# Patient Record
Sex: Female | Born: 1945 | Hispanic: Yes | Smoking: Never smoker
Health system: Southern US, Community
[De-identification: ages and names within clinical notes are randomized; demographics above are authoritative.]

## PROBLEM LIST (undated history)

## (undated) DIAGNOSIS — K8689 Other specified diseases of pancreas: Secondary | ICD-10-CM

## (undated) DIAGNOSIS — A0472 Enterocolitis due to Clostridium difficile, not specified as recurrent: Secondary | ICD-10-CM

## (undated) DIAGNOSIS — B269 Mumps without complication: Secondary | ICD-10-CM

## (undated) DIAGNOSIS — Z8489 Family history of other specified conditions: Secondary | ICD-10-CM

## (undated) DIAGNOSIS — M109 Gout, unspecified: Secondary | ICD-10-CM

## (undated) DIAGNOSIS — E039 Hypothyroidism, unspecified: Secondary | ICD-10-CM

## (undated) DIAGNOSIS — L719 Rosacea, unspecified: Secondary | ICD-10-CM

## (undated) DIAGNOSIS — Z9889 Other specified postprocedural states: Secondary | ICD-10-CM

## (undated) DIAGNOSIS — R7303 Prediabetes: Secondary | ICD-10-CM

## (undated) DIAGNOSIS — K5792 Diverticulitis of intestine, part unspecified, without perforation or abscess without bleeding: Principal | ICD-10-CM

## (undated) DIAGNOSIS — K219 Gastro-esophageal reflux disease without esophagitis: Secondary | ICD-10-CM

## (undated) DIAGNOSIS — E785 Hyperlipidemia, unspecified: Secondary | ICD-10-CM

## (undated) DIAGNOSIS — H269 Unspecified cataract: Secondary | ICD-10-CM

## (undated) DIAGNOSIS — M3500 Sicca syndrome, unspecified: Secondary | ICD-10-CM

## (undated) DIAGNOSIS — M1990 Unspecified osteoarthritis, unspecified site: Secondary | ICD-10-CM

## (undated) DIAGNOSIS — H04129 Dry eye syndrome of unspecified lacrimal gland: Secondary | ICD-10-CM

## (undated) DIAGNOSIS — T8859XA Other complications of anesthesia, initial encounter: Secondary | ICD-10-CM

## (undated) DIAGNOSIS — I10 Essential (primary) hypertension: Secondary | ICD-10-CM

## (undated) DIAGNOSIS — D649 Anemia, unspecified: Secondary | ICD-10-CM

## (undated) DIAGNOSIS — M419 Scoliosis, unspecified: Secondary | ICD-10-CM

## (undated) DIAGNOSIS — B54 Unspecified malaria: Secondary | ICD-10-CM

## (undated) DIAGNOSIS — H409 Unspecified glaucoma: Secondary | ICD-10-CM

## (undated) DIAGNOSIS — K449 Diaphragmatic hernia without obstruction or gangrene: Secondary | ICD-10-CM

## (undated) DIAGNOSIS — R112 Nausea with vomiting, unspecified: Secondary | ICD-10-CM

## (undated) HISTORY — PX: OTHER SURGICAL HISTORY: SHX169

## (undated) HISTORY — PX: JOINT REPLACEMENT: SHX530

## (undated) HISTORY — DX: Unspecified cataract: H26.9

## (undated) HISTORY — PX: CHOLECYSTECTOMY: SHX55

## (undated) HISTORY — DX: Enterocolitis due to Clostridium difficile, not specified as recurrent: A04.72

## (undated) HISTORY — DX: Hyperlipidemia, unspecified: E78.5

## (undated) HISTORY — PX: EYE SURGERY: SHX253

## (undated) HISTORY — DX: Diaphragmatic hernia without obstruction or gangrene: K44.9

## (undated) HISTORY — PX: CATARACT EXTRACTION: SUR2

## (undated) HISTORY — DX: Gastro-esophageal reflux disease without esophagitis: K21.9

---

## 1984-03-21 DIAGNOSIS — K759 Inflammatory liver disease, unspecified: Secondary | ICD-10-CM

## 1984-03-21 HISTORY — DX: Inflammatory liver disease, unspecified: K75.9

## 1986-03-21 DIAGNOSIS — G510 Bell's palsy: Secondary | ICD-10-CM

## 1986-03-21 HISTORY — DX: Bell's palsy: G51.0

## 1993-03-21 HISTORY — PX: ABDOMINAL HYSTERECTOMY: SHX81

## 1993-03-21 HISTORY — PX: APPENDECTOMY: SHX54

## 2010-03-11 ENCOUNTER — Encounter
Admission: RE | Admit: 2010-03-11 | Discharge: 2010-03-11 | Payer: Self-pay | Source: Home / Self Care | Attending: Orthopedic Surgery | Admitting: Orthopedic Surgery

## 2010-03-21 HISTORY — PX: JOINT REPLACEMENT: SHX530

## 2010-12-27 HISTORY — PX: KNEE ARTHROPLASTY: SHX992

## 2011-03-22 HISTORY — PX: CHOLECYSTECTOMY: SHX55

## 2012-01-16 HISTORY — PX: CHOLECYSTECTOMY: SHX55

## 2012-03-21 HISTORY — PX: OTHER SURGICAL HISTORY: SHX169

## 2012-11-27 HISTORY — PX: OTHER SURGICAL HISTORY: SHX169

## 2014-02-05 HISTORY — PX: OTHER SURGICAL HISTORY: SHX169

## 2015-08-10 HISTORY — PX: KNEE ARTHROPLASTY: SHX992

## 2016-09-05 DIAGNOSIS — R109 Unspecified abdominal pain: Secondary | ICD-10-CM | POA: Diagnosis not present

## 2016-09-05 DIAGNOSIS — K5909 Other constipation: Secondary | ICD-10-CM | POA: Diagnosis not present

## 2016-09-05 DIAGNOSIS — Z1389 Encounter for screening for other disorder: Secondary | ICD-10-CM | POA: Diagnosis not present

## 2016-09-05 DIAGNOSIS — M1990 Unspecified osteoarthritis, unspecified site: Secondary | ICD-10-CM | POA: Diagnosis not present

## 2016-09-05 DIAGNOSIS — E038 Other specified hypothyroidism: Secondary | ICD-10-CM | POA: Diagnosis not present

## 2016-09-05 DIAGNOSIS — Z6836 Body mass index (BMI) 36.0-36.9, adult: Secondary | ICD-10-CM | POA: Diagnosis not present

## 2016-09-05 DIAGNOSIS — Z79890 Hormone replacement therapy (postmenopausal): Secondary | ICD-10-CM | POA: Diagnosis not present

## 2016-09-15 DIAGNOSIS — M7751 Other enthesopathy of right foot: Secondary | ICD-10-CM | POA: Diagnosis not present

## 2016-09-15 DIAGNOSIS — M659 Synovitis and tenosynovitis, unspecified: Secondary | ICD-10-CM | POA: Diagnosis not present

## 2016-10-24 DIAGNOSIS — H401131 Primary open-angle glaucoma, bilateral, mild stage: Secondary | ICD-10-CM | POA: Diagnosis not present

## 2016-10-24 DIAGNOSIS — H524 Presbyopia: Secondary | ICD-10-CM | POA: Diagnosis not present

## 2016-10-31 DIAGNOSIS — H10413 Chronic giant papillary conjunctivitis, bilateral: Secondary | ICD-10-CM | POA: Diagnosis not present

## 2016-10-31 DIAGNOSIS — H04123 Dry eye syndrome of bilateral lacrimal glands: Secondary | ICD-10-CM | POA: Diagnosis not present

## 2016-11-15 DIAGNOSIS — H04123 Dry eye syndrome of bilateral lacrimal glands: Secondary | ICD-10-CM | POA: Diagnosis not present

## 2016-11-29 DIAGNOSIS — H10413 Chronic giant papillary conjunctivitis, bilateral: Secondary | ICD-10-CM | POA: Diagnosis not present

## 2016-11-29 DIAGNOSIS — H04123 Dry eye syndrome of bilateral lacrimal glands: Secondary | ICD-10-CM | POA: Diagnosis not present

## 2016-12-21 DIAGNOSIS — L600 Ingrowing nail: Secondary | ICD-10-CM | POA: Diagnosis not present

## 2016-12-27 DIAGNOSIS — H10413 Chronic giant papillary conjunctivitis, bilateral: Secondary | ICD-10-CM | POA: Diagnosis not present

## 2016-12-27 DIAGNOSIS — H04123 Dry eye syndrome of bilateral lacrimal glands: Secondary | ICD-10-CM | POA: Diagnosis not present

## 2017-01-23 ENCOUNTER — Other Ambulatory Visit: Payer: Self-pay | Admitting: Internal Medicine

## 2017-01-23 DIAGNOSIS — Z139 Encounter for screening, unspecified: Secondary | ICD-10-CM

## 2017-01-26 ENCOUNTER — Ambulatory Visit
Admission: RE | Admit: 2017-01-26 | Discharge: 2017-01-26 | Disposition: A | Discharge status: Home / Self Care | Payer: Medicare HMO | Source: Ambulatory Visit | Attending: Internal Medicine | Admitting: Internal Medicine

## 2017-01-26 DIAGNOSIS — Z139 Encounter for screening, unspecified: Secondary | ICD-10-CM

## 2017-01-26 DIAGNOSIS — Z1231 Encounter for screening mammogram for malignant neoplasm of breast: Secondary | ICD-10-CM | POA: Diagnosis not present

## 2017-02-17 DIAGNOSIS — E7849 Other hyperlipidemia: Secondary | ICD-10-CM | POA: Diagnosis not present

## 2017-02-17 DIAGNOSIS — E038 Other specified hypothyroidism: Secondary | ICD-10-CM | POA: Diagnosis not present

## 2017-02-17 DIAGNOSIS — R1084 Generalized abdominal pain: Secondary | ICD-10-CM | POA: Diagnosis not present

## 2017-02-17 DIAGNOSIS — Z6835 Body mass index (BMI) 35.0-35.9, adult: Secondary | ICD-10-CM | POA: Diagnosis not present

## 2017-02-17 DIAGNOSIS — L298 Other pruritus: Secondary | ICD-10-CM | POA: Diagnosis not present

## 2017-02-17 DIAGNOSIS — M1990 Unspecified osteoarthritis, unspecified site: Secondary | ICD-10-CM | POA: Diagnosis not present

## 2017-02-17 DIAGNOSIS — Z79890 Hormone replacement therapy (postmenopausal): Secondary | ICD-10-CM | POA: Diagnosis not present

## 2017-02-17 DIAGNOSIS — K5909 Other constipation: Secondary | ICD-10-CM | POA: Diagnosis not present

## 2017-02-20 DIAGNOSIS — H401131 Primary open-angle glaucoma, bilateral, mild stage: Secondary | ICD-10-CM | POA: Diagnosis not present

## 2017-02-20 DIAGNOSIS — Z79890 Hormone replacement therapy (postmenopausal): Secondary | ICD-10-CM | POA: Diagnosis not present

## 2017-02-20 DIAGNOSIS — E038 Other specified hypothyroidism: Secondary | ICD-10-CM | POA: Diagnosis not present

## 2017-02-21 DIAGNOSIS — E7849 Other hyperlipidemia: Secondary | ICD-10-CM | POA: Diagnosis not present

## 2017-04-04 DIAGNOSIS — Z1212 Encounter for screening for malignant neoplasm of rectum: Secondary | ICD-10-CM | POA: Diagnosis not present

## 2017-04-14 DIAGNOSIS — H1132 Conjunctival hemorrhage, left eye: Secondary | ICD-10-CM | POA: Diagnosis not present

## 2017-04-20 DIAGNOSIS — R208 Other disturbances of skin sensation: Secondary | ICD-10-CM | POA: Diagnosis not present

## 2017-04-20 DIAGNOSIS — L578 Other skin changes due to chronic exposure to nonionizing radiation: Secondary | ICD-10-CM | POA: Diagnosis not present

## 2017-04-20 DIAGNOSIS — D0471 Carcinoma in situ of skin of right lower limb, including hip: Secondary | ICD-10-CM | POA: Diagnosis not present

## 2017-04-20 DIAGNOSIS — D692 Other nonthrombocytopenic purpura: Secondary | ICD-10-CM | POA: Diagnosis not present

## 2017-04-20 DIAGNOSIS — D225 Melanocytic nevi of trunk: Secondary | ICD-10-CM | POA: Diagnosis not present

## 2017-04-20 DIAGNOSIS — L309 Dermatitis, unspecified: Secondary | ICD-10-CM | POA: Diagnosis not present

## 2017-04-20 DIAGNOSIS — L821 Other seborrheic keratosis: Secondary | ICD-10-CM | POA: Diagnosis not present

## 2017-04-28 DIAGNOSIS — D0471 Carcinoma in situ of skin of right lower limb, including hip: Secondary | ICD-10-CM | POA: Diagnosis not present

## 2017-06-22 DIAGNOSIS — H04123 Dry eye syndrome of bilateral lacrimal glands: Secondary | ICD-10-CM | POA: Diagnosis not present

## 2017-07-21 DIAGNOSIS — G5790 Unspecified mononeuropathy of unspecified lower limb: Secondary | ICD-10-CM | POA: Diagnosis not present

## 2017-08-21 DIAGNOSIS — E038 Other specified hypothyroidism: Secondary | ICD-10-CM | POA: Diagnosis not present

## 2017-08-21 DIAGNOSIS — R82998 Other abnormal findings in urine: Secondary | ICD-10-CM | POA: Diagnosis not present

## 2017-08-21 DIAGNOSIS — E7849 Other hyperlipidemia: Secondary | ICD-10-CM | POA: Diagnosis not present

## 2017-08-28 DIAGNOSIS — Z1389 Encounter for screening for other disorder: Secondary | ICD-10-CM | POA: Diagnosis not present

## 2017-08-28 DIAGNOSIS — D72819 Decreased white blood cell count, unspecified: Secondary | ICD-10-CM | POA: Diagnosis not present

## 2017-08-28 DIAGNOSIS — Z0000 Encounter for general adult medical examination without abnormal findings: Secondary | ICD-10-CM | POA: Diagnosis not present

## 2017-08-28 DIAGNOSIS — Z79890 Hormone replacement therapy (postmenopausal): Secondary | ICD-10-CM | POA: Diagnosis not present

## 2017-08-28 DIAGNOSIS — E038 Other specified hypothyroidism: Secondary | ICD-10-CM | POA: Diagnosis not present

## 2017-08-28 DIAGNOSIS — K5900 Constipation, unspecified: Secondary | ICD-10-CM | POA: Diagnosis not present

## 2017-08-28 DIAGNOSIS — M1990 Unspecified osteoarthritis, unspecified site: Secondary | ICD-10-CM | POA: Diagnosis not present

## 2017-08-28 DIAGNOSIS — Z85828 Personal history of other malignant neoplasm of skin: Secondary | ICD-10-CM | POA: Diagnosis not present

## 2017-08-28 DIAGNOSIS — R7301 Impaired fasting glucose: Secondary | ICD-10-CM | POA: Diagnosis not present

## 2017-08-28 DIAGNOSIS — E7849 Other hyperlipidemia: Secondary | ICD-10-CM | POA: Diagnosis not present

## 2017-08-28 DIAGNOSIS — L718 Other rosacea: Secondary | ICD-10-CM | POA: Diagnosis not present

## 2017-08-31 DIAGNOSIS — Z1212 Encounter for screening for malignant neoplasm of rectum: Secondary | ICD-10-CM | POA: Diagnosis not present

## 2017-09-11 DIAGNOSIS — M545 Low back pain: Secondary | ICD-10-CM | POA: Diagnosis not present

## 2017-09-25 DIAGNOSIS — M545 Low back pain: Secondary | ICD-10-CM | POA: Diagnosis not present

## 2017-09-29 DIAGNOSIS — M545 Low back pain: Secondary | ICD-10-CM | POA: Diagnosis not present

## 2017-10-02 DIAGNOSIS — M545 Low back pain: Secondary | ICD-10-CM | POA: Diagnosis not present

## 2017-10-04 DIAGNOSIS — M5416 Radiculopathy, lumbar region: Secondary | ICD-10-CM | POA: Diagnosis not present

## 2017-10-09 DIAGNOSIS — M5416 Radiculopathy, lumbar region: Secondary | ICD-10-CM | POA: Diagnosis not present

## 2017-10-12 DIAGNOSIS — M5416 Radiculopathy, lumbar region: Secondary | ICD-10-CM | POA: Diagnosis not present

## 2017-10-16 DIAGNOSIS — M5416 Radiculopathy, lumbar region: Secondary | ICD-10-CM | POA: Diagnosis not present

## 2017-10-18 DIAGNOSIS — M5416 Radiculopathy, lumbar region: Secondary | ICD-10-CM | POA: Diagnosis not present

## 2017-10-19 DIAGNOSIS — H401131 Primary open-angle glaucoma, bilateral, mild stage: Secondary | ICD-10-CM | POA: Diagnosis not present

## 2017-10-19 DIAGNOSIS — H5202 Hypermetropia, left eye: Secondary | ICD-10-CM | POA: Diagnosis not present

## 2017-10-23 DIAGNOSIS — M5416 Radiculopathy, lumbar region: Secondary | ICD-10-CM | POA: Diagnosis not present

## 2017-10-25 DIAGNOSIS — M5416 Radiculopathy, lumbar region: Secondary | ICD-10-CM | POA: Diagnosis not present

## 2017-10-30 DIAGNOSIS — M5416 Radiculopathy, lumbar region: Secondary | ICD-10-CM | POA: Diagnosis not present

## 2017-11-06 DIAGNOSIS — M5416 Radiculopathy, lumbar region: Secondary | ICD-10-CM | POA: Diagnosis not present

## 2017-11-16 DIAGNOSIS — M5416 Radiculopathy, lumbar region: Secondary | ICD-10-CM | POA: Diagnosis not present

## 2017-11-22 DIAGNOSIS — M5416 Radiculopathy, lumbar region: Secondary | ICD-10-CM | POA: Diagnosis not present

## 2017-11-29 DIAGNOSIS — M5416 Radiculopathy, lumbar region: Secondary | ICD-10-CM | POA: Diagnosis not present

## 2017-12-07 DIAGNOSIS — M5416 Radiculopathy, lumbar region: Secondary | ICD-10-CM | POA: Diagnosis not present

## 2018-01-26 ENCOUNTER — Other Ambulatory Visit: Payer: Self-pay | Admitting: Internal Medicine

## 2018-01-26 DIAGNOSIS — Z1231 Encounter for screening mammogram for malignant neoplasm of breast: Secondary | ICD-10-CM

## 2018-02-28 DIAGNOSIS — R7301 Impaired fasting glucose: Secondary | ICD-10-CM | POA: Diagnosis not present

## 2018-02-28 DIAGNOSIS — E7849 Other hyperlipidemia: Secondary | ICD-10-CM | POA: Diagnosis not present

## 2018-03-01 DIAGNOSIS — H401131 Primary open-angle glaucoma, bilateral, mild stage: Secondary | ICD-10-CM | POA: Diagnosis not present

## 2018-03-02 DIAGNOSIS — M79602 Pain in left arm: Secondary | ICD-10-CM | POA: Diagnosis not present

## 2018-03-02 DIAGNOSIS — Z6834 Body mass index (BMI) 34.0-34.9, adult: Secondary | ICD-10-CM | POA: Diagnosis not present

## 2018-03-02 DIAGNOSIS — M1990 Unspecified osteoarthritis, unspecified site: Secondary | ICD-10-CM | POA: Diagnosis not present

## 2018-03-02 DIAGNOSIS — E7849 Other hyperlipidemia: Secondary | ICD-10-CM | POA: Diagnosis not present

## 2018-03-02 DIAGNOSIS — R7301 Impaired fasting glucose: Secondary | ICD-10-CM | POA: Diagnosis not present

## 2018-03-07 DIAGNOSIS — D72819 Decreased white blood cell count, unspecified: Secondary | ICD-10-CM | POA: Diagnosis not present

## 2018-03-07 DIAGNOSIS — M79602 Pain in left arm: Secondary | ICD-10-CM | POA: Diagnosis not present

## 2018-03-08 ENCOUNTER — Ambulatory Visit
Admission: RE | Admit: 2018-03-08 | Discharge: 2018-03-08 | Disposition: A | Discharge status: Home / Self Care | Payer: Medicare HMO | Source: Ambulatory Visit | Attending: Internal Medicine | Admitting: Internal Medicine

## 2018-03-08 ENCOUNTER — Encounter: Payer: Self-pay | Admitting: Radiology

## 2018-03-08 DIAGNOSIS — Z1231 Encounter for screening mammogram for malignant neoplasm of breast: Secondary | ICD-10-CM | POA: Diagnosis not present

## 2018-03-22 DIAGNOSIS — M13811 Other specified arthritis, right shoulder: Secondary | ICD-10-CM | POA: Diagnosis not present

## 2018-03-22 DIAGNOSIS — M25512 Pain in left shoulder: Secondary | ICD-10-CM | POA: Diagnosis not present

## 2018-04-30 DIAGNOSIS — L821 Other seborrheic keratosis: Secondary | ICD-10-CM | POA: Diagnosis not present

## 2018-04-30 DIAGNOSIS — H61021 Chronic perichondritis of right external ear: Secondary | ICD-10-CM | POA: Diagnosis not present

## 2018-04-30 DIAGNOSIS — D1801 Hemangioma of skin and subcutaneous tissue: Secondary | ICD-10-CM | POA: Diagnosis not present

## 2018-04-30 DIAGNOSIS — L814 Other melanin hyperpigmentation: Secondary | ICD-10-CM | POA: Diagnosis not present

## 2018-04-30 DIAGNOSIS — Z85828 Personal history of other malignant neoplasm of skin: Secondary | ICD-10-CM | POA: Diagnosis not present

## 2018-04-30 DIAGNOSIS — L853 Xerosis cutis: Secondary | ICD-10-CM | POA: Diagnosis not present

## 2018-05-03 DIAGNOSIS — E7849 Other hyperlipidemia: Secondary | ICD-10-CM | POA: Diagnosis not present

## 2018-08-27 DIAGNOSIS — E038 Other specified hypothyroidism: Secondary | ICD-10-CM | POA: Diagnosis not present

## 2018-08-27 DIAGNOSIS — E7849 Other hyperlipidemia: Secondary | ICD-10-CM | POA: Diagnosis not present

## 2018-08-27 DIAGNOSIS — R7301 Impaired fasting glucose: Secondary | ICD-10-CM | POA: Diagnosis not present

## 2018-08-30 DIAGNOSIS — R82998 Other abnormal findings in urine: Secondary | ICD-10-CM | POA: Diagnosis not present

## 2018-09-03 DIAGNOSIS — Z79890 Hormone replacement therapy (postmenopausal): Secondary | ICD-10-CM | POA: Diagnosis not present

## 2018-09-03 DIAGNOSIS — E039 Hypothyroidism, unspecified: Secondary | ICD-10-CM | POA: Diagnosis not present

## 2018-09-03 DIAGNOSIS — Z0000 Encounter for general adult medical examination without abnormal findings: Secondary | ICD-10-CM | POA: Diagnosis not present

## 2018-09-03 DIAGNOSIS — E785 Hyperlipidemia, unspecified: Secondary | ICD-10-CM | POA: Diagnosis not present

## 2018-09-03 DIAGNOSIS — K649 Unspecified hemorrhoids: Secondary | ICD-10-CM | POA: Diagnosis not present

## 2018-09-03 DIAGNOSIS — M1990 Unspecified osteoarthritis, unspecified site: Secondary | ICD-10-CM | POA: Diagnosis not present

## 2018-09-03 DIAGNOSIS — R7301 Impaired fasting glucose: Secondary | ICD-10-CM | POA: Diagnosis not present

## 2018-09-03 DIAGNOSIS — K5900 Constipation, unspecified: Secondary | ICD-10-CM | POA: Diagnosis not present

## 2018-09-03 DIAGNOSIS — D72819 Decreased white blood cell count, unspecified: Secondary | ICD-10-CM | POA: Diagnosis not present

## 2018-09-07 DIAGNOSIS — H04123 Dry eye syndrome of bilateral lacrimal glands: Secondary | ICD-10-CM | POA: Diagnosis not present

## 2018-09-07 DIAGNOSIS — H401131 Primary open-angle glaucoma, bilateral, mild stage: Secondary | ICD-10-CM | POA: Diagnosis not present

## 2018-09-20 DIAGNOSIS — R109 Unspecified abdominal pain: Secondary | ICD-10-CM | POA: Diagnosis not present

## 2018-09-20 DIAGNOSIS — K219 Gastro-esophageal reflux disease without esophagitis: Secondary | ICD-10-CM | POA: Diagnosis not present

## 2018-09-20 DIAGNOSIS — Z800 Family history of malignant neoplasm of digestive organs: Secondary | ICD-10-CM | POA: Diagnosis not present

## 2018-09-20 DIAGNOSIS — R197 Diarrhea, unspecified: Secondary | ICD-10-CM | POA: Diagnosis not present

## 2018-09-20 DIAGNOSIS — K5900 Constipation, unspecified: Secondary | ICD-10-CM | POA: Diagnosis not present

## 2018-10-03 DIAGNOSIS — K219 Gastro-esophageal reflux disease without esophagitis: Secondary | ICD-10-CM | POA: Diagnosis not present

## 2018-10-03 DIAGNOSIS — K5900 Constipation, unspecified: Secondary | ICD-10-CM | POA: Diagnosis not present

## 2018-10-03 DIAGNOSIS — R197 Diarrhea, unspecified: Secondary | ICD-10-CM | POA: Diagnosis not present

## 2018-10-03 DIAGNOSIS — Z800 Family history of malignant neoplasm of digestive organs: Secondary | ICD-10-CM | POA: Diagnosis not present

## 2018-10-29 DIAGNOSIS — R109 Unspecified abdominal pain: Secondary | ICD-10-CM | POA: Diagnosis not present

## 2018-11-01 DIAGNOSIS — M19012 Primary osteoarthritis, left shoulder: Secondary | ICD-10-CM | POA: Diagnosis not present

## 2018-11-01 DIAGNOSIS — M25512 Pain in left shoulder: Secondary | ICD-10-CM | POA: Diagnosis not present

## 2018-11-07 DIAGNOSIS — E7849 Other hyperlipidemia: Secondary | ICD-10-CM | POA: Diagnosis not present

## 2018-11-07 DIAGNOSIS — M3500 Sicca syndrome, unspecified: Secondary | ICD-10-CM | POA: Diagnosis not present

## 2018-12-07 ENCOUNTER — Other Ambulatory Visit: Payer: Self-pay | Admitting: Internal Medicine

## 2018-12-07 DIAGNOSIS — Z1231 Encounter for screening mammogram for malignant neoplasm of breast: Secondary | ICD-10-CM

## 2018-12-11 DIAGNOSIS — H401131 Primary open-angle glaucoma, bilateral, mild stage: Secondary | ICD-10-CM | POA: Diagnosis not present

## 2018-12-24 DIAGNOSIS — E7849 Other hyperlipidemia: Secondary | ICD-10-CM | POA: Diagnosis not present

## 2018-12-24 DIAGNOSIS — Z23 Encounter for immunization: Secondary | ICD-10-CM | POA: Diagnosis not present

## 2019-01-02 DIAGNOSIS — K219 Gastro-esophageal reflux disease without esophagitis: Secondary | ICD-10-CM | POA: Diagnosis not present

## 2019-01-02 DIAGNOSIS — R197 Diarrhea, unspecified: Secondary | ICD-10-CM | POA: Diagnosis not present

## 2019-01-28 DIAGNOSIS — K219 Gastro-esophageal reflux disease without esophagitis: Secondary | ICD-10-CM | POA: Diagnosis not present

## 2019-01-28 DIAGNOSIS — R197 Diarrhea, unspecified: Secondary | ICD-10-CM | POA: Diagnosis not present

## 2019-02-26 DIAGNOSIS — M3500 Sicca syndrome, unspecified: Secondary | ICD-10-CM | POA: Diagnosis not present

## 2019-02-26 DIAGNOSIS — Z79899 Other long term (current) drug therapy: Secondary | ICD-10-CM | POA: Diagnosis not present

## 2019-02-26 DIAGNOSIS — Z79890 Hormone replacement therapy (postmenopausal): Secondary | ICD-10-CM | POA: Diagnosis not present

## 2019-02-26 DIAGNOSIS — M1990 Unspecified osteoarthritis, unspecified site: Secondary | ICD-10-CM | POA: Diagnosis not present

## 2019-03-01 DIAGNOSIS — E7849 Other hyperlipidemia: Secondary | ICD-10-CM | POA: Diagnosis not present

## 2019-03-01 DIAGNOSIS — R7301 Impaired fasting glucose: Secondary | ICD-10-CM | POA: Diagnosis not present

## 2019-03-01 DIAGNOSIS — E038 Other specified hypothyroidism: Secondary | ICD-10-CM | POA: Diagnosis not present

## 2019-03-04 DIAGNOSIS — D72819 Decreased white blood cell count, unspecified: Secondary | ICD-10-CM | POA: Diagnosis not present

## 2019-03-04 DIAGNOSIS — R7301 Impaired fasting glucose: Secondary | ICD-10-CM | POA: Diagnosis not present

## 2019-03-04 DIAGNOSIS — E785 Hyperlipidemia, unspecified: Secondary | ICD-10-CM | POA: Diagnosis not present

## 2019-03-04 DIAGNOSIS — E039 Hypothyroidism, unspecified: Secondary | ICD-10-CM | POA: Diagnosis not present

## 2019-03-04 DIAGNOSIS — K219 Gastro-esophageal reflux disease without esophagitis: Secondary | ICD-10-CM | POA: Diagnosis not present

## 2019-03-04 DIAGNOSIS — M3500 Sicca syndrome, unspecified: Secondary | ICD-10-CM | POA: Diagnosis not present

## 2019-03-04 DIAGNOSIS — M1990 Unspecified osteoarthritis, unspecified site: Secondary | ICD-10-CM | POA: Diagnosis not present

## 2019-03-08 DIAGNOSIS — D72819 Decreased white blood cell count, unspecified: Secondary | ICD-10-CM | POA: Diagnosis not present

## 2019-03-11 ENCOUNTER — Ambulatory Visit
Admission: RE | Admit: 2019-03-11 | Discharge: 2019-03-11 | Disposition: A | Discharge status: Home / Self Care | Payer: Medicare HMO | Source: Ambulatory Visit | Attending: Internal Medicine | Admitting: Internal Medicine

## 2019-03-11 ENCOUNTER — Other Ambulatory Visit: Payer: Self-pay

## 2019-03-11 DIAGNOSIS — Z1231 Encounter for screening mammogram for malignant neoplasm of breast: Secondary | ICD-10-CM

## 2019-03-28 DIAGNOSIS — M79672 Pain in left foot: Secondary | ICD-10-CM | POA: Diagnosis not present

## 2019-03-28 DIAGNOSIS — M79671 Pain in right foot: Secondary | ICD-10-CM | POA: Diagnosis not present

## 2019-04-12 DIAGNOSIS — H401131 Primary open-angle glaucoma, bilateral, mild stage: Secondary | ICD-10-CM | POA: Diagnosis not present

## 2019-04-22 DIAGNOSIS — D8989 Other specified disorders involving the immune mechanism, not elsewhere classified: Secondary | ICD-10-CM | POA: Diagnosis not present

## 2019-04-22 DIAGNOSIS — Z6835 Body mass index (BMI) 35.0-35.9, adult: Secondary | ICD-10-CM | POA: Diagnosis not present

## 2019-04-22 DIAGNOSIS — E669 Obesity, unspecified: Secondary | ICD-10-CM | POA: Diagnosis not present

## 2019-04-22 DIAGNOSIS — M150 Primary generalized (osteo)arthritis: Secondary | ICD-10-CM | POA: Diagnosis not present

## 2019-04-22 DIAGNOSIS — M5136 Other intervertebral disc degeneration, lumbar region: Secondary | ICD-10-CM | POA: Diagnosis not present

## 2019-04-22 DIAGNOSIS — M2550 Pain in unspecified joint: Secondary | ICD-10-CM | POA: Diagnosis not present

## 2019-05-07 DIAGNOSIS — L905 Scar conditions and fibrosis of skin: Secondary | ICD-10-CM | POA: Diagnosis not present

## 2019-05-07 DIAGNOSIS — L298 Other pruritus: Secondary | ICD-10-CM | POA: Diagnosis not present

## 2019-05-07 DIAGNOSIS — L720 Epidermal cyst: Secondary | ICD-10-CM | POA: Diagnosis not present

## 2019-05-07 DIAGNOSIS — H61021 Chronic perichondritis of right external ear: Secondary | ICD-10-CM | POA: Diagnosis not present

## 2019-05-07 DIAGNOSIS — Z85828 Personal history of other malignant neoplasm of skin: Secondary | ICD-10-CM | POA: Diagnosis not present

## 2019-05-07 DIAGNOSIS — L821 Other seborrheic keratosis: Secondary | ICD-10-CM | POA: Diagnosis not present

## 2019-05-07 DIAGNOSIS — L570 Actinic keratosis: Secondary | ICD-10-CM | POA: Diagnosis not present

## 2019-05-13 DIAGNOSIS — M150 Primary generalized (osteo)arthritis: Secondary | ICD-10-CM | POA: Diagnosis not present

## 2019-05-13 DIAGNOSIS — Z6835 Body mass index (BMI) 35.0-35.9, adult: Secondary | ICD-10-CM | POA: Diagnosis not present

## 2019-05-13 DIAGNOSIS — E669 Obesity, unspecified: Secondary | ICD-10-CM | POA: Diagnosis not present

## 2019-05-13 DIAGNOSIS — M359 Systemic involvement of connective tissue, unspecified: Secondary | ICD-10-CM | POA: Diagnosis not present

## 2019-06-03 DIAGNOSIS — R112 Nausea with vomiting, unspecified: Secondary | ICD-10-CM | POA: Diagnosis not present

## 2019-06-03 DIAGNOSIS — Z800 Family history of malignant neoplasm of digestive organs: Secondary | ICD-10-CM | POA: Diagnosis not present

## 2019-06-03 DIAGNOSIS — R1033 Periumbilical pain: Secondary | ICD-10-CM | POA: Diagnosis not present

## 2019-06-03 DIAGNOSIS — K219 Gastro-esophageal reflux disease without esophagitis: Secondary | ICD-10-CM | POA: Diagnosis not present

## 2019-06-11 DIAGNOSIS — K5730 Diverticulosis of large intestine without perforation or abscess without bleeding: Secondary | ICD-10-CM | POA: Diagnosis not present

## 2019-06-11 DIAGNOSIS — K449 Diaphragmatic hernia without obstruction or gangrene: Secondary | ICD-10-CM | POA: Diagnosis not present

## 2019-06-11 DIAGNOSIS — Z800 Family history of malignant neoplasm of digestive organs: Secondary | ICD-10-CM | POA: Diagnosis not present

## 2019-06-11 DIAGNOSIS — Z8371 Family history of colonic polyps: Secondary | ICD-10-CM | POA: Diagnosis not present

## 2019-06-11 DIAGNOSIS — K635 Polyp of colon: Secondary | ICD-10-CM | POA: Diagnosis not present

## 2019-06-11 DIAGNOSIS — K219 Gastro-esophageal reflux disease without esophagitis: Secondary | ICD-10-CM | POA: Diagnosis not present

## 2019-06-11 DIAGNOSIS — R12 Heartburn: Secondary | ICD-10-CM | POA: Diagnosis not present

## 2019-06-11 DIAGNOSIS — D123 Benign neoplasm of transverse colon: Secondary | ICD-10-CM | POA: Diagnosis not present

## 2019-07-08 DIAGNOSIS — R142 Eructation: Secondary | ICD-10-CM | POA: Diagnosis not present

## 2019-08-13 DIAGNOSIS — H401131 Primary open-angle glaucoma, bilateral, mild stage: Secondary | ICD-10-CM | POA: Diagnosis not present

## 2019-08-27 DIAGNOSIS — Z0000 Encounter for general adult medical examination without abnormal findings: Secondary | ICD-10-CM | POA: Diagnosis not present

## 2019-08-27 DIAGNOSIS — E7849 Other hyperlipidemia: Secondary | ICD-10-CM | POA: Diagnosis not present

## 2019-08-27 DIAGNOSIS — R7301 Impaired fasting glucose: Secondary | ICD-10-CM | POA: Diagnosis not present

## 2019-08-27 DIAGNOSIS — E038 Other specified hypothyroidism: Secondary | ICD-10-CM | POA: Diagnosis not present

## 2019-09-04 DIAGNOSIS — Z79890 Hormone replacement therapy (postmenopausal): Secondary | ICD-10-CM | POA: Diagnosis not present

## 2019-09-04 DIAGNOSIS — Z0000 Encounter for general adult medical examination without abnormal findings: Secondary | ICD-10-CM | POA: Diagnosis not present

## 2019-09-04 DIAGNOSIS — M1990 Unspecified osteoarthritis, unspecified site: Secondary | ICD-10-CM | POA: Diagnosis not present

## 2019-09-04 DIAGNOSIS — R109 Unspecified abdominal pain: Secondary | ICD-10-CM | POA: Diagnosis not present

## 2019-09-04 DIAGNOSIS — E785 Hyperlipidemia, unspecified: Secondary | ICD-10-CM | POA: Diagnosis not present

## 2019-09-04 DIAGNOSIS — D72819 Decreased white blood cell count, unspecified: Secondary | ICD-10-CM | POA: Diagnosis not present

## 2019-09-04 DIAGNOSIS — R82998 Other abnormal findings in urine: Secondary | ICD-10-CM | POA: Diagnosis not present

## 2019-09-04 DIAGNOSIS — E039 Hypothyroidism, unspecified: Secondary | ICD-10-CM | POA: Diagnosis not present

## 2019-09-04 DIAGNOSIS — R7301 Impaired fasting glucose: Secondary | ICD-10-CM | POA: Diagnosis not present

## 2019-09-04 DIAGNOSIS — M3500 Sicca syndrome, unspecified: Secondary | ICD-10-CM | POA: Diagnosis not present

## 2019-09-10 DIAGNOSIS — M150 Primary generalized (osteo)arthritis: Secondary | ICD-10-CM | POA: Diagnosis not present

## 2019-09-10 DIAGNOSIS — M359 Systemic involvement of connective tissue, unspecified: Secondary | ICD-10-CM | POA: Diagnosis not present

## 2019-09-10 DIAGNOSIS — Z6833 Body mass index (BMI) 33.0-33.9, adult: Secondary | ICD-10-CM | POA: Diagnosis not present

## 2019-09-10 DIAGNOSIS — E669 Obesity, unspecified: Secondary | ICD-10-CM | POA: Diagnosis not present

## 2019-11-14 DIAGNOSIS — M25561 Pain in right knee: Secondary | ICD-10-CM | POA: Diagnosis not present

## 2019-11-14 DIAGNOSIS — M25562 Pain in left knee: Secondary | ICD-10-CM | POA: Diagnosis not present

## 2019-11-14 DIAGNOSIS — M25512 Pain in left shoulder: Secondary | ICD-10-CM | POA: Diagnosis not present

## 2019-11-14 DIAGNOSIS — M19012 Primary osteoarthritis, left shoulder: Secondary | ICD-10-CM | POA: Diagnosis not present

## 2019-11-14 DIAGNOSIS — Z96653 Presence of artificial knee joint, bilateral: Secondary | ICD-10-CM | POA: Diagnosis not present

## 2019-11-18 DIAGNOSIS — H401131 Primary open-angle glaucoma, bilateral, mild stage: Secondary | ICD-10-CM | POA: Diagnosis not present

## 2019-11-18 DIAGNOSIS — D23111 Other benign neoplasm of skin of right upper eyelid, including canthus: Secondary | ICD-10-CM | POA: Diagnosis not present

## 2019-11-18 DIAGNOSIS — H5203 Hypermetropia, bilateral: Secondary | ICD-10-CM | POA: Diagnosis not present

## 2019-11-18 DIAGNOSIS — D23122 Other benign neoplasm of skin of left lower eyelid, including canthus: Secondary | ICD-10-CM | POA: Diagnosis not present

## 2019-12-02 DIAGNOSIS — D23122 Other benign neoplasm of skin of left lower eyelid, including canthus: Secondary | ICD-10-CM | POA: Diagnosis not present

## 2019-12-02 DIAGNOSIS — D23112 Other benign neoplasm of skin of right lower eyelid, including canthus: Secondary | ICD-10-CM | POA: Diagnosis not present

## 2020-02-03 ENCOUNTER — Other Ambulatory Visit: Payer: Self-pay | Admitting: Internal Medicine

## 2020-02-03 DIAGNOSIS — Z1231 Encounter for screening mammogram for malignant neoplasm of breast: Secondary | ICD-10-CM

## 2020-03-16 ENCOUNTER — Ambulatory Visit
Admission: RE | Admit: 2020-03-16 | Discharge: 2020-03-16 | Disposition: A | Discharge status: Home / Self Care | Payer: Medicare HMO | Source: Ambulatory Visit | Attending: Internal Medicine | Admitting: Internal Medicine

## 2020-03-16 DIAGNOSIS — Z1231 Encounter for screening mammogram for malignant neoplasm of breast: Secondary | ICD-10-CM | POA: Diagnosis not present

## 2020-03-17 DIAGNOSIS — H401131 Primary open-angle glaucoma, bilateral, mild stage: Secondary | ICD-10-CM | POA: Diagnosis not present

## 2020-03-27 DIAGNOSIS — M359 Systemic involvement of connective tissue, unspecified: Secondary | ICD-10-CM | POA: Diagnosis not present

## 2020-03-27 DIAGNOSIS — D72819 Decreased white blood cell count, unspecified: Secondary | ICD-10-CM | POA: Diagnosis not present

## 2020-03-27 DIAGNOSIS — E785 Hyperlipidemia, unspecified: Secondary | ICD-10-CM | POA: Diagnosis not present

## 2020-03-27 DIAGNOSIS — M150 Primary generalized (osteo)arthritis: Secondary | ICD-10-CM | POA: Diagnosis not present

## 2020-03-27 DIAGNOSIS — Z79890 Hormone replacement therapy (postmenopausal): Secondary | ICD-10-CM | POA: Diagnosis not present

## 2020-03-27 DIAGNOSIS — M3505 Sjogren syndrome with inflammatory arthritis: Secondary | ICD-10-CM | POA: Diagnosis not present

## 2020-03-27 DIAGNOSIS — K219 Gastro-esophageal reflux disease without esophagitis: Secondary | ICD-10-CM | POA: Diagnosis not present

## 2020-03-27 DIAGNOSIS — G5793 Unspecified mononeuropathy of bilateral lower limbs: Secondary | ICD-10-CM | POA: Diagnosis not present

## 2020-03-27 DIAGNOSIS — Z6833 Body mass index (BMI) 33.0-33.9, adult: Secondary | ICD-10-CM | POA: Diagnosis not present

## 2020-03-27 DIAGNOSIS — R7301 Impaired fasting glucose: Secondary | ICD-10-CM | POA: Diagnosis not present

## 2020-03-27 DIAGNOSIS — M1990 Unspecified osteoarthritis, unspecified site: Secondary | ICD-10-CM | POA: Diagnosis not present

## 2020-03-27 DIAGNOSIS — R109 Unspecified abdominal pain: Secondary | ICD-10-CM | POA: Diagnosis not present

## 2020-03-27 DIAGNOSIS — E669 Obesity, unspecified: Secondary | ICD-10-CM | POA: Diagnosis not present

## 2020-03-27 DIAGNOSIS — E039 Hypothyroidism, unspecified: Secondary | ICD-10-CM | POA: Diagnosis not present

## 2020-04-24 DIAGNOSIS — K279 Peptic ulcer, site unspecified, unspecified as acute or chronic, without hemorrhage or perforation: Secondary | ICD-10-CM | POA: Insufficient documentation

## 2020-04-24 DIAGNOSIS — N819 Female genital prolapse, unspecified: Secondary | ICD-10-CM | POA: Diagnosis not present

## 2020-04-24 DIAGNOSIS — I10 Essential (primary) hypertension: Secondary | ICD-10-CM | POA: Insufficient documentation

## 2020-04-24 DIAGNOSIS — E039 Hypothyroidism, unspecified: Secondary | ICD-10-CM | POA: Insufficient documentation

## 2020-04-24 DIAGNOSIS — M1990 Unspecified osteoarthritis, unspecified site: Secondary | ICD-10-CM | POA: Insufficient documentation

## 2020-04-24 DIAGNOSIS — B179 Acute viral hepatitis, unspecified: Secondary | ICD-10-CM | POA: Insufficient documentation

## 2020-05-07 DIAGNOSIS — L853 Xerosis cutis: Secondary | ICD-10-CM | POA: Diagnosis not present

## 2020-05-07 DIAGNOSIS — L821 Other seborrheic keratosis: Secondary | ICD-10-CM | POA: Diagnosis not present

## 2020-05-07 DIAGNOSIS — Z85828 Personal history of other malignant neoplasm of skin: Secondary | ICD-10-CM | POA: Diagnosis not present

## 2020-05-07 DIAGNOSIS — D1801 Hemangioma of skin and subcutaneous tissue: Secondary | ICD-10-CM | POA: Diagnosis not present

## 2020-05-07 DIAGNOSIS — D692 Other nonthrombocytopenic purpura: Secondary | ICD-10-CM | POA: Diagnosis not present

## 2020-05-20 DIAGNOSIS — M19012 Primary osteoarthritis, left shoulder: Secondary | ICD-10-CM | POA: Diagnosis not present

## 2020-05-21 DIAGNOSIS — M5459 Other low back pain: Secondary | ICD-10-CM | POA: Diagnosis not present

## 2020-05-27 DIAGNOSIS — M5136 Other intervertebral disc degeneration, lumbar region: Secondary | ICD-10-CM | POA: Diagnosis not present

## 2020-06-13 IMAGING — MG DIGITAL SCREENING BILATERAL MAMMOGRAM WITH TOMO AND CAD
6 of 12 series · 6 of 36 positions shown · non-contrast
Comparison: Previous exam(s).

CLINICAL DATA: Screening.

EXAM:
DIGITAL SCREENING BILATERAL MAMMOGRAM WITH TOMO AND CAD

[R MLO synth-2D]
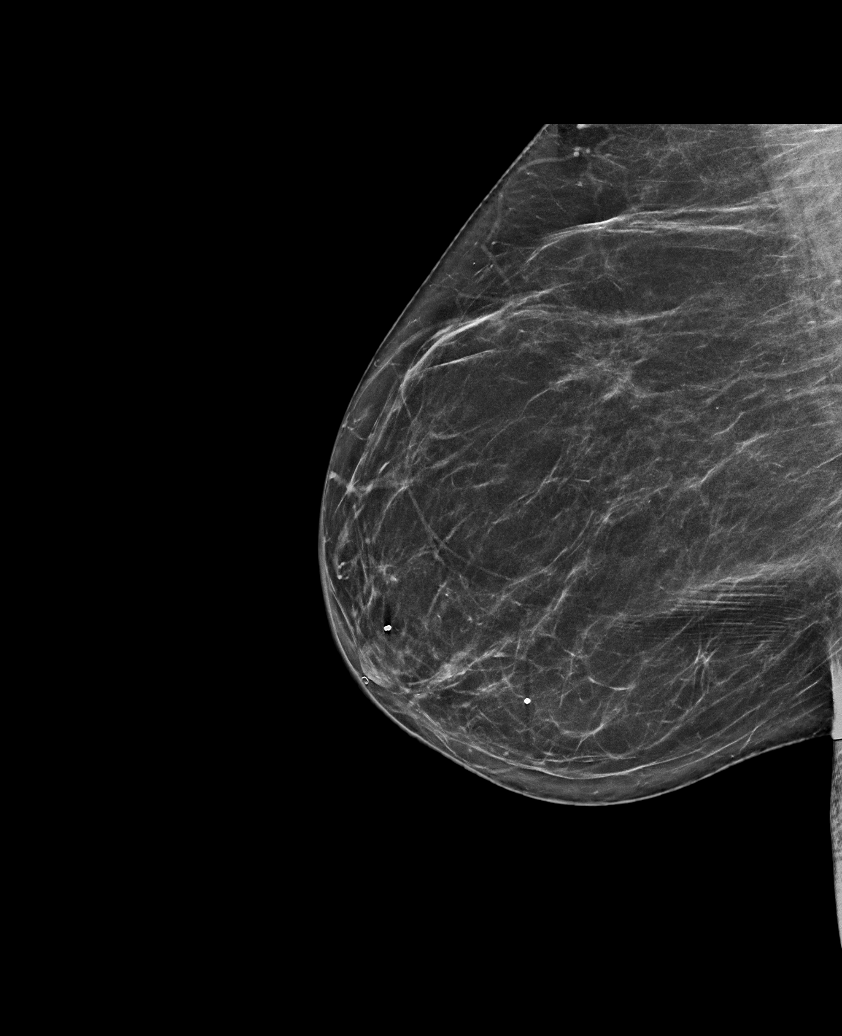

[L CC synth-2D (1 of 2)]
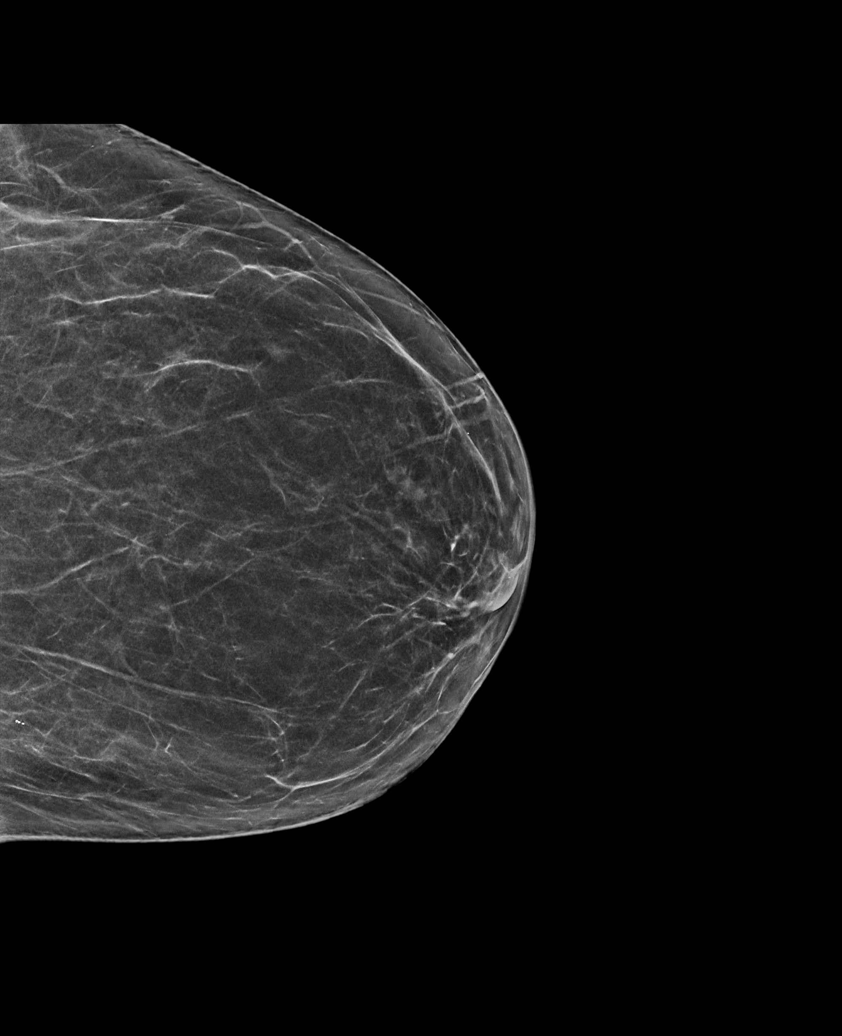

[L CC synth-2D (2 of 2)]
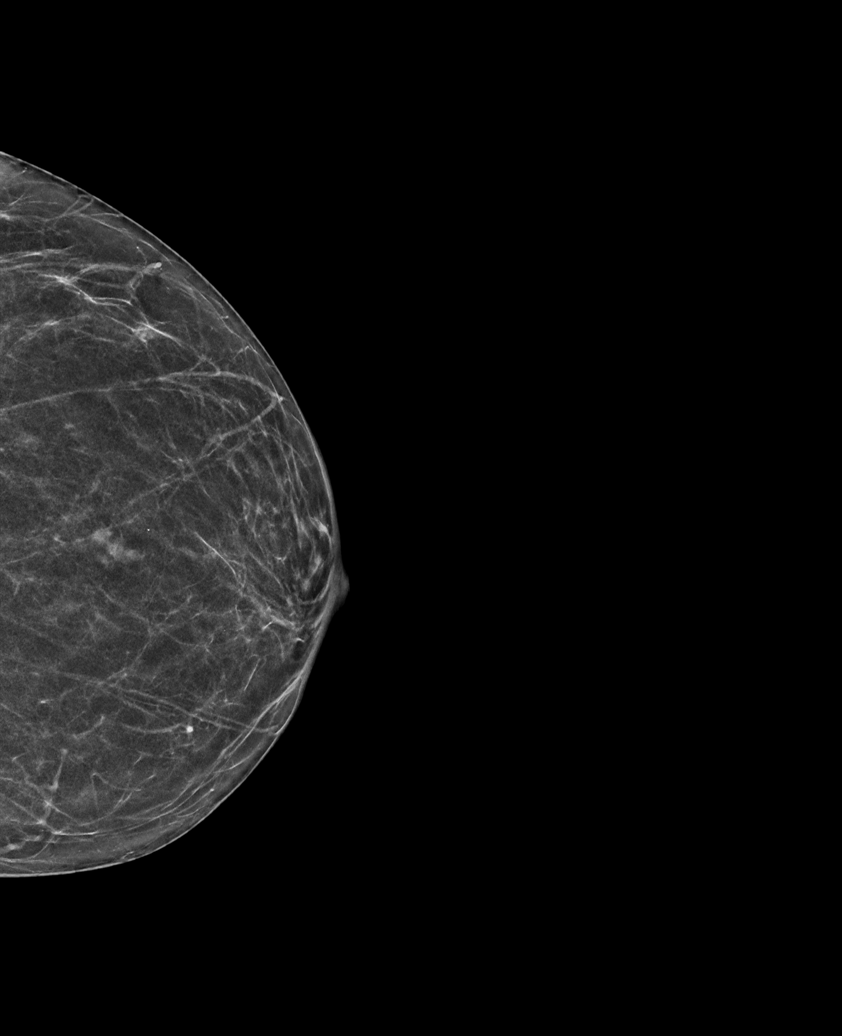

[R CC synth-2D (1 of 2)]
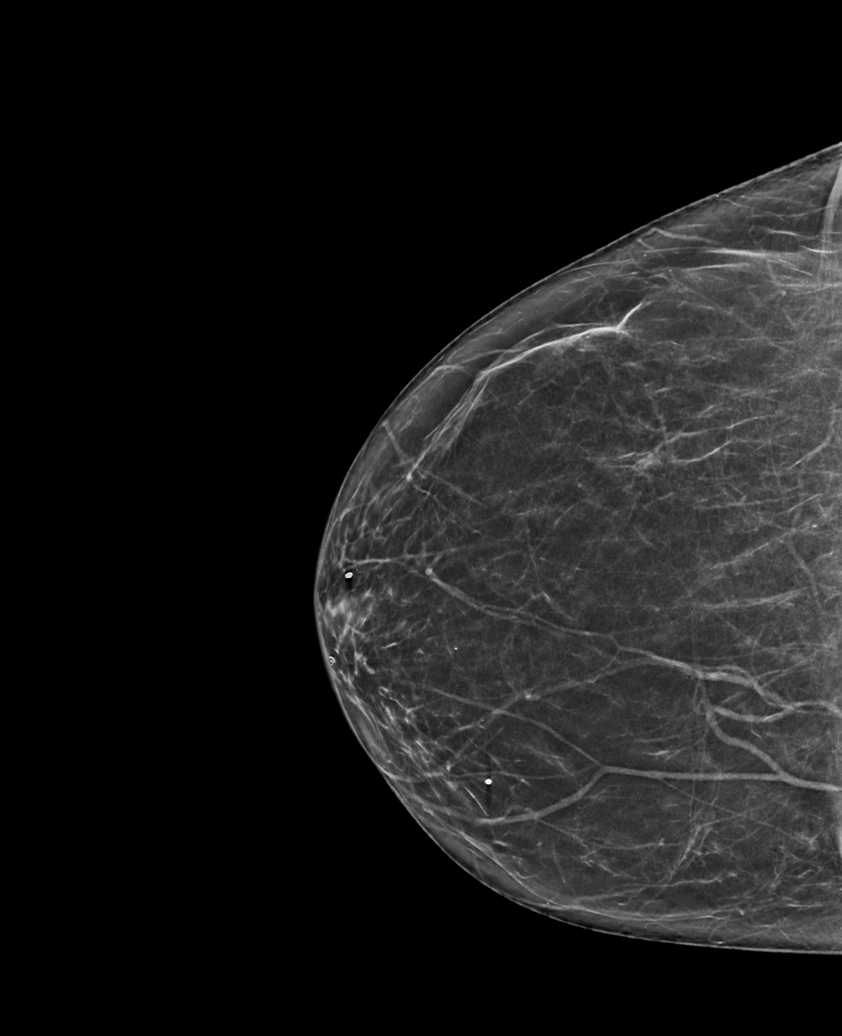

[R CC synth-2D (2 of 2)]
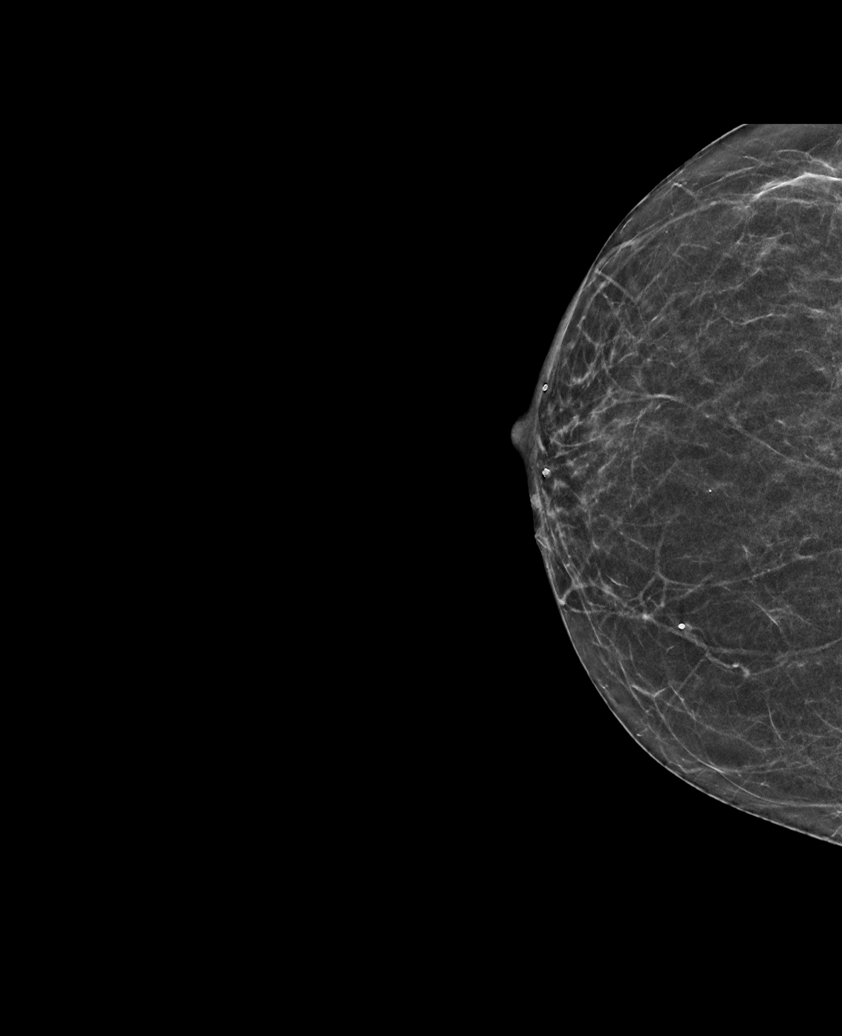

[L MLO synth-2D]
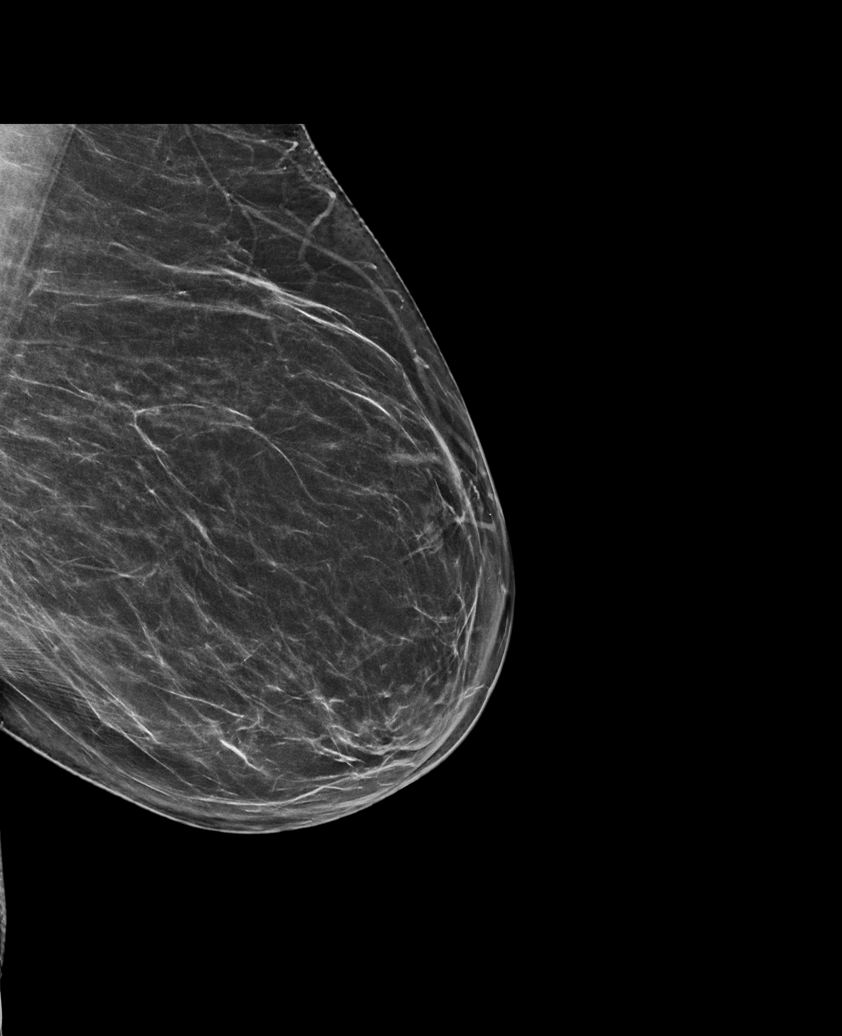

[6 of 36 positions shown; findings below may reference images not displayed]

ACR Breast Density Category b: There are scattered areas of
fibroglandular density.
FINDINGS: There are no findings suspicious for malignancy. Images were
processed with CAD.
IMPRESSION: No mammographic evidence of malignancy. A result letter of this
screening mammogram will be mailed directly to the patient.

RECOMMENDATION:
Screening mammogram in one year. (Code:[id number])

BI-RADS CATEGORY  1: Negative.

## 2020-08-05 DIAGNOSIS — M19012 Primary osteoarthritis, left shoulder: Secondary | ICD-10-CM | POA: Diagnosis not present

## 2020-08-06 DIAGNOSIS — M5416 Radiculopathy, lumbar region: Secondary | ICD-10-CM | POA: Diagnosis not present

## 2020-08-13 DIAGNOSIS — H401131 Primary open-angle glaucoma, bilateral, mild stage: Secondary | ICD-10-CM | POA: Diagnosis not present

## 2020-08-13 DIAGNOSIS — H04123 Dry eye syndrome of bilateral lacrimal glands: Secondary | ICD-10-CM | POA: Diagnosis not present

## 2020-08-13 DIAGNOSIS — H52201 Unspecified astigmatism, right eye: Secondary | ICD-10-CM | POA: Diagnosis not present

## 2020-08-14 DIAGNOSIS — M5416 Radiculopathy, lumbar region: Secondary | ICD-10-CM | POA: Diagnosis not present

## 2020-08-19 DIAGNOSIS — M5416 Radiculopathy, lumbar region: Secondary | ICD-10-CM | POA: Diagnosis not present

## 2020-08-21 DIAGNOSIS — M5416 Radiculopathy, lumbar region: Secondary | ICD-10-CM | POA: Diagnosis not present

## 2020-08-25 DIAGNOSIS — M5416 Radiculopathy, lumbar region: Secondary | ICD-10-CM | POA: Diagnosis not present

## 2020-08-28 DIAGNOSIS — M5416 Radiculopathy, lumbar region: Secondary | ICD-10-CM | POA: Diagnosis not present

## 2020-09-07 DIAGNOSIS — M5416 Radiculopathy, lumbar region: Secondary | ICD-10-CM | POA: Diagnosis not present

## 2020-09-11 DIAGNOSIS — M5416 Radiculopathy, lumbar region: Secondary | ICD-10-CM | POA: Diagnosis not present

## 2020-09-14 DIAGNOSIS — H04123 Dry eye syndrome of bilateral lacrimal glands: Secondary | ICD-10-CM | POA: Diagnosis not present

## 2020-09-14 DIAGNOSIS — H401131 Primary open-angle glaucoma, bilateral, mild stage: Secondary | ICD-10-CM | POA: Diagnosis not present

## 2020-09-24 DIAGNOSIS — M5416 Radiculopathy, lumbar region: Secondary | ICD-10-CM | POA: Diagnosis not present

## 2020-09-28 DIAGNOSIS — M359 Systemic involvement of connective tissue, unspecified: Secondary | ICD-10-CM | POA: Diagnosis not present

## 2020-09-28 DIAGNOSIS — M150 Primary generalized (osteo)arthritis: Secondary | ICD-10-CM | POA: Diagnosis not present

## 2020-09-28 DIAGNOSIS — G5793 Unspecified mononeuropathy of bilateral lower limbs: Secondary | ICD-10-CM | POA: Diagnosis not present

## 2020-09-28 DIAGNOSIS — Z6834 Body mass index (BMI) 34.0-34.9, adult: Secondary | ICD-10-CM | POA: Diagnosis not present

## 2020-09-28 DIAGNOSIS — E669 Obesity, unspecified: Secondary | ICD-10-CM | POA: Diagnosis not present

## 2020-09-28 DIAGNOSIS — R251 Tremor, unspecified: Secondary | ICD-10-CM | POA: Diagnosis not present

## 2020-09-30 DIAGNOSIS — M5416 Radiculopathy, lumbar region: Secondary | ICD-10-CM | POA: Diagnosis not present

## 2020-10-07 DIAGNOSIS — E039 Hypothyroidism, unspecified: Secondary | ICD-10-CM | POA: Diagnosis not present

## 2020-10-07 DIAGNOSIS — E785 Hyperlipidemia, unspecified: Secondary | ICD-10-CM | POA: Diagnosis not present

## 2020-10-07 DIAGNOSIS — E1140 Type 2 diabetes mellitus with diabetic neuropathy, unspecified: Secondary | ICD-10-CM | POA: Diagnosis not present

## 2020-10-08 NOTE — Progress Notes (Signed)
Please place orders in epic pt. Is scheduled for preop 

## 2020-10-09 NOTE — Patient Instructions (Addendum)
DUE TO COVID-19 ONLY ONE VISITOR IS ALLOWED TO COME WITH YOU AND STAY IN THE WAITING ROOM ONLY DURING PRE OP AND PROCEDURE DAY OF SURGERY.   TWO VISITOR  MAY VISIT WITH YOU AFTER SURGERY IN YOUR PRIVATE ROOM DURING VISITING HOURS ONLY!  YOU NEED TO HAVE A COVID 19 TEST ON_8-3-22_____8am-3pm_____, THIS TEST MUST BE DONE BEFORE SURGERY,    COVID TESTING SITE       [place] [facility]    [phone]   ONCE YOUR COVID TEST IS COMPLETED,  PLEASE Wear a mask when in public           Your procedure is scheduled on: 10-23-20   Report to [facility] Main  Entrance   Report to short stay at      [place]  AM     Call this number if you have problems the morning of surgery [phone]    Remember: Do not eat food :After Midnight. You may have clear liquids until 0430 am then nothing by mouth     CLEAR LIQUID DIET   Foods Allowed                                                                                  Foods Excluded Water  Black Coffee and tea, regular and decaf                                    liquids that you cannot  Plain Jell-O any favor except red or purple                                           see through such as: Fruit ices (not with fruit pulp)                                                   milk, soups, orange juice  Iced Popsicles                                                     All solid food Carbonated beverages, regular and diet                                    Cranberry, grape and apple juices Sports drinks like Gatorade Lightly seasoned clear broth or consume(fat free) Sugar, honey syrup   _____________________________________________________________________   BRUSH YOUR TEETH MORNING OF SURGERY AND RINSE YOUR MOUTH OUT, NO CHEWING GUM CANDY OR MINTS.     Take these medicines the morning of surgery with A SIP OF WATER:  omeprazole,levothyroxine,gabapentin, eye drops as usual  DO NOT TAKE ANY DIABETIC  MEDICATIONS DAY OF YOUR SURGERY                               You may not have any metal on your body including hair pins and              piercings  Do not wear jewelry, make-up, lotions, powders or perfumes, deodorant             Do not wear nail polish on your fingernails or toenails .  Do not shave  48 hours prior to surgery.              Do not bring valuables to the hospital. [place].  Contacts, dentures or bridgework may not be worn into surgery.      Patients discharged the day of surgery will not be allowed to drive home. IF YOU ARE HAVING SURGERY AND GOING HOME THE SAME DAY, YOU MUST HAVE AN ADULT TO DRIVE YOU HOME AND BE WITH YOU FOR 24 HOURS. YOU MAY GO HOME BY TAXI OR UBER OR ORTHERWISE, BUT AN ADULT MUST ACCOMPANY YOU HOME AND STAY WITH YOU FOR 24 HOURS.  Name and phone number of your driver:  Special Instructions: N/A              Please read over the following fact sheets you were given: _____________________________________________________________________             [facility] - Preparing for Surgery Before surgery, you can play an important role.  Because skin is not sterile, your skin needs to be as free of germs as possible.  You can reduce the number of germs on your skin by washing with CHG (chlorahexidine gluconate) soap before surgery.  CHG is an antiseptic cleaner which kills germs and bonds with the skin to continue killing germs even after washing. Please DO NOT use if you have an allergy to CHG or antibacterial soaps.  If your skin becomes reddened/irritated stop using the CHG and inform your nurse when you arrive at Short Stay. Do not shave (including legs and underarms) for at least 48 hours prior to the first CHG shower.  You may shave your face/neck. Please follow these instructions carefully:  1.  Shower with CHG Soap the night before surgery and the  morning of Surgery.  2.  If you choose to wash your  hair, wash your hair first as usual with your  normal  shampoo.  3.  After you shampoo, rinse your hair and body thoroughly to remove the  shampoo.                           4.  Use CHG as you would any other liquid soap.  You can apply chg directly  to the skin and wash                       Gently with a scrungie or clean washcloth.  5.  Apply the CHG Soap to your body ONLY FROM THE NECK DOWN.   Do not use on face/ open  Wound or open sores. Avoid contact with eyes, ears mouth and genitals (private parts).                       Wash face,  Genitals (private parts) with your normal soap.             6.  Wash thoroughly, paying special attention to the area where your surgery  will be performed.  7.  Thoroughly rinse your body with warm water from the neck down.  8.  DO NOT shower/wash with your normal soap after using and rinsing off  the CHG Soap.                9.  Pat yourself dry with a clean towel.            10.  Wear clean pajamas.            11.  Place clean sheets on your bed the night of your first shower and do not  sleep with pets. Day of Surgery : Do not apply any lotions/deodorants the morning of surgery.  Please wear clean clothes to the hospital/surgery center.  FAILURE TO FOLLOW THESE INSTRUCTIONS MAY RESULT IN THE CANCELLATION OF YOUR SURGERY PATIENT SIGNATURE_________________________________  NURSE SIGNATURE__________________________________  ___  Incentive Spirometer  An incentive spirometer is a tool that can help keep your lungs clear and active. This tool measures how well you are filling your lungs with each breath. Taking long deep breaths may help reverse or decrease the chance of developing breathing (pulmonary) problems (especially infection) following: A long period of time when you are unable to move or be active. BEFORE THE PROCEDURE  If the spirometer includes an indicator to show your best effort, your nurse or respiratory  therapist will set it to a desired goal. If possible, sit up straight or lean slightly forward. Try not to slouch. Hold the incentive spirometer in an upright position. INSTRUCTIONS FOR USE  Sit on the edge of your bed if possible, or sit up as far as you can in bed or on a chair. Hold the incentive spirometer in an upright position. Breathe out normally. Place the mouthpiece in your mouth and seal your lips tightly around it. Breathe in slowly and as deeply as possible, raising the piston or the ball toward the top of the column. Hold your breath for 3-5 seconds or for as long as possible. Allow the piston or ball to fall to the bottom of the column. Remove the mouthpiece from your mouth and breathe out normally. Rest for a few seconds and repeat Steps 1 through 7 at least 10 times every 1-2 hours when you are awake. Take your time and take a few normal breaths between deep breaths. The spirometer may include an indicator to show your best effort. Use the indicator as a goal to work toward during each repetition. After each set of 10 deep breaths, practice coughing to be sure your lungs are clear. If you have an incision (the cut made at the time of surgery), support your incision when coughing by placing a pillow or rolled up towels firmly against it. Once you are able to get out of bed, walk around indoors and cough well. You may stop using the incentive spirometer when instructed by your caregiver.  RISKS AND COMPLICATIONS Take your time so you do not get dizzy or light-headed. If you are in pain, you may need to take or ask for  pain medication before doing incentive spirometry. It is harder to take a deep breath if you are having pain. AFTER USE Rest and breathe slowly and easily. It can be helpful to keep track of a log of your progress. Your caregiver can provide you with a simple table to help with this. If you are using the spirometer at home, follow these instructions: [place] IF:  You are having difficultly using the spirometer. You have trouble using the spirometer as often as instructed. Your pain medication is not giving enough relief while using the spirometer. You develop fever of 100.5 F (38.1 C) or higher. SEEK IMMEDIATE MEDICAL CARE IF:  You cough up bloody sputum that had not been present before. You develop fever of 102 F (38.9 C) or greater. You develop worsening pain at or near the incision site. MAKE SURE YOU:  Understand these instructions. Will watch your condition. Will get help right away if you are not doing well or get worse. Document Released: 07/18/2006 Document Revised: 05/30/2011 Document Reviewed: 09/18/2006 [facility] Patient Information 2014 [employer], [place].   ________________________________________________________________________ _____________________________________________________________________

## 2020-10-09 NOTE — Progress Notes (Signed)
Sent message, via epic in basket, requesting orders in epic from surgeon.  

## 2020-10-09 NOTE — Progress Notes (Addendum)
PCP - Dr. Prince Solian Cardiologist - no  PPM/ICD -  Device Orders -  Rep Notified -   Chest x-ray -  EKG - 10-12-20 Stress Test -  ECHO -  Cardiac Cath -   Sleep Study -  CPAP -   Fasting Blood Sugar -  Checks Blood Sugar _____ times a day  Blood Thinner Instructions: Aspirin Instructions:  ERAS Protcol - PRE-SURGERY   COVID TEST- 8-3  Activity--Able to walk a flight of stairs without SOB . Able to complete ADL's without SOB Anesthesia review: HTN  Patient denies shortness of breath, fever, cough and chest pain at PAT appointment   All instructions explained to the patient, with a verbal understanding of the material. Patient agrees to go over the instructions while at home for a better understanding. Patient also instructed to self quarantine after being tested for COVID-19. The opportunity to ask questions was provided.

## 2020-10-12 ENCOUNTER — Other Ambulatory Visit: Payer: Self-pay

## 2020-10-12 ENCOUNTER — Encounter (HOSPITAL_COMMUNITY): Payer: Self-pay

## 2020-10-12 ENCOUNTER — Encounter (HOSPITAL_COMMUNITY)
Admission: RE | Admit: 2020-10-12 | Discharge: 2020-10-12 | Disposition: A | Discharge status: Home / Self Care | Payer: Medicare HMO | Source: Ambulatory Visit | Attending: Orthopedic Surgery | Admitting: Orthopedic Surgery

## 2020-10-12 DIAGNOSIS — M5416 Radiculopathy, lumbar region: Secondary | ICD-10-CM | POA: Diagnosis not present

## 2020-10-12 DIAGNOSIS — Z01818 Encounter for other preprocedural examination: Secondary | ICD-10-CM | POA: Insufficient documentation

## 2020-10-12 HISTORY — DX: Mumps without complication: B26.9

## 2020-10-12 HISTORY — DX: Rosacea, unspecified: L71.9

## 2020-10-12 HISTORY — DX: Family history of other specified conditions: Z84.89

## 2020-10-12 HISTORY — DX: Sjogren syndrome, unspecified: M35.00

## 2020-10-12 HISTORY — DX: Essential (primary) hypertension: I10

## 2020-10-12 HISTORY — DX: Other specified postprocedural states: Z98.890

## 2020-10-12 HISTORY — DX: Scoliosis, unspecified: M41.9

## 2020-10-12 HISTORY — DX: Prediabetes: R73.03

## 2020-10-12 HISTORY — DX: Unspecified glaucoma: H40.9

## 2020-10-12 HISTORY — DX: Unspecified osteoarthritis, unspecified site: M19.90

## 2020-10-12 HISTORY — DX: Unspecified malaria: B54

## 2020-10-12 HISTORY — DX: Other specified postprocedural states: R11.2

## 2020-10-12 HISTORY — DX: Hypothyroidism, unspecified: E03.9

## 2020-10-12 HISTORY — DX: Anemia, unspecified: D64.9

## 2020-10-12 HISTORY — DX: Dry eye syndrome of unspecified lacrimal gland: H04.129

## 2020-10-12 HISTORY — DX: Other complications of anesthesia, initial encounter: T88.59XA

## 2020-10-12 LAB — CBC
HCT: 44.6 % (ref 36.0–46.0)
Hemoglobin: 15.1 g/dL — ABNORMAL HIGH (ref 12.0–15.0)
MCH: 32.8 pg (ref 26.0–34.0)
MCHC: 33.9 g/dL (ref 30.0–36.0)
MCV: 96.7 fL (ref 80.0–100.0)
Platelets: 152 10*3/uL (ref 150–400)
RBC: 4.61 MIL/uL (ref 3.87–5.11)
RDW: 12.3 % (ref 11.5–15.5)
WBC: 4.5 10*3/uL (ref 4.0–10.5)
nRBC: 0 % (ref 0.0–0.2)

## 2020-10-12 LAB — COMPREHENSIVE METABOLIC PANEL
ALT: 15 U/L (ref 0–44)
AST: 23 U/L (ref 15–41)
Albumin: 4.1 g/dL (ref 3.5–5.0)
Alkaline Phosphatase: 50 U/L (ref 38–126)
Anion gap: 8 (ref 5–15)
BUN: 18 mg/dL (ref 8–23)
CO2: 29 mmol/L (ref 22–32)
Calcium: 9.6 mg/dL (ref 8.9–10.3)
Chloride: 101 mmol/L (ref 98–111)
Creatinine, Ser: 0.87 mg/dL (ref 0.44–1.00)
GFR, Estimated: >60 mL/min (ref >60)
Glucose, Bld: 110 mg/dL — ABNORMAL HIGH (ref 70–99)
Potassium: 3.3 mmol/L — ABNORMAL LOW (ref 3.5–5.1)
Sodium: 138 mmol/L (ref 135–145)
Total Bilirubin: 0.9 mg/dL (ref 0.3–1.2)
Total Protein: 7.9 g/dL (ref 6.5–8.1)

## 2020-10-12 LAB — SURGICAL PCR SCREEN
MRSA, PCR: NEGATIVE
Staphylococcus aureus: POSITIVE — AB

## 2020-10-13 LAB — HEMOGLOBIN A1C
Hgb A1c MFr Bld: 6.4 % — ABNORMAL HIGH (ref 4.8–5.6)
Mean Plasma Glucose: 137 mg/dL

## 2020-10-13 NOTE — Progress Notes (Signed)
PCR results sent to Dr. Norris to review. 

## 2020-10-13 NOTE — H&P (Signed)
Patient's anticipated LOS is less than 2 midnights, meeting these requirements: - Younger than 48 - Lives within 1 hour of care - Has a competent adult at home to recover with post-op recover - NO history of  - Chronic pain requiring opiods  - Diabetes  - Coronary Artery Disease  - Heart failure  - Heart attack  - Stroke  - DVT/VTE  - Cardiac arrhythmia  - Respiratory Failure/COPD  - Renal failure  - Anemia  - Advanced Liver disease     Tracy Hunt is an 75 y.o. female.    Chief Complaint: left shoulder pain  HPI: Pt is a 75 y.o. female complaining of left shoulder pain for multiple years. Pain had continually increased since the beginning. X-rays in the clinic show end-stage arthritic changes of the left shoulder. Pt has tried various conservative treatments which have failed to alleviate their symptoms, including injections and therapy. Various options are discussed with the patient. Risks, benefits and expectations were discussed with the patient. Patient understand the risks, benefits and expectations and wishes to proceed with surgery.   PCP:  Prince Solian, MD  D/C Plans: Home  PMH: Past Medical History:  Diagnosis Date   Anemia    Arthritis    Bell's palsy [id number]   Complication of anesthesia    Hard to wake up after a  colonoscopy before  around 2000   Dry eye    Family history of adverse reaction to anesthesia    mom hard to wake up and had lethargy after anesthesia   Glaucoma    Hepatitis 1986   secondary to varicella was in hospital for 2 days   Hypertension    Hypothyroidism    Malaria    as as child   Mumps    PONV (postoperative nausea and vomiting)    Pre-diabetes    Rosacea    Scoliosis    Sjogren's syndrome (HCC)     PSH: Past Surgical History:  Procedure Laterality Date   [employer] Bilateral    cataract   JOINT REPLACEMENT  2012   Left knee   JOINT  REPLACEMENT     right knee 2017   steroid injections     L3,L4, L5    Social History:  reports that she has never smoked. She has never used smokeless tobacco. She reports previous alcohol use. She reports that she does not use drugs.  Allergies:  Allergies  Allergen Reactions   Other Itching    Adhesive tape    Shellfish Allergy Nausea And Vomiting    Oysters    Medications: No current facility-administered medications for this encounter.   Current Outpatient Medications  Medication Sig Dispense Refill   acetaminophen (TYLENOL) 650 MG CR tablet Take 1,300 mg by mouth in the morning, at noon, and at bedtime.     bismuth subsalicylate (PEPTO BISMOL) 262 MG chewable tablet Chew 524 mg by mouth as needed for indigestion or diarrhea or loose stools.     Carboxymethylcellulose Sodium (REFRESH LIQUIGEL) 1 % GEL Place 1 drop into both eyes at bedtime.     Cholecalciferol (VITAMIN D) 50 MCG (2000 UT) tablet Take 2,000 Units by mouth daily.     dorzolamide-timolol (COSOPT) 22.3-6.8 MG/ML ophthalmic solution Place 1 drop into both eyes in the morning and at bedtime.     estradiol (ESTRACE) 1 MG tablet Take 1 mg by  mouth daily.     gabapentin (NEURONTIN) 300 MG capsule Take 300 mg by mouth 3 (three) times daily.     hydroxypropyl methylcellulose / hypromellose (ISOPTO TEARS / GONIOVISC) 2.5 % ophthalmic solution Place 1 drop into both eyes daily.     levothyroxine (SYNTHROID) 125 MCG tablet Take 125 mcg by mouth daily.     Menthol, Topical Analgesic, (BIOFREEZE EX) Apply 1 drop topically daily as needed (back pain).     Multiple Vitamins-Minerals (CENTRUM SILVER 50+WOMEN PO) Take 1 tablet by mouth daily.     omeprazole (PRILOSEC) 20 MG capsule Take 20 mg by mouth daily.     rosuvastatin (CRESTOR) 10 MG tablet Take 10 mg by mouth at bedtime.     simethicone (MYLICON) 0000000 MG chewable tablet Chew 125 mg by mouth every 6 (six) hours as needed for flatulence.     traMADol (ULTRAM) 50 MG tablet  Take 50 mg by mouth every evening.     triamterene-hydrochlorothiazide (MAXZIDE-25) 37.5-25 MG tablet Take 1 tablet by mouth daily.      Results for orders placed or performed during the hospital encounter of 10/12/20 (from the past 48 hour(s))  Surgical pcr screen     Status: Abnormal   Collection Time: 10/12/20  3:22 PM   Specimen: Nasal Mucosa; Nasal Swab  Result Value Ref Range   MRSA, PCR NEGATIVE NEGATIVE   Staphylococcus aureus POSITIVE (A) NEGATIVE    Comment: (NOTE) The Xpert SA Assay (FDA approved for NASAL specimens in patients 45 years of age and older), is one component of a comprehensive surveillance program. It is not intended to diagnose infection nor to guide or monitor treatment. Performed at [facility], [place] [address] [place], [place] [address]   CBC per protocol     Status: Abnormal   Collection Time: 10/12/20  3:22 PM  Result Value Ref Range   WBC 4.5 4.0 - 10.5 K/uL   RBC 4.61 3.87 - 5.11 MIL/uL   Hemoglobin 15.1 (H) 12.0 - 15.0 g/dL   HCT 44.6 36.0 - 46.0 %   MCV 96.7 80.0 - 100.0 fL   MCH 32.8 26.0 - 34.0 pg   MCHC 33.9 30.0 - 36.0 g/dL   RDW 12.3 11.5 - 15.5 %   Platelets 152 150 - 400 K/uL   nRBC 0.0 0.0 - 0.2 %    Comment: Performed at [facility], [place] [address] [place], [place] [address]  Comprehensive metabolic panel     Status: Abnormal   Collection Time: 10/12/20  3:22 PM  Result Value Ref Range   Sodium 138 135 - 145 mmol/L   Potassium 3.3 (L) 3.5 - 5.1 mmol/L   Chloride 101 98 - 111 mmol/L   CO2 29 22 - 32 mmol/L   Glucose, Bld 110 (H) 70 - 99 mg/dL    Comment: Glucose reference range applies only to samples taken after fasting for at least 8 hours.   BUN 18 8 - 23 mg/dL   Creatinine, Ser 0.87 0.44 - 1.00 mg/dL   Calcium 9.6 8.9 - 10.3 mg/dL   Total Protein 7.9 6.5 - 8.1 g/dL   Albumin 4.1 3.5 - 5.0 g/dL   AST 23 15 - 41 U/L   ALT 15 0 - 44 U/L   Alkaline Phosphatase 50 38 - 126 U/L   Total  Bilirubin 0.9 0.3 - 1.2 mg/dL   GFR, Estimated >60 >60 mL/min    Comment: (NOTE) Calculated using the CKD-EPI Creatinine  Equation (2021)    Anion gap 8 5 - 15    Comment: Performed at [facility], [place] [address] [place], [place] [address]  Hemoglobin A1c     Status: Abnormal   Collection Time: 10/12/20  3:22 PM  Result Value Ref Range   Hgb A1c MFr Bld 6.4 (H) 4.8 - 5.6 %    Comment: (NOTE)         Prediabetes: 5.7 - 6.4         Diabetes: >6.4         Glycemic control for adults with diabetes: <7.0    Mean Plasma Glucose 137 mg/dL    Comment: (NOTE) Performed At: [facility] [place], [place] [id number] Rush Farmer MD [id number]    No results found.  ROS: Pain with rom of the left upper extremity  Physical Exam: Alert and oriented 75 y.o. female in no acute distress Cranial nerves 2-12 intact Cervical spine: full rom with no tenderness, nv intact distally Chest: active breath sounds bilaterally, no wheeze rhonchi or rales Heart: regular rate and rhythm, no murmur Abd: non tender non distended with active bowel sounds Hip is stable with rom  Left shoulder with painful rom No rashes or edema  Assessment/Plan Assessment: left shoulder cuff arthropathy  Plan:  Patient will undergo a left reverse total shoulder by Dr. Veverly Fells at [place] Risks benefits and expectations were discussed with the patient. Patient understand risks, benefits and expectations and wishes to proceed. Preoperative templating of the joint replacement has been completed, documented, and submitted to the Operating Room personnel in order to optimize intra-operative equipment management.   Merla Riches PA-C, MPAS [facility] is now [facility] [address] [place], [place], [place] [address] Phone: [phone] [email or web address]  [employer]

## 2020-10-14 DIAGNOSIS — Z1212 Encounter for screening for malignant neoplasm of rectum: Secondary | ICD-10-CM | POA: Diagnosis not present

## 2020-10-14 DIAGNOSIS — Z0000 Encounter for general adult medical examination without abnormal findings: Secondary | ICD-10-CM | POA: Diagnosis not present

## 2020-10-14 DIAGNOSIS — E785 Hyperlipidemia, unspecified: Secondary | ICD-10-CM | POA: Diagnosis not present

## 2020-10-14 DIAGNOSIS — K219 Gastro-esophageal reflux disease without esophagitis: Secondary | ICD-10-CM | POA: Diagnosis not present

## 2020-10-14 DIAGNOSIS — R109 Unspecified abdominal pain: Secondary | ICD-10-CM | POA: Diagnosis not present

## 2020-10-14 DIAGNOSIS — R82998 Other abnormal findings in urine: Secondary | ICD-10-CM | POA: Diagnosis not present

## 2020-10-14 DIAGNOSIS — R197 Diarrhea, unspecified: Secondary | ICD-10-CM | POA: Diagnosis not present

## 2020-10-14 DIAGNOSIS — M1990 Unspecified osteoarthritis, unspecified site: Secondary | ICD-10-CM | POA: Diagnosis not present

## 2020-10-14 DIAGNOSIS — R209 Unspecified disturbances of skin sensation: Secondary | ICD-10-CM | POA: Diagnosis not present

## 2020-10-14 DIAGNOSIS — R140 Abdominal distension (gaseous): Secondary | ICD-10-CM | POA: Diagnosis not present

## 2020-10-14 DIAGNOSIS — M3505 Sjogren syndrome with inflammatory arthritis: Secondary | ICD-10-CM | POA: Diagnosis not present

## 2020-10-14 DIAGNOSIS — E039 Hypothyroidism, unspecified: Secondary | ICD-10-CM | POA: Diagnosis not present

## 2020-10-14 DIAGNOSIS — E1140 Type 2 diabetes mellitus with diabetic neuropathy, unspecified: Secondary | ICD-10-CM | POA: Diagnosis not present

## 2020-10-14 DIAGNOSIS — K5900 Constipation, unspecified: Secondary | ICD-10-CM | POA: Diagnosis not present

## 2020-10-14 NOTE — Progress Notes (Signed)
[facility] New Patient Evaluation and Consultation  Referring Provider: Carlyon Shadow, MD PCP: Prince Solian, MD Date of Service: 10/16/2020  SUBJECTIVE Chief Complaint: New Patient (Initial Visit)- prolapse  History of Present Illness: Tracy Hunt is a 75 y.o.  hispanic  female seen in consultation at the request of Dr. Mardelle Matte for evaluation of prolapse.    Review of records from Dr Mardelle Matte significant for: Feels a ball stuck in vagina and feels she is not emptying bowels. Has remote history of TAH for prolapse.   Urinary Symptoms: Leaks urine with going from sitting to standing if she waits too long to go to the bathroom Leaks 0-1 time(s) per day.  none She is not bothered by her UI symptoms.  Day time voids 4-5.  Nocturia: 1-2 times per night to void. Voiding dysfunction: she empties her bladder well.  does not use a catheter to empty bladder.  When urinating, she feels a weak stream and difficulty starting urine stream   UTIs:  0  UTI's in the last year.   Denies history of blood in urine and kidney or bladder stones  Pelvic Organ Prolapse Symptoms:                  She Admits to a feeling of a bulge the vaginal area. It has been present for years but worsening in the last  6 months.  She Denies seeing a bulge.  This bulge is sometimes bothersome.  Bowel Symptom: Bowel movements: every 3-4 days Stool consistency: hard Straining: yes.  Splinting: no.  Incomplete evacuation: yes.  She Denies accidental bowel leakage / fecal incontinence Bowel regimen: diet Last colonoscopy: Date 05/2019, Results negative  Sexual Function Sexually active: no.    Pelvic Pain Denies pelvic pain    Past Medical History:  Past Medical History:  Diagnosis Date   Anemia    Arthritis    Bell's palsy [id number]   Complication of anesthesia    Hard to wake up after a  colonoscopy before  around 2000   Dry eye    Family history of adverse reaction to  anesthesia    mom hard to wake up and had lethargy after anesthesia   Glaucoma    Hepatitis 1986   secondary to varicella was in hospital for 2 days   Hypertension    Hypothyroidism    Malaria    as as child   Mumps    PONV (postoperative nausea and vomiting)    Pre-diabetes    Rosacea    Scoliosis    Sjogren's syndrome ([place])      Past Surgical History:   Past Surgical History:  Procedure Laterality Date   [place] Bilateral    cataract   JOINT REPLACEMENT  2012   Left knee   JOINT REPLACEMENT     right knee 2017   steroid injections     L3,L4, L5     Past OB/GYN History: G2 P2 Vaginal deliveries: 2,  Forceps/ Vacuum deliveries: 0, Cesarean section: 0 Menopausal: Yes, Denies vaginal bleeding since menopause S/p hysterectomy for prolapse   Medications: She has a current medication list which includes the following prescription(s): acetaminophen, bismuth subsalicylate, carboxymethylcellulose sodium, vitamin d, dorzolamide-timolol, estradiol, gabapentin, hydroxypropyl methylcellulose / hypromellose, levothyroxine, menthol (topical analgesic), multiple vitamins-minerals, omeprazole, rosuvastatin, simethicone, tramadol, and triamterene-hydrochlorothiazide.   Allergies: Patient is allergic to other, oyster shell,  and shellfish allergy.   Social History:  Social History   Tobacco Use   Smoking status: Never   Smokeless tobacco: Never  Vaping Use   Vaping Use: Never used  Substance Use Topics   Alcohol use: Not Currently   Drug use: Never    Family History:   Family History  Problem Relation Age of Onset   Breast cancer Paternal Grandmother      Review of Systems: Review of Systems  Constitutional:  Negative for fever, malaise/fatigue and weight loss.  Respiratory:  Negative for cough, shortness of breath and wheezing.   Cardiovascular:  Negative for chest pain, palpitations and leg  swelling.  Gastrointestinal:  Positive for abdominal pain. Negative for blood in stool.  Genitourinary:  Negative for dysuria.  Musculoskeletal:  Negative for myalgias.  Skin:  Negative for rash.  Neurological:  Negative for dizziness and headaches.  Endo/Heme/Allergies:  Bruises/bleeds easily.  Psychiatric/Behavioral:  Negative for depression. The patient is not nervous/anxious.     OBJECTIVE Physical Exam: Vitals:   10/16/20 1046  BP: 100/70  Pulse: 68  Weight: 191 lb (86.6 kg)    Physical Exam Constitutional:      General: She is not in acute distress. Pulmonary:     Effort: Pulmonary effort is normal.  Abdominal:     General: There is no distension.     Palpations: Abdomen is soft.     Tenderness: There is no abdominal tenderness. There is no rebound.  Musculoskeletal:        General: No swelling. Normal range of motion.  Skin:    General: Skin is warm and dry.     Findings: No rash.  Neurological:     Mental Status: She is alert and oriented to person, place, and time.  Psychiatric:        Mood and Affect: Mood normal.        Behavior: Behavior normal.     GU / Detailed Urogynecologic Evaluation:  Pelvic Exam: Normal external female genitalia; Bartholin's and Skene's glands normal in appearance; urethral meatus normal in appearance, no urethral masses or discharge.   CST: negative  s/p hysterectomy: Speculum exam reveals normal vaginal mucosa with  atrophy and normal vaginal cuff.  Adnexa  absent .     Pelvic floor strength II/V, puborectalis IV/V external anal sphincter IV/V  Pelvic floor musculature: Right levator non-tender, Right obturator non-tender, Left levator non-tender, Left obturator non-tender  POP-Q:   POP-Q  0                                            Aa   0                                           Ba  -2.5                                              C   4.5  Gh  4.5                                             Pb  7                                            tvl   0.5                                            Ap  0.5                                            Bp                                                 D     Rectal Exam:  Normal sphincter tone, moderate distal rectocele, enterocoele present, no rectal masses, hard stool present in rectum  Post-Void Residual (PVR) by Bladder Scan: In order to evaluate bladder emptying, we discussed obtaining a postvoid residual and she agreed to this procedure. Was unable to void.   Procedure: The ultrasound unit was placed on the patient's abdomen in the suprapubic region after the patient had voided. A PVR of 23 ml was obtained by bladder scan.  Laboratory Results: Unable to provide urine sample  ASSESSMENT AND PLAN Tracy Hunt is a 75 y.o. with:  1. Vaginal vault prolapse after hysterectomy   2. Prolapse of anterior vaginal wall   3. Prolapse of posterior vaginal wall   4. Constipation, unspecified constipation type    Stage II anterior, Stage II posterior, Stage I apical prolapse  For treatment of pelvic organ prolapse, we discussed options for management including expectant management, conservative management, and surgical management, such as Kegels, a pessary, pelvic floor physical therapy, and specific surgical procedures. - She is possibly interested in a pessary but is planning on having surgery on her shoulder soon and would like expectant management at this time.   2. Constipation For constipation, we reviewed the importance of a better bowel regimen.  We also discussed the importance of avoiding chronic straining, as it can exacerbate her pelvic floor symptoms. We discussed initiating therapy with increasing fluid intake, fiber supplementation, stool softeners, and laxatives such as miralax.  - Also provided recipe for "power pudding" which she will try. Advised goal of stools every other day.   Return as  needed if symptoms worsen  Jaquita Folds, MD   Medical Decision Making:  - Review and summation of prior records

## 2020-10-16 ENCOUNTER — Encounter: Payer: Self-pay | Admitting: Obstetrics and Gynecology

## 2020-10-16 ENCOUNTER — Ambulatory Visit (INDEPENDENT_AMBULATORY_CARE_PROVIDER_SITE_OTHER): Payer: Medicare HMO | Admitting: Obstetrics and Gynecology

## 2020-10-16 ENCOUNTER — Other Ambulatory Visit: Payer: Self-pay

## 2020-10-16 VITALS — BP 100/70 | HR 68 | Wt 191.0 lb

## 2020-10-16 DIAGNOSIS — N993 Prolapse of vaginal vault after hysterectomy: Secondary | ICD-10-CM | POA: Diagnosis not present

## 2020-10-16 DIAGNOSIS — K5900 Constipation, unspecified: Secondary | ICD-10-CM | POA: Diagnosis not present

## 2020-10-16 DIAGNOSIS — N816 Rectocele: Secondary | ICD-10-CM

## 2020-10-16 DIAGNOSIS — N8110 Cystocele, unspecified: Secondary | ICD-10-CM

## 2020-10-16 NOTE — Patient Instructions (Addendum)

## 2020-10-21 ENCOUNTER — Other Ambulatory Visit: Payer: Self-pay | Admitting: Orthopedic Surgery

## 2020-10-22 LAB — SARS CORONAVIRUS 2 (TAT 6-24 HRS): SARS Coronavirus 2: NEGATIVE

## 2020-10-23 ENCOUNTER — Other Ambulatory Visit: Payer: Self-pay

## 2020-10-23 ENCOUNTER — Ambulatory Visit (HOSPITAL_COMMUNITY): Payer: Medicare HMO | Admitting: Anesthesiology

## 2020-10-23 ENCOUNTER — Observation Stay (HOSPITAL_COMMUNITY): Payer: Medicare HMO

## 2020-10-23 ENCOUNTER — Observation Stay (HOSPITAL_COMMUNITY)
Admission: RE | Admit: 2020-10-23 | Discharge: 2020-10-24 | Disposition: A | Discharge status: Home / Self Care | Payer: Medicare HMO | Source: Ambulatory Visit | Attending: Orthopedic Surgery | Admitting: Orthopedic Surgery

## 2020-10-23 ENCOUNTER — Encounter (HOSPITAL_COMMUNITY): Payer: Self-pay | Admitting: Orthopedic Surgery

## 2020-10-23 ENCOUNTER — Encounter (HOSPITAL_COMMUNITY)
Admission: RE | Disposition: A | Discharge status: Home / Self Care | Payer: Self-pay | Source: Ambulatory Visit | Attending: Orthopedic Surgery

## 2020-10-23 DIAGNOSIS — M19012 Primary osteoarthritis, left shoulder: Secondary | ICD-10-CM | POA: Diagnosis not present

## 2020-10-23 DIAGNOSIS — I10 Essential (primary) hypertension: Secondary | ICD-10-CM | POA: Diagnosis not present

## 2020-10-23 DIAGNOSIS — E039 Hypothyroidism, unspecified: Secondary | ICD-10-CM | POA: Diagnosis not present

## 2020-10-23 DIAGNOSIS — Z96612 Presence of left artificial shoulder joint: Secondary | ICD-10-CM | POA: Diagnosis not present

## 2020-10-23 DIAGNOSIS — M75102 Unspecified rotator cuff tear or rupture of left shoulder, not specified as traumatic: Secondary | ICD-10-CM | POA: Insufficient documentation

## 2020-10-23 DIAGNOSIS — Z471 Aftercare following joint replacement surgery: Secondary | ICD-10-CM | POA: Diagnosis not present

## 2020-10-23 DIAGNOSIS — R7303 Prediabetes: Secondary | ICD-10-CM | POA: Diagnosis not present

## 2020-10-23 DIAGNOSIS — Z9889 Other specified postprocedural states: Secondary | ICD-10-CM | POA: Diagnosis not present

## 2020-10-23 DIAGNOSIS — G8918 Other acute postprocedural pain: Secondary | ICD-10-CM | POA: Diagnosis not present

## 2020-10-23 DIAGNOSIS — M6289 Other specified disorders of muscle: Secondary | ICD-10-CM | POA: Diagnosis not present

## 2020-10-23 HISTORY — PX: REVERSE SHOULDER ARTHROPLASTY: SHX5054

## 2020-10-23 SURGERY — ARTHROPLASTY, SHOULDER, TOTAL, REVERSE
Anesthesia: Regional | Site: Shoulder | Laterality: Left

## 2020-10-23 MED ORDER — LEVOTHYROXINE SODIUM 125 MCG PO TABS
125.0000 ug | ORAL_TABLET | Freq: Every day | ORAL | Status: DC
  Administered 2020-10-24: 125 ug via ORAL
  Filled 2020-10-23: qty 1

## 2020-10-23 MED ORDER — DEXAMETHASONE SODIUM PHOSPHATE 10 MG/ML IJ SOLN
INTRAMUSCULAR | Status: DC | PRN
  Administered 2020-10-23: 10 mg via INTRAVENOUS

## 2020-10-23 MED ORDER — STERILE WATER FOR IRRIGATION IR SOLN
Status: DC | PRN
  Administered 2020-10-23: 2000 mL

## 2020-10-23 MED ORDER — ACETAMINOPHEN 325 MG PO TABS
325.0000 mg | ORAL_TABLET | Freq: Four times a day (QID) | ORAL | Status: DC | PRN
  Filled 2020-10-23: qty 2

## 2020-10-23 MED ORDER — LIDOCAINE 2% (20 MG/ML) 5 ML SYRINGE
INTRAMUSCULAR | Status: DC | PRN
  Administered 2020-10-23: 60 mg via INTRAVENOUS

## 2020-10-23 MED ORDER — VITAMIN D 25 MCG (1000 UNIT) PO TABS
2000.0000 [IU] | ORAL_TABLET | Freq: Every day | ORAL | Status: DC
  Administered 2020-10-24: 2000 [IU] via ORAL
  Filled 2020-10-23: qty 2

## 2020-10-23 MED ORDER — OXYCODONE HCL 5 MG PO TABS: 5.0000 mg | ORAL_TABLET | Freq: Once | ORAL | Status: DC | PRN

## 2020-10-23 MED ORDER — ORAL CARE MOUTH RINSE: 15.0000 mL | Freq: Once | OROMUCOSAL | Status: AC

## 2020-10-23 MED ORDER — TRAMADOL HCL 50 MG PO TABS: 50.0000 mg | ORAL_TABLET | Freq: Four times a day (QID) | ORAL | Status: DC | PRN

## 2020-10-23 MED ORDER — HYPROMELLOSE (GONIOSCOPIC) 2.5 % OP SOLN: 1.0000 [drp] | Freq: Every day | OPHTHALMIC | Status: DC

## 2020-10-23 MED ORDER — DORZOLAMIDE HCL-TIMOLOL MAL 2-0.5 % OP SOLN
1.0000 [drp] | Freq: Two times a day (BID) | OPHTHALMIC | Status: DC
  Administered 2020-10-23 – 2020-10-24 (×2): 1 [drp] via OPHTHALMIC
  Filled 2020-10-23: qty 10

## 2020-10-23 MED ORDER — PANTOPRAZOLE SODIUM 40 MG PO TBEC
40.0000 mg | DELAYED_RELEASE_TABLET | Freq: Every day | ORAL | Status: DC
  Administered 2020-10-24: 40 mg via ORAL
  Filled 2020-10-23: qty 1

## 2020-10-23 MED ORDER — PROPOFOL 10 MG/ML IV BOLUS
INTRAVENOUS | Status: AC
  Filled 2020-10-23: qty 20

## 2020-10-23 MED ORDER — PHENYLEPHRINE HCL (PRESSORS) 10 MG/ML IV SOLN
INTRAVENOUS | Status: AC
  Filled 2020-10-23: qty 1

## 2020-10-23 MED ORDER — CHLORHEXIDINE GLUCONATE 0.12 % MT SOLN
15.0000 mL | Freq: Once | OROMUCOSAL | Status: AC
  Administered 2020-10-23: 15 mL via OROMUCOSAL

## 2020-10-23 MED ORDER — TRIAMTERENE-HCTZ 37.5-25 MG PO TABS
1.0000 | ORAL_TABLET | Freq: Every day | ORAL | Status: DC
  Administered 2020-10-23 – 2020-10-24 (×2): 1 via ORAL
  Filled 2020-10-23 (×2): qty 1

## 2020-10-23 MED ORDER — DEXAMETHASONE SODIUM PHOSPHATE 10 MG/ML IJ SOLN
INTRAMUSCULAR | Status: AC
  Filled 2020-10-23: qty 1

## 2020-10-23 MED ORDER — TRAMADOL HCL 50 MG PO TABS: 50.0000 mg | ORAL_TABLET | Freq: Four times a day (QID) | ORAL | 0 refills | Status: DC | PRN

## 2020-10-23 MED ORDER — BUPIVACAINE HCL (PF) 0.5 % IJ SOLN
INTRAMUSCULAR | Status: DC | PRN
  Administered 2020-10-23: 15 mL via PERINEURAL

## 2020-10-23 MED ORDER — BUPIVACAINE LIPOSOME 1.3 % IJ SUSP
INTRAMUSCULAR | Status: DC | PRN
  Administered 2020-10-23: 10 mL via PERINEURAL

## 2020-10-23 MED ORDER — PHENYLEPHRINE 40 MCG/ML (10ML) SYRINGE FOR IV PUSH (FOR BLOOD PRESSURE SUPPORT)
PREFILLED_SYRINGE | INTRAVENOUS | Status: AC
  Filled 2020-10-23: qty 10

## 2020-10-23 MED ORDER — POLYETHYLENE GLYCOL 3350 17 G PO PACK: 17.0000 g | PACK | Freq: Every day | ORAL | Status: DC | PRN

## 2020-10-23 MED ORDER — PANTOPRAZOLE SODIUM 40 MG PO TBEC: 40.0000 mg | DELAYED_RELEASE_TABLET | Freq: Every day | ORAL | Status: DC

## 2020-10-23 MED ORDER — SODIUM CHLORIDE 0.9 % IR SOLN
Status: DC | PRN
  Administered 2020-10-23: 1000 mL

## 2020-10-23 MED ORDER — MIDAZOLAM HCL 2 MG/2ML IJ SOLN
INTRAMUSCULAR | Status: AC
  Filled 2020-10-23: qty 2

## 2020-10-23 MED ORDER — PHENYLEPHRINE HCL (PRESSORS) 10 MG/ML IV SOLN
INTRAVENOUS | Status: DC | PRN
  Administered 2020-10-23: 80 ug via INTRAVENOUS

## 2020-10-23 MED ORDER — SIMETHICONE 80 MG PO CHEW: 160.0000 mg | CHEWABLE_TABLET | Freq: Four times a day (QID) | ORAL | Status: DC | PRN

## 2020-10-23 MED ORDER — CEFAZOLIN SODIUM-DEXTROSE 2-4 GM/100ML-% IV SOLN
2.0000 g | INTRAVENOUS | Status: AC
  Administered 2020-10-23: 2 g via INTRAVENOUS
  Filled 2020-10-23: qty 100

## 2020-10-23 MED ORDER — OXYCODONE HCL 5 MG/5ML PO SOLN
5.0000 mg | Freq: Once | ORAL | Status: DC | PRN
Start: 2020-10-23 — End: 2020-10-23

## 2020-10-23 MED ORDER — METHOCARBAMOL 500 MG IVPB - SIMPLE MED
500.0000 mg | Freq: Four times a day (QID) | INTRAVENOUS | Status: DC | PRN
  Filled 2020-10-23: qty 50

## 2020-10-23 MED ORDER — CEFAZOLIN SODIUM-DEXTROSE 2-4 GM/100ML-% IV SOLN
2.0000 g | Freq: Four times a day (QID) | INTRAVENOUS | Status: AC
Start: 2020-10-23 — End: 2020-10-24
  Administered 2020-10-23 – 2020-10-24 (×3): 2 g via INTRAVENOUS
  Filled 2020-10-23 (×3): qty 100

## 2020-10-23 MED ORDER — BUPIVACAINE-EPINEPHRINE (PF) 0.25% -1:200000 IJ SOLN
INTRAMUSCULAR | Status: DC | PRN
  Administered 2020-10-23: 15 mL

## 2020-10-23 MED ORDER — CARBOXYMETHYLCELLULOSE SODIUM 1 % OP GEL: 1.0000 [drp] | Freq: Every day | OPHTHALMIC | Status: DC

## 2020-10-23 MED ORDER — FENTANYL CITRATE (PF) 100 MCG/2ML IJ SOLN
INTRAMUSCULAR | Status: DC | PRN
  Administered 2020-10-23: 50 ug via INTRAVENOUS

## 2020-10-23 MED ORDER — PHENYLEPHRINE HCL-NACL 20-0.9 MG/250ML-% IV SOLN
INTRAVENOUS | Status: DC | PRN
  Administered 2020-10-23: 20 ug/min via INTRAVENOUS

## 2020-10-23 MED ORDER — METOCLOPRAMIDE HCL 5 MG/ML IJ SOLN: 5.0000 mg | Freq: Three times a day (TID) | INTRAMUSCULAR | Status: DC | PRN

## 2020-10-23 MED ORDER — ESTRADIOL 1 MG PO TABS
1.0000 mg | ORAL_TABLET | Freq: Every day | ORAL | Status: DC
  Administered 2020-10-23 – 2020-10-24 (×2): 1 mg via ORAL
  Filled 2020-10-23 (×2): qty 1

## 2020-10-23 MED ORDER — MIDAZOLAM HCL 5 MG/5ML IJ SOLN
INTRAMUSCULAR | Status: DC | PRN
  Administered 2020-10-23: 1 mg via INTRAVENOUS

## 2020-10-23 MED ORDER — METHOCARBAMOL 500 MG PO TABS: 500.0000 mg | ORAL_TABLET | Freq: Four times a day (QID) | ORAL | Status: DC | PRN

## 2020-10-23 MED ORDER — DOCUSATE SODIUM 100 MG PO CAPS
100.0000 mg | ORAL_CAPSULE | Freq: Two times a day (BID) | ORAL | Status: DC
  Administered 2020-10-23 – 2020-10-24 (×2): 100 mg via ORAL
  Filled 2020-10-23 (×2): qty 1

## 2020-10-23 MED ORDER — ROCURONIUM BROMIDE 10 MG/ML (PF) SYRINGE
PREFILLED_SYRINGE | INTRAVENOUS | Status: DC | PRN
  Administered 2020-10-23: 50 mg via INTRAVENOUS

## 2020-10-23 MED ORDER — ONDANSETRON HCL 4 MG PO TABS: 4.0000 mg | ORAL_TABLET | Freq: Four times a day (QID) | ORAL | Status: DC | PRN

## 2020-10-23 MED ORDER — ROSUVASTATIN CALCIUM 10 MG PO TABS
10.0000 mg | ORAL_TABLET | Freq: Every day | ORAL | Status: DC
  Administered 2020-10-23: 10 mg via ORAL
  Filled 2020-10-23: qty 1

## 2020-10-23 MED ORDER — ACETAMINOPHEN 500 MG PO TABS: 1000.0000 mg | ORAL_TABLET | Freq: Once | ORAL | Status: DC

## 2020-10-23 MED ORDER — METOCLOPRAMIDE HCL 5 MG PO TABS: 5.0000 mg | ORAL_TABLET | Freq: Three times a day (TID) | ORAL | Status: DC | PRN

## 2020-10-23 MED ORDER — ROCURONIUM BROMIDE 10 MG/ML (PF) SYRINGE
PREFILLED_SYRINGE | INTRAVENOUS | Status: AC
  Filled 2020-10-23: qty 10

## 2020-10-23 MED ORDER — APREPITANT 40 MG PO CAPS: 40.0000 mg | ORAL_CAPSULE | Freq: Once | ORAL | Status: DC

## 2020-10-23 MED ORDER — PHENOL 1.4 % MT LIQD: 1.0000 | OROMUCOSAL | Status: DC | PRN

## 2020-10-23 MED ORDER — ONDANSETRON HCL 4 MG/2ML IJ SOLN
INTRAMUSCULAR | Status: AC
  Filled 2020-10-23: qty 2

## 2020-10-23 MED ORDER — HYDROMORPHONE HCL 1 MG/ML IJ SOLN: 0.2500 mg | INTRAMUSCULAR | Status: DC | PRN

## 2020-10-23 MED ORDER — ACETAMINOPHEN 500 MG PO TABS
1000.0000 mg | ORAL_TABLET | Freq: Three times a day (TID) | ORAL | Status: DC
  Administered 2020-10-23 – 2020-10-24 (×3): 1000 mg via ORAL
  Filled 2020-10-23 (×3): qty 2

## 2020-10-23 MED ORDER — LIDOCAINE 2% (20 MG/ML) 5 ML SYRINGE
INTRAMUSCULAR | Status: AC
  Filled 2020-10-23: qty 5

## 2020-10-23 MED ORDER — POLYVINYL ALCOHOL 1.4 % OP SOLN
1.0000 [drp] | OPHTHALMIC | Status: DC | PRN
  Filled 2020-10-23: qty 15

## 2020-10-23 MED ORDER — ONDANSETRON HCL 4 MG/2ML IJ SOLN: 4.0000 mg | Freq: Once | INTRAMUSCULAR | Status: DC | PRN

## 2020-10-23 MED ORDER — GABAPENTIN 300 MG PO CAPS
300.0000 mg | ORAL_CAPSULE | Freq: Three times a day (TID) | ORAL | Status: DC
  Administered 2020-10-23 – 2020-10-24 (×3): 300 mg via ORAL
  Filled 2020-10-23 (×3): qty 1

## 2020-10-23 MED ORDER — ONDANSETRON HCL 4 MG/2ML IJ SOLN: 4.0000 mg | Freq: Four times a day (QID) | INTRAMUSCULAR | Status: DC | PRN

## 2020-10-23 MED ORDER — AMISULPRIDE (ANTIEMETIC) 5 MG/2ML IV SOLN: 10.0000 mg | Freq: Once | INTRAVENOUS | Status: DC | PRN

## 2020-10-23 MED ORDER — FENTANYL CITRATE (PF) 250 MCG/5ML IJ SOLN
INTRAMUSCULAR | Status: AC
  Filled 2020-10-23: qty 5

## 2020-10-23 MED ORDER — PROPOFOL 10 MG/ML IV BOLUS
INTRAVENOUS | Status: DC | PRN
  Administered 2020-10-23: 150 mg via INTRAVENOUS

## 2020-10-23 MED ORDER — VASOPRESSIN 20 UNIT/ML IV SOLN
INTRAVENOUS | Status: AC
  Filled 2020-10-23: qty 1

## 2020-10-23 MED ORDER — ONDANSETRON HCL 4 MG/2ML IJ SOLN
INTRAMUSCULAR | Status: DC | PRN
  Administered 2020-10-23: 4 mg via INTRAVENOUS

## 2020-10-23 MED ORDER — SODIUM CHLORIDE 0.9 % IV SOLN: INTRAVENOUS | Status: DC

## 2020-10-23 MED ORDER — BUPIVACAINE-EPINEPHRINE (PF) 0.25% -1:200000 IJ SOLN
INTRAMUSCULAR | Status: AC
  Filled 2020-10-23: qty 30

## 2020-10-23 MED ORDER — MENTHOL 3 MG MT LOZG: 1.0000 | LOZENGE | OROMUCOSAL | Status: DC | PRN

## 2020-10-23 MED ORDER — LACTATED RINGERS IV SOLN: INTRAVENOUS | Status: DC

## 2020-10-23 SURGICAL SUPPLY — 71 items
BAG COUNTER SPONGE SURGICOUNT (BAG) IMPLANT
BAG ZIPLOCK 12X15 (MISCELLANEOUS) IMPLANT
BIT DRILL 1.6MX128 (BIT) IMPLANT
BIT DRILL 170X2.5X (BIT) ×1 IMPLANT
BIT DRL 170X2.5X (BIT) ×1
BLADE SAG 18X100X1.27 (BLADE) ×2 IMPLANT
COVER BACK TABLE 60X90IN (DRAPES) ×2 IMPLANT
COVER SURGICAL LIGHT HANDLE (MISCELLANEOUS) ×2 IMPLANT
CUP D38 DXTEND STAND PLUS 6 HU (Orthopedic Implant) ×2 IMPLANT
CUP STD D38 DXTEND PLUS 6 HU (Orthopedic Implant) ×1 IMPLANT
DECANTER SPIKE VIAL GLASS SM (MISCELLANEOUS) ×2 IMPLANT
DRAPE INCISE IOBAN 66X45 STRL (DRAPES) ×2 IMPLANT
DRAPE ORTHO SPLIT 77X108 STRL (DRAPES) ×2
DRAPE SHEET LG 3/4 BI-LAMINATE (DRAPES) ×2 IMPLANT
DRAPE SURG ORHT 6 SPLT 77X108 (DRAPES) ×2 IMPLANT
DRAPE TOP 10253 STERILE (DRAPES) ×2 IMPLANT
DRAPE U-SHAPE 47X51 STRL (DRAPES) ×2 IMPLANT
DRILL 2.5 (BIT) ×1
DRSG ADAPTIC 3X8 NADH LF (GAUZE/BANDAGES/DRESSINGS) ×2 IMPLANT
DRSG PAD ABDOMINAL 8X10 ST (GAUZE/BANDAGES/DRESSINGS) ×2 IMPLANT
DURAPREP 26ML APPLICATOR (WOUND CARE) ×2 IMPLANT
ELECT BLADE TIP CTD 4 INCH (ELECTRODE) ×2 IMPLANT
ELECT NEEDLE TIP 2.8 STRL (NEEDLE) ×2 IMPLANT
ELECT REM PT RETURN 15FT ADLT (MISCELLANEOUS) ×2 IMPLANT
EPI LT SZ 1 (Orthopedic Implant) ×2 IMPLANT
EPIPHYSIS LT SZ 1 (Orthopedic Implant) ×1 IMPLANT
FACESHIELD WRAPAROUND (MASK) ×2 IMPLANT
GAUZE SPONGE 4X4 12PLY STRL (GAUZE/BANDAGES/DRESSINGS) ×2 IMPLANT
GLENOSPHERE DELTA XTEND LAT 38 (Miscellaneous) ×2 IMPLANT
GLOVE SURG ORTHO LTX SZ7.5 (GLOVE) ×2 IMPLANT
GLOVE SURG ORTHO LTX SZ8.5 (GLOVE) ×2 IMPLANT
GLOVE SURG UNDER POLY LF SZ7.5 (GLOVE) ×2 IMPLANT
GLOVE SURG UNDER POLY LF SZ8.5 (GLOVE) ×2 IMPLANT
GOWN STRL REUS W/TWL XL LVL3 (GOWN DISPOSABLE) ×4 IMPLANT
KIT BASIN OR (CUSTOM PROCEDURE TRAY) ×2 IMPLANT
KIT TURNOVER KIT A (KITS) ×2 IMPLANT
MANIFOLD NEPTUNE II (INSTRUMENTS) ×2 IMPLANT
METAGLENE DELTA EXTEND (Trauma) ×1 IMPLANT
METAGLENE DXTEND (Trauma) ×2 IMPLANT
NEEDLE MAYO CATGUT SZ4 (NEEDLE) IMPLANT
NS IRRIG 1000ML POUR BTL (IV SOLUTION) ×2 IMPLANT
PACK SHOULDER (CUSTOM PROCEDURE TRAY) ×2 IMPLANT
PENCIL SMOKE EVACUATOR (MISCELLANEOUS) IMPLANT
PIN GUIDE 1.2 (PIN) ×2 IMPLANT
PIN GUIDE GLENOPHERE 1.5MX300M (PIN) ×2 IMPLANT
PIN METAGLENE 2.5 (PIN) ×2 IMPLANT
PROTECTOR NERVE ULNAR (MISCELLANEOUS) ×2 IMPLANT
RESTRAINT HEAD UNIVERSAL NS (MISCELLANEOUS) ×2 IMPLANT
SCREW 4.5X18MM (Screw) ×1 IMPLANT
SCREW BN 18X4.5XSTRL SHLDR (Screw) ×1 IMPLANT
SCREW LOCK 42 (Screw) ×2 IMPLANT
SCREW LOCK DELTA XTEND 4.5X30 (Screw) ×2 IMPLANT
SLING ARM FOAM STRAP LRG (SOFTGOODS) ×2 IMPLANT
SMARTMIX MINI TOWER (MISCELLANEOUS)
SPONGE T-LAP 4X18 ~~LOC~~+RFID (SPONGE) IMPLANT
STEM HUMERAL SZ8 STANDARD (Stem) ×2 IMPLANT
STEM HUMERAL SZ8 STD (Stem) ×1 IMPLANT
STRIP CLOSURE SKIN 1/2X4 (GAUZE/BANDAGES/DRESSINGS) ×2 IMPLANT
SUCTION FRAZIER HANDLE 10FR (MISCELLANEOUS) ×1
SUCTION TUBE FRAZIER 10FR DISP (MISCELLANEOUS) ×1 IMPLANT
SUT FIBERWIRE #2 38 T-5 BLUE (SUTURE) ×4
SUT MNCRL AB 4-0 PS2 18 (SUTURE) ×2 IMPLANT
SUT VIC AB 0 CT1 36 (SUTURE) ×4 IMPLANT
SUT VIC AB 0 CT2 27 (SUTURE) ×2 IMPLANT
SUT VIC AB 2-0 CT1 27 (SUTURE) ×1
SUT VIC AB 2-0 CT1 TAPERPNT 27 (SUTURE) ×1 IMPLANT
SUTURE FIBERWR #2 38 T-5 BLUE (SUTURE) ×2 IMPLANT
TAPE CLOTH SURG 6X10 WHT LF (GAUZE/BANDAGES/DRESSINGS) ×2 IMPLANT
TAPE STRIPS DRAPE STRL (GAUZE/BANDAGES/DRESSINGS) ×2 IMPLANT
TOWEL OR 17X26 10 PK STRL BLUE (TOWEL DISPOSABLE) ×2 IMPLANT
TOWER SMARTMIX MINI (MISCELLANEOUS) IMPLANT

## 2020-10-23 NOTE — Transfer of Care (Signed)
Immediate Anesthesia Transfer of Care Note  Patient: Tracy Hunt  Procedure(s) Performed: REVERSE SHOULDER ARTHROPLASTY (Left: Shoulder)  Patient Location: PACU  Anesthesia Type:General  Level of Consciousness: awake, alert , oriented and patient cooperative  Airway & Oxygen Therapy: Patient Spontanous Breathing and Patient connected to face mask oxygen  Post-op Assessment: Report given to RN, Post -op Vital signs reviewed and stable and Patient moving all extremities X 4  Post vital signs: stable  Last Vitals:  Vitals Value Taken Time  BP 135/75 10/23/20 1215  Temp 36.4 C 10/23/20 1145  Pulse 56 10/23/20 1047  Resp 18 10/23/20 1230  SpO2 97 % 10/23/20 1200  Vitals shown include unvalidated device data.  Last Pain:  Vitals:   10/23/20 1200  TempSrc:   PainSc: 0-No pain         Complications: No notable events documented.

## 2020-10-23 NOTE — Interval H&P Note (Signed)
History and Physical Interval Note:  10/23/2020 7:20 AM  Tracy Hunt  has presented today for surgery, with the diagnosis of left shoulder end stage osteoarthritis.  The various methods of treatment have been discussed with the patient and family. After consideration of risks, benefits and other options for treatment, the patient has consented to  Procedure(s) with comments: REVERSE SHOULDER ARTHROPLASTY (Left) - with ISB as a surgical intervention.  The patient's history has been reviewed, patient examined, no change in status, stable for surgery.  I have reviewed the patient's chart and labs.  Questions were answered to the patient's satisfaction.     Augustin Schooling

## 2020-10-23 NOTE — Op Note (Signed)
NAME: Tracy Hunt. MEDICAL RECORD NO: [id number] ACCOUNT NO: [id number] DATE OF BIRTH: February 08, 1946 FACILITY: [facility] LOCATION: [facility] PHYSICIAN: Doran Heater. 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  PROCEDURE PERFORMED:  Left reverse shoulder replacement with DePuy Delta Xtend prosthesis with subscap repair.  ATTENDING SURGEON:  Doran Heater. Veverly Fells, MD.  ASSISTANT:  Charletta Cousin [place], [place], who was scrubbed during the entire procedure, and necessary for satisfactory completion of surgery.  ANESTHESIA:  General anesthesia was used plus interscalene block.  ESTIMATED BLOOD LOSS:  Less than 50 mL  FLUID REPLACEMENT:  1500 mL crystalloid.  COUNT:  Instrument counts were correct.   COMPLICATIONS:  No complications.   ANTIBIOTICS: Perioperative antibiotics were given.  INDICATIONS:  The patient is a 75 year old female with worsening left shoulder pain and dysfunction secondary to end-stage arthritis.  The patient has had progressive functional loss as well, indicating rotator cuff insufficiency.  Given the failure of  conservative management, the patient desires operative treatment to restore function and eliminate pain.  Informed consent was obtained.  DESCRIPTION OF PROCEDURE:  After an adequate level of anesthesia was achieved, the patient was positioned in the modified beach chair position.  Left shoulder correctly identified and sterilely prepped and draped in the usual manner.  Timeout called,  verifying correct patient, correct site.  We entered the patient's shoulder using a standard deltopectoral approach, starting at the coracoid process extending down to the anterior humerus, dissection down through subcutaneous tissues using the Bovie.   We identified the cephalic vein  and took that laterally with the deltoid.  Pectoralis was taken medially.  Conjoined tendon identified and retracted medially.  We tenodesed the biceps in situ with 0 Vicryl figure-of-eight suture.  We then went ahead and  released the subscapularis subperiosteally off the lesser tuberosity and tagged with #2 FiberWire for repair.  We released the inferior capsule progressively externally rotating and delivering the humeral head out of the wound, we did have to release  some of the anterior rotator cuff including supraspinatus and infraspinatus.  The humeral head was devoid of cartilage.  We entered the proximal humerus with a 6 mm reamer, reaming up to a size 8 before we encountered cortical bite.  We then placed our 8  mm T-handled intramedullary guide and resected the humeral head to 10 degrees of retroversion with the oscillating saw. We removed excess osteophytes with a rongeur.  We took the head to the back table to use for bone graft.  Next, we subluxed the  humerus posteriorly.  We were able to gain good exposure of the glenoid face.  There was advanced arthritis noted with severe synovitis.  We performed a capsulectomy.  We were careful to protect the axillary nerve.  We also removed the labrum.  We  identified the center point for a guide pin, placed our guide pin and then reamed for the metaglene baseplate down to subchondral bone.  We then used our peripheral hand reamer, then used the central drill to drill out the peg hole.  We impacted the  metaglene baseplate into position for this DePuy Delta Xtend prosthesis.  We placed a 42 screw inferiorly, a 30 screw at the base of the coracoid and then 18 nonlocked posteriorly.  We locked our peripheral screws.  We then placed a 38 standard  glenosphere onto  the baseplate, secured that, we did a finger sweep to make sure no soft tissue was caught up in that bearing.  I removed excess rotator cuff tissue.  We then went ahead and finished our humeral  preparation reaming for the 1 left  metaphysis.  We then placed our trial with the 8 stem, 1 left metaphysis set on the 0 setting and placed in 10 degrees retroversion.  We reduced the head with a 38+6 poly trial, which was perfect in terms of soft tissue balancing.  We removed the trial  components, irrigated thoroughly, placed drill holes and suture for repair of the subscap and then used to fill bone grafting and did impaction grafting technique with the Porocoat 8 stem with the HA-coated press-fit 1 left metaphysis.  Again, set on the  0 setting and placed in 10 degrees of retroversion.  We impacted that then with some bone graft and we were happy with her stem security.  We then selected the 38+6 poly, impacted down on to the tray of the humeral device and then reduced the shoulder.   We were again happy with appropriate conjoined tensioning and no gapping with inferior pull or external rotation.  No impingement.  We then irrigated thoroughly and repaired the subscapularis anatomically back to the lesser tuberosity.  We had a nice  low-profile repair.  This did not compromise motion or cause any impingement.  We then irrigated again and repaired the deltopectoral interval with 0 Vicryl suture followed by 2-0 Vicryl for subcutaneous closure and 4-0 Monocryl for skin.  Steri-Strips  were applied followed by sterile dressing.  The patient tolerated surgery well.   PAA D: 10/23/2020 9:35:57 am T: 10/23/2020 10:05:00 am  JOB: [id number]

## 2020-10-23 NOTE — Brief Op Note (Signed)
10/23/2020  9:29 AM  PATIENT:  Tracy Hunt  75 y.o. female  PRE-OPERATIVE DIAGNOSIS:  left shoulder end stage osteoarthritis, rotator cuff insufficiency  POST-OPERATIVE DIAGNOSIS:  left shoulder end stage osteoarthritis, rotator cuff insufficiency  PROCEDURE:  Procedure(s) with comments: REVERSE SHOULDER ARTHROPLASTY (Left) - with ISB DePuy Delta Xtend with Subscap repair  SURGEON:  Surgeon(s) and Role:    Netta Cedars, MD - Primary  PHYSICIAN ASSISTANT:   ASSISTANTS: [place] Bruns, PA-C   ANESTHESIA:   regional and general  EBL:  50 mL   BLOOD ADMINISTERED:none  DRAINS: none   LOCAL MEDICATIONS USED:  MARCAINE     SPECIMEN:  No Specimen  DISPOSITION OF SPECIMEN:  N/A  COUNTS:  YES  TOURNIQUET:  * No tourniquets in log *  DICTATION: .Other Dictation: Dictation Number [id number]  PLAN OF CARE: Admit for overnight observation  PATIENT DISPOSITION:  PACU - hemodynamically stable.   Delay start of Pharmacological VTE agent (>24hrs) due to surgical blood loss or risk of bleeding: not applicable

## 2020-10-23 NOTE — Discharge Instructions (Signed)
Ice to the shoulder constantly.  Keep the incision covered and clean and dry for one week, then ok to get it wet in the shower.  Do exercise as instructed several times per day. Be careful not to overdo!  DO NOT reach behind your back or push up out of a chair with the operative arm.  Use a sling while you are up and around for comfort, may remove while seated.  Keep pillow propped behind the operative elbow.  Follow up with Dr Veverly Fells in two weeks in the office, call [phone] for appt

## 2020-10-23 NOTE — Anesthesia Postprocedure Evaluation (Signed)
Anesthesia Post Note  Patient: Tracy Hunt  Procedure(s) Performed: REVERSE SHOULDER ARTHROPLASTY (Left: Shoulder)     Patient location during evaluation: PACU Anesthesia Type: Regional and General Level of consciousness: awake and alert, oriented and patient cooperative Pain management: pain level controlled Vital Signs Assessment: post-procedure vital signs reviewed and stable Respiratory status: spontaneous breathing, nonlabored ventilation and respiratory function stable Cardiovascular status: blood pressure returned to baseline and stable Postop Assessment: no apparent nausea or vomiting Anesthetic complications: no   No notable events documented.  Last Vitals:  Vitals:   10/23/20 1145 10/23/20 1200  BP: (!) 142/75 130/74  Pulse:    Resp: 16 19  Temp: 36.4 C   SpO2: 98% 97%    Last Pain:  Vitals:   10/23/20 1200  TempSrc:   PainSc: 0-No pain                 Pervis Hocking

## 2020-10-23 NOTE — Anesthesia Procedure Notes (Addendum)
Procedure Name: Intubation Date/Time: 10/23/2020 7:36 AM Performed by: Lissa Morales, CRNA Pre-anesthesia Checklist: Patient identified, Emergency Drugs available, Suction available and Patient being monitored Patient Re-evaluated:Patient Re-evaluated prior to induction Oxygen Delivery Method: Circle system utilized Preoxygenation: Pre-oxygenation with 100% oxygen Induction Type: IV induction Ventilation: Mask ventilation without difficulty Laryngoscope Size: Mac and 4 Grade View: Grade II Tube type: Oral Number of attempts: 1 Airway Equipment and Method: Stylet and Oral airway Placement Confirmation: ETT inserted through vocal cords under direct vision, positive ETCO2 and breath sounds checked- equal and bilateral Tube secured with: Tape Dental Injury: Injury to lip  Difficulty Due To: Difficult Airway- due to anterior larynx

## 2020-10-23 NOTE — Anesthesia Preprocedure Evaluation (Addendum)
Anesthesia Evaluation  Patient identified by MRN, date of birth, ID band Patient awake    Reviewed: Allergy & Precautions, NPO status , Patient's Chart, lab work & pertinent test results  History of Anesthesia Complications (+) PONV, PROLONGED EMERGENCE and history of anesthetic complications  Airway Mallampati: III  TM Distance: >3 FB Neck ROM: Full    Dental  (+) Teeth Intact, Dental Advisory Given, Missing,    Pulmonary neg pulmonary ROS,    Pulmonary exam normal breath sounds clear to auscultation       Cardiovascular hypertension, Pt. on medications Normal cardiovascular exam Rhythm:Regular Rate:Normal     Neuro/Psych negative neurological ROS  negative psych ROS   GI/Hepatic Neg liver ROS, GERD  Medicated and Controlled,  Endo/Other  diabetes (pre-diabetic)Hypothyroidism Obesity BMI 34  Renal/GU negative Renal ROS  negative genitourinary   Musculoskeletal  (+) Arthritis , Osteoarthritis,  L shoulder OA   Abdominal (+) + obese,   Peds  Hematology negative hematology ROS (+) hct 44.6   Anesthesia Other Findings   Reproductive/Obstetrics negative OB ROS                            Anesthesia Physical Anesthesia Plan  ASA: 2  Anesthesia Plan: General and Regional   Post-op Pain Management: GA combined w/ Regional for post-op pain   Induction: Intravenous  PONV Risk Score and Plan: Ondansetron, Dexamethasone, Midazolam and Treatment may vary due to age or medical condition  Airway Management Planned: Oral ETT  Additional Equipment: None  Intra-op Plan:   Post-operative Plan: Extubation in OR  Informed Consent: I have reviewed the patients History and Physical, chart, labs and discussed the procedure including the risks, benefits and alternatives for the proposed anesthesia with the patient or authorized representative who has indicated his/her understanding and acceptance.      Dental advisory given  Plan Discussed with: CRNA  Anesthesia Plan Comments:         Anesthesia Quick Evaluation

## 2020-10-23 NOTE — Anesthesia Procedure Notes (Signed)
Anesthesia Regional Block: Interscalene brachial plexus block   Pre-Anesthetic Checklist: , timeout performed,  Correct Patient, Correct Site, Correct Laterality,  Correct Procedure, Correct Position, site marked,  Risks and benefits discussed,  Surgical consent,  Pre-op evaluation,  At surgeon's request and post-op pain management  Laterality: Left  Prep: Maximum Sterile Barrier Precautions used, chloraprep       Needles:  Injection technique: Single-shot  Needle Type: Echogenic Stimulator Needle     Needle Length: 9cm  Needle Gauge: 22     Additional Needles:   Procedures:,,,, ultrasound used (permanent image in chart),,    Narrative:  Start time: 10/23/2020 7:00 AM End time: 10/23/2020 7:10 AM Injection made incrementally with aspirations every 5 mL.  Performed by: Personally  Anesthesiologist: Pervis Hocking, DO  Additional Notes: Monitors applied. No increased pain on injection. No increased resistance to injection. Injection made in 5cc increments. Good needle visualization. Patient tolerated procedure well.

## 2020-10-24 DIAGNOSIS — E039 Hypothyroidism, unspecified: Secondary | ICD-10-CM | POA: Diagnosis not present

## 2020-10-24 DIAGNOSIS — M19012 Primary osteoarthritis, left shoulder: Secondary | ICD-10-CM | POA: Diagnosis not present

## 2020-10-24 DIAGNOSIS — I10 Essential (primary) hypertension: Secondary | ICD-10-CM | POA: Diagnosis not present

## 2020-10-24 DIAGNOSIS — M75102 Unspecified rotator cuff tear or rupture of left shoulder, not specified as traumatic: Secondary | ICD-10-CM | POA: Diagnosis not present

## 2020-10-24 DIAGNOSIS — R7303 Prediabetes: Secondary | ICD-10-CM | POA: Diagnosis not present

## 2020-10-24 LAB — BASIC METABOLIC PANEL
Anion gap: 8 (ref 5–15)
BUN: 15 mg/dL (ref 8–23)
CO2: 25 mmol/L (ref 22–32)
Calcium: 8.9 mg/dL (ref 8.9–10.3)
Chloride: 101 mmol/L (ref 98–111)
Creatinine, Ser: 0.76 mg/dL (ref 0.44–1.00)
GFR, Estimated: >60 mL/min (ref >60)
Glucose, Bld: 181 mg/dL — ABNORMAL HIGH (ref 70–99)
Potassium: 2.9 mmol/L — ABNORMAL LOW (ref 3.5–5.1)
Sodium: 134 mmol/L — ABNORMAL LOW (ref 135–145)

## 2020-10-24 LAB — HEMOGLOBIN AND HEMATOCRIT, BLOOD
HCT: 37.2 % (ref 36.0–46.0)
Hemoglobin: 12.7 g/dL (ref 12.0–15.0)

## 2020-10-24 NOTE — Progress Notes (Signed)
Orthopedics Progress Note  Subjective: Comfortable this AM  Objective:  Vitals:   10/24/20 0149 10/24/20 0527  BP: 121/67 125/80  Pulse: 87 89  Resp: 16 16  Temp: 98.2 F (36.8 C) 98.4 F (36.9 C)  SpO2:  96%    General: Awake and alert  Musculoskeletal: incision healing well, dressing changed Neurovascularly intact  Lab Results  Component Value Date   WBC 4.5 10/12/2020   HGB 12.7 10/24/2020   HCT 37.2 10/24/2020   MCV 96.7 10/12/2020   PLT 152 10/12/2020       Component Value Date/Time   NA 134 (L) 10/24/2020 0352   K 2.9 (L) 10/24/2020 0352   CL 101 10/24/2020 0352   CO2 25 10/24/2020 0352   GLUCOSE 181 (H) 10/24/2020 0352   BUN 15 10/24/2020 0352   CREATININE 0.76 10/24/2020 0352   CALCIUM 8.9 10/24/2020 0352   GFRNONAA >60 10/24/2020 0352    No results found for: INR, PROTIME  Assessment/Plan: POD #1 s/p Procedure(s): REVERSE SHOULDER ARTHROPLASTY Stable for discharge to home Follow up in two weeks  Doran Heater. Veverly Fells, MD 10/24/2020 9:28 AM

## 2020-10-24 NOTE — Discharge Summary (Signed)
In most cases prophylactic antibiotics for Dental procdeures after total joint surgery are not necessary.  Exceptions are as follows:  1. History of prior total joint infection  2. Severely immunocompromised (Organ Transplant, cancer chemotherapy, Rheumatoid biologic meds such as [place])  3. Poorly controlled diabetes (A1C &gt; 8.0, blood glucose over 200)  If you have one of these conditions, contact your surgeon for an antibiotic prescription, prior to your dental procedure. Orthopedic Discharge Summary        Physician Discharge Summary  Patient ID: Tracy Hunt MRN: [id number] DOB/AGE: [birth year] 75 y.o.  Admit date: 10/23/2020 Discharge date: 10/24/2020   Procedures:  Procedure(s) (LRB): REVERSE SHOULDER ARTHROPLASTY (Left)  Attending Physician:  Dr. Esmond Plants  Admission Diagnoses:   left shoulder OA, rotator cuff insufficency  Discharge Diagnoses:  same   Past Medical History:  Diagnosis Date   Anemia    Arthritis    Bell's palsy [id number]   Complication of anesthesia    Hard to wake up after a  colonoscopy before  around 2000   Dry eye    Family history of adverse reaction to anesthesia    mom hard to wake up and had lethargy after anesthesia   Glaucoma    Hepatitis 1986   secondary to varicella was in hospital for 2 days   Hypertension    Hypothyroidism    Malaria    as as child   Mumps    PONV (postoperative nausea and vomiting)    Pre-diabetes    Rosacea    Scoliosis    Sjogren's syndrome ([place])     PCP: Prince Solian, MD   Discharged Condition: good  Hospital Course:  Patient underwent the above stated procedure on 10/23/2020. Patient tolerated the procedure well and brought to the recovery room in good condition and subsequently to the floor. Patient had an uncomplicated hospital course and was stable for discharge.   Disposition: Discharge disposition: 01-Home or Self Care      with follow up in 2 weeks    Follow-up  Information     Netta Cedars, MD. Call in 2 week(s).   Specialty: Orthopedic Surgery Why: call [phone] for appt Contact information: [address] [place] [address] [id number]                 Dental Antibiotics:  In most cases prophylactic antibiotics for Dental procdeures after total joint surgery are not necessary.  Exceptions are as follows:  1. History of prior total joint infection  2. Severely immunocompromised (Organ Transplant, cancer chemotherapy, Rheumatoid biologic meds such as [place])  3. Poorly controlled diabetes (A1C &gt; 8.0, blood glucose over 200)  If you have one of these conditions, contact your surgeon for an antibiotic prescription, prior to your dental procedure.  Discharge Instructions     Call MD / Call 911   Complete by: As directed    If you experience chest pain or shortness of breath, CALL 911 and be transported to the hospital emergency room.  If you develope a fever above 101 F, pus (white drainage) or increased drainage or redness at the wound, or calf pain, call your surgeon's office.   Constipation Prevention   Complete by: As directed    Drink plenty of fluids.  Prune juice may be helpful.  You may use a stool softener, such as Colace (over the counter) 100 mg twice a day.  Use MiraLax (over the counter) for constipation as needed.  Diet - low sodium heart healthy   Complete by: As directed    Increase activity slowly as tolerated   Complete by: As directed    Post-operative opioid taper instructions:   Complete by: As directed    POST-OPERATIVE OPIOID TAPER INSTRUCTIONS: It is important to wean off of your opioid medication as soon as possible. If you do not need pain medication after your surgery it is ok to stop day one. Opioids include: Codeine, Hydrocodone(Norco, Vicodin), Oxycodone(Percocet, oxycontin) and hydromorphone amongst others.  Long term and even short term use of opiods can  cause: Increased pain response Dependence Constipation Depression Respiratory depression And more.  Withdrawal symptoms can include Flu like symptoms Nausea, vomiting And more Techniques to manage these symptoms Hydrate well Eat regular healthy meals Stay active Use relaxation techniques(deep breathing, meditating, yoga) Do Not substitute Alcohol to help with tapering If you have been on opioids for less than two weeks and do not have pain than it is ok to stop all together.  Plan to wean off of opioids This plan should start within one week post op of your joint replacement. Maintain the same interval or time between taking each dose and first decrease the dose.  Cut the total daily intake of opioids by one tablet each day Next start to increase the time between doses. The last dose that should be eliminated is the evening dose.          Allergies as of 10/24/2020       Reactions   Other Itching   Adhesive tape    Oyster Shell Hives   Shellfish Allergy Nausea And Vomiting   Oysters        Medication List     TAKE these medications    acetaminophen 650 MG CR tablet Commonly known as: TYLENOL Take 1,300 mg by mouth in the morning, at noon, and at bedtime.   BIOFREEZE EX Apply 1 drop topically daily as needed (back pain).   bismuth subsalicylate 99991111 MG chewable tablet Commonly known as: PEPTO BISMOL Chew 524 mg by mouth as needed for indigestion or diarrhea or loose stools.   Carboxymethylcellulose Sodium 1 % Gel Place 1 drop into both eyes at bedtime.   CENTRUM SILVER 50+WOMEN PO Take 1 tablet by mouth daily.   dorzolamide-timolol 22.3-6.8 MG/ML ophthalmic solution Commonly known as: COSOPT Place 1 drop into both eyes in the morning and at bedtime.   estradiol 1 MG tablet Commonly known as: ESTRACE Take 1 mg by mouth daily.   gabapentin 300 MG capsule Commonly known as: NEURONTIN Take 300 mg by mouth 3 (three) times daily.   hydroxypropyl  methylcellulose / hypromellose 2.5 % ophthalmic solution Commonly known as: ISOPTO TEARS / GONIOVISC Place 1 drop into both eyes daily.   levothyroxine 125 MCG tablet Commonly known as: SYNTHROID Take 125 mcg by mouth daily.   omeprazole 20 MG capsule Commonly known as: PRILOSEC Take 20 mg by mouth daily.   rosuvastatin 10 MG tablet Commonly known as: CRESTOR Take 10 mg by mouth at bedtime.   simethicone 125 MG chewable tablet Commonly known as: MYLICON Chew 0000000 mg by mouth every 6 (six) hours as needed for flatulence.   traMADol 50 MG tablet Commonly known as: ULTRAM Take 1 tablet (50 mg total) by mouth every 6 (six) hours as needed for moderate pain or severe pain. What changed:  when to take this reasons to take this   triamterene-hydrochlorothiazide 37.5-25 MG tablet Commonly known as: [employer]  Take 1 tablet by mouth daily.   Vitamin D 50 MCG (2000 UT) tablet Take 2,000 Units by mouth daily.          Signed: Augustin Schooling 10/24/2020, 9:33 AM  [facility] is now [facility]  [place] [address], [place], [place], [place] [address] Phone: [place]

## 2020-10-24 NOTE — Evaluation (Signed)
Occupational Therapy Evaluation Patient Details Name: Tracy Hunt MRN: [id number] DOB: 09-20-1945 Today's Date: 10/24/2020    History of Present Illness Patient s/p left rTSA   Clinical Impression   [name] is a 75 year old woman s/p shoulder replacement without functional use of left  non-dominant upper extremity secondary to effects of surgery, interscalene block and shoulder precautions. Therapist provided education and instruction to patient in regards to exercises, precautions, positioning, donning upper extremity clothing and bathing while maintaining shoulder precautions, ice and edema management and donning/doffing sling. Patient verbalized understanding off all education and demonstrated as needed. Patient needed assistance to donn shirt, underwear, and pants and provided with instruction on compensatory strategies to perform ADLs. Patient to follow up with MD for further therapy needs.      Follow Up Recommendations  No OT follow up;Follow surgeon's recommendation for DC plan and follow-up therapies    Equipment Recommendations  None recommended by OT    Recommendations for Other Services       Precautions / Restrictions Precautions Precautions: Shoulder Type of Shoulder Precautions: No AROM or PROM Shoulder Interventions: Shoulder sling/immobilizer;At all times;Off for dressing/bathing/exercises Precaution Booklet Issued:  (handouts) Required Braces or Orthoses: Sling Restrictions Weight Bearing Restrictions: Yes LUE Weight Bearing: Non weight bearing      Mobility Bed Mobility               General bed mobility comments: up in chair    Transfers Overall transfer level: Needs assistance Equipment used: None             General transfer comment: min guard for safety    Balance Overall balance assessment: No apparent balance deficits (not formally assessed)                                         ADL  either performed or assessed with clinical judgement   ADL Overall ADL's : Needs assistance/impaired Eating/Feeding: Set up   Grooming: Modified independent   Upper Body Bathing: Minimal assistance;Sitting;Adhering to UE precautions   Lower Body Bathing: Min guard;Sit to/from stand   Upper Body Dressing : Maximal assistance;Adhering to UE precautions;Sitting   Lower Body Dressing: Minimal assistance;Sit to/from stand Lower Body Dressing Details (indicate cue type and reason): to pull clothing up over left hip Toilet Transfer: Min guard   Toileting- Clothing Manipulation and Hygiene: Minimal assistance;Sit to/from stand       Functional mobility during ADLs: Min guard       Vision Patient Visual Report: No change from baseline       Perception     Praxis      Pertinent Vitals/Pain Pain Assessment: No/denies pain     Hand Dominance Right   Extremity/Trunk Assessment Upper Extremity Assessment Upper Extremity Assessment: RUE deficits/detail;LUE deficits/detail RUE Deficits / Details: WFL ROM and strength RUE Sensation: WNL RUE Coordination: WNL LUE Deficits / Details: Impaired AROM secondary to effects of block   Lower Extremity Assessment Lower Extremity Assessment: Overall WFL for tasks assessed   Cervical / Trunk Assessment Cervical / Trunk Assessment: Normal   Communication Communication Communication: No difficulties   Cognition Arousal/Alertness: Awake/alert Behavior During Therapy: WFL for tasks assessed/performed Overall Cognitive Status: Within Functional Limits for tasks assessed  General Comments       Exercises     Shoulder Instructions      Home Living Family/patient expects to be discharged to:: Private residence Living Arrangements: Children Available Help at Discharge: Family;Available PRN/intermittently               Bathroom Shower/Tub: Occupational psychologist:  Standard     Home Equipment: Shower seat - built in          Prior Functioning/Environment Level of Independence: Independent                 OT Problem List: Decreased strength;Decreased range of motion;Impaired UE functional use;Pain;Decreased knowledge of precautions      OT Treatment/Interventions:      OT Goals(Current goals can be found in the care plan section) Acute Rehab OT Goals OT Goal Formulation: All assessment and education complete, DC therapy  OT Frequency:     Barriers to D/C:            Co-evaluation              AM-PAC OT "6 Clicks" Daily Activity     Outcome Measure Help from another person eating meals?: A Little Help from another person taking care of personal grooming?: None Help from another person toileting, which includes using toliet, bedpan, or urinal?: A Little Help from another person bathing (including washing, rinsing, drying)?: A Little Help from another person to put on and taking off regular upper body clothing?: A Lot Help from another person to put on and taking off regular lower body clothing?: A Little 6 Click Score: 18   End of Session Nurse Communication: Mobility status  Activity Tolerance: Patient tolerated treatment well Patient left: in chair;with call bell/phone within reach  OT Visit Diagnosis: Pain                Time: [id number] OT Time Calculation (min): 25 min Charges:  OT General Charges $OT Visit: 1 Visit OT Evaluation $OT Eval Low Complexity: 1 Low OT Treatments $Self Care/Home Management : 8-22 mins  Derl Barrow, OTR/L [place]  Office [phone] Pager: [phone]   Lenward Chancellor 10/24/2020, 8:50 AM

## 2020-10-24 NOTE — Hospital Course (Signed)
Orthopedics Progress Note  Subjective: Stable overnight. Comfortable this AM.   Objective:  Vitals:   10/24/20 0149 10/24/20 0527  BP: 121/67 125/80  Pulse: 87 89  Resp: 16 16  Temp: 98.2 F (36.8 C) 98.4 F (36.9 C)  SpO2:  96%    General: Awake and alert  Musculoskeletal: Left shoulder dressing changed, incision looks good, no drainage Neurovascularly intact  Lab Results  Component Value Date   WBC 4.5 10/12/2020   HGB 12.7 10/24/2020   HCT 37.2 10/24/2020   MCV 96.7 10/12/2020   PLT 152 10/12/2020       Component Value Date/Time   NA 134 (L) 10/24/2020 0352   K 2.9 (L) 10/24/2020 0352   CL 101 10/24/2020 0352   CO2 25 10/24/2020 0352   GLUCOSE 181 (H) 10/24/2020 0352   BUN 15 10/24/2020 0352   CREATININE 0.76 10/24/2020 0352   CALCIUM 8.9 10/24/2020 0352   GFRNONAA >60 10/24/2020 0352    No results found for: INR, PROTIME  Assessment/Plan: POD #1 s/p Procedure(s): REVERSE SHOULDER ARTHROPLASTY Home this morning. OT as instructed.  Follow up in the office in two weeks  Doran Heater. Veverly Fells, MD 10/24/2020 9:26 AM

## 2020-10-24 NOTE — Plan of Care (Signed)
  Problem: Education: Goal: Knowledge of the prescribed therapeutic regimen will improve Outcome: Progressing   Problem: Activity: Goal: Ability to tolerate increased activity will improve Outcome: Progressing   Problem: Pain Management: Goal: Pain level will decrease with appropriate interventions Outcome: Progressing   

## 2020-10-24 NOTE — Plan of Care (Signed)
Patient discharged.

## 2020-10-26 ENCOUNTER — Encounter (HOSPITAL_COMMUNITY): Payer: Self-pay | Admitting: Orthopedic Surgery

## 2020-10-29 DIAGNOSIS — E785 Hyperlipidemia, unspecified: Secondary | ICD-10-CM | POA: Diagnosis not present

## 2020-11-03 DIAGNOSIS — Z4789 Encounter for other orthopedic aftercare: Secondary | ICD-10-CM | POA: Diagnosis not present

## 2020-11-03 DIAGNOSIS — Z9889 Other specified postprocedural states: Secondary | ICD-10-CM | POA: Diagnosis not present

## 2020-11-24 DIAGNOSIS — K006 Disturbances in tooth eruption: Secondary | ICD-10-CM | POA: Diagnosis not present

## 2020-11-24 DIAGNOSIS — R69 Illness, unspecified: Secondary | ICD-10-CM | POA: Diagnosis not present

## 2020-11-30 DIAGNOSIS — H401131 Primary open-angle glaucoma, bilateral, mild stage: Secondary | ICD-10-CM | POA: Diagnosis not present

## 2020-11-30 DIAGNOSIS — H04123 Dry eye syndrome of bilateral lacrimal glands: Secondary | ICD-10-CM | POA: Diagnosis not present

## 2020-12-01 DIAGNOSIS — Z4789 Encounter for other orthopedic aftercare: Secondary | ICD-10-CM | POA: Diagnosis not present

## 2021-01-04 DIAGNOSIS — K5900 Constipation, unspecified: Secondary | ICD-10-CM | POA: Diagnosis not present

## 2021-01-12 DIAGNOSIS — Z4789 Encounter for other orthopedic aftercare: Secondary | ICD-10-CM | POA: Diagnosis not present

## 2021-01-13 DIAGNOSIS — M5127 Other intervertebral disc displacement, lumbosacral region: Secondary | ICD-10-CM | POA: Diagnosis not present

## 2021-01-13 DIAGNOSIS — M4807 Spinal stenosis, lumbosacral region: Secondary | ICD-10-CM | POA: Diagnosis not present

## 2021-01-13 DIAGNOSIS — M5126 Other intervertebral disc displacement, lumbar region: Secondary | ICD-10-CM | POA: Diagnosis not present

## 2021-01-13 DIAGNOSIS — M47816 Spondylosis without myelopathy or radiculopathy, lumbar region: Secondary | ICD-10-CM | POA: Diagnosis not present

## 2021-01-13 DIAGNOSIS — M48061 Spinal stenosis, lumbar region without neurogenic claudication: Secondary | ICD-10-CM | POA: Diagnosis not present

## 2021-01-13 DIAGNOSIS — M47817 Spondylosis without myelopathy or radiculopathy, lumbosacral region: Secondary | ICD-10-CM | POA: Diagnosis not present

## 2021-01-26 DIAGNOSIS — M5416 Radiculopathy, lumbar region: Secondary | ICD-10-CM | POA: Diagnosis not present

## 2021-01-26 DIAGNOSIS — M47816 Spondylosis without myelopathy or radiculopathy, lumbar region: Secondary | ICD-10-CM | POA: Diagnosis not present

## 2021-02-02 ENCOUNTER — Other Ambulatory Visit: Payer: Self-pay | Admitting: Internal Medicine

## 2021-02-02 DIAGNOSIS — Z1231 Encounter for screening mammogram for malignant neoplasm of breast: Secondary | ICD-10-CM

## 2021-02-25 DIAGNOSIS — M5416 Radiculopathy, lumbar region: Secondary | ICD-10-CM | POA: Diagnosis not present

## 2021-03-08 DIAGNOSIS — H401131 Primary open-angle glaucoma, bilateral, mild stage: Secondary | ICD-10-CM | POA: Diagnosis not present

## 2021-03-17 ENCOUNTER — Ambulatory Visit
Admission: RE | Admit: 2021-03-17 | Discharge: 2021-03-17 | Disposition: A | Discharge status: Home / Self Care | Payer: Medicare HMO | Source: Ambulatory Visit | Attending: Internal Medicine | Admitting: Internal Medicine

## 2021-03-17 DIAGNOSIS — Z1231 Encounter for screening mammogram for malignant neoplasm of breast: Secondary | ICD-10-CM

## 2021-03-17 DIAGNOSIS — M5416 Radiculopathy, lumbar region: Secondary | ICD-10-CM | POA: Diagnosis not present

## 2021-03-31 DIAGNOSIS — M359 Systemic involvement of connective tissue, unspecified: Secondary | ICD-10-CM | POA: Diagnosis not present

## 2021-03-31 DIAGNOSIS — E669 Obesity, unspecified: Secondary | ICD-10-CM | POA: Diagnosis not present

## 2021-03-31 DIAGNOSIS — R251 Tremor, unspecified: Secondary | ICD-10-CM | POA: Diagnosis not present

## 2021-03-31 DIAGNOSIS — Z6833 Body mass index (BMI) 33.0-33.9, adult: Secondary | ICD-10-CM | POA: Diagnosis not present

## 2021-03-31 DIAGNOSIS — M150 Primary generalized (osteo)arthritis: Secondary | ICD-10-CM | POA: Diagnosis not present

## 2021-03-31 DIAGNOSIS — G5793 Unspecified mononeuropathy of bilateral lower limbs: Secondary | ICD-10-CM | POA: Diagnosis not present

## 2021-04-05 DIAGNOSIS — H04211 Epiphora due to excess lacrimation, right lacrimal gland: Secondary | ICD-10-CM | POA: Diagnosis not present

## 2021-04-05 DIAGNOSIS — H04122 Dry eye syndrome of left lacrimal gland: Secondary | ICD-10-CM | POA: Diagnosis not present

## 2021-04-05 DIAGNOSIS — H04121 Dry eye syndrome of right lacrimal gland: Secondary | ICD-10-CM | POA: Diagnosis not present

## 2021-04-05 DIAGNOSIS — H04123 Dry eye syndrome of bilateral lacrimal glands: Secondary | ICD-10-CM | POA: Diagnosis not present

## 2021-04-05 DIAGNOSIS — H04212 Epiphora due to excess lacrimation, left lacrimal gland: Secondary | ICD-10-CM | POA: Diagnosis not present

## 2021-04-05 DIAGNOSIS — H04213 Epiphora due to excess lacrimation, bilateral lacrimal glands: Secondary | ICD-10-CM | POA: Diagnosis not present

## 2021-04-14 DIAGNOSIS — R7989 Other specified abnormal findings of blood chemistry: Secondary | ICD-10-CM | POA: Diagnosis not present

## 2021-04-14 DIAGNOSIS — E538 Deficiency of other specified B group vitamins: Secondary | ICD-10-CM | POA: Diagnosis not present

## 2021-04-14 DIAGNOSIS — M359 Systemic involvement of connective tissue, unspecified: Secondary | ICD-10-CM | POA: Diagnosis not present

## 2021-04-15 DIAGNOSIS — Z4789 Encounter for other orthopedic aftercare: Secondary | ICD-10-CM | POA: Diagnosis not present

## 2021-04-18 DIAGNOSIS — E039 Hypothyroidism, unspecified: Secondary | ICD-10-CM | POA: Diagnosis not present

## 2021-04-18 DIAGNOSIS — M1990 Unspecified osteoarthritis, unspecified site: Secondary | ICD-10-CM | POA: Diagnosis not present

## 2021-04-18 DIAGNOSIS — E785 Hyperlipidemia, unspecified: Secondary | ICD-10-CM | POA: Diagnosis not present

## 2021-04-19 DIAGNOSIS — Z79890 Hormone replacement therapy (postmenopausal): Secondary | ICD-10-CM | POA: Diagnosis not present

## 2021-04-19 DIAGNOSIS — M1990 Unspecified osteoarthritis, unspecified site: Secondary | ICD-10-CM | POA: Diagnosis not present

## 2021-04-19 DIAGNOSIS — M3505 Sjogren syndrome with inflammatory arthritis: Secondary | ICD-10-CM | POA: Diagnosis not present

## 2021-04-19 DIAGNOSIS — D72819 Decreased white blood cell count, unspecified: Secondary | ICD-10-CM | POA: Diagnosis not present

## 2021-04-19 DIAGNOSIS — E785 Hyperlipidemia, unspecified: Secondary | ICD-10-CM | POA: Diagnosis not present

## 2021-04-19 DIAGNOSIS — E1140 Type 2 diabetes mellitus with diabetic neuropathy, unspecified: Secondary | ICD-10-CM | POA: Diagnosis not present

## 2021-04-19 DIAGNOSIS — E039 Hypothyroidism, unspecified: Secondary | ICD-10-CM | POA: Diagnosis not present

## 2021-04-19 DIAGNOSIS — R209 Unspecified disturbances of skin sensation: Secondary | ICD-10-CM | POA: Diagnosis not present

## 2021-04-19 DIAGNOSIS — R109 Unspecified abdominal pain: Secondary | ICD-10-CM | POA: Diagnosis not present

## 2021-05-24 DIAGNOSIS — H04213 Epiphora due to excess lacrimation, bilateral lacrimal glands: Secondary | ICD-10-CM | POA: Diagnosis not present

## 2021-05-24 DIAGNOSIS — H04121 Dry eye syndrome of right lacrimal gland: Secondary | ICD-10-CM | POA: Diagnosis not present

## 2021-05-26 DIAGNOSIS — D692 Other nonthrombocytopenic purpura: Secondary | ICD-10-CM | POA: Diagnosis not present

## 2021-05-26 DIAGNOSIS — L718 Other rosacea: Secondary | ICD-10-CM | POA: Diagnosis not present

## 2021-05-26 DIAGNOSIS — Z85828 Personal history of other malignant neoplasm of skin: Secondary | ICD-10-CM | POA: Diagnosis not present

## 2021-05-26 DIAGNOSIS — R208 Other disturbances of skin sensation: Secondary | ICD-10-CM | POA: Diagnosis not present

## 2021-05-26 DIAGNOSIS — D1801 Hemangioma of skin and subcutaneous tissue: Secondary | ICD-10-CM | POA: Diagnosis not present

## 2021-05-26 DIAGNOSIS — L821 Other seborrheic keratosis: Secondary | ICD-10-CM | POA: Diagnosis not present

## 2021-05-26 DIAGNOSIS — L308 Other specified dermatitis: Secondary | ICD-10-CM | POA: Diagnosis not present

## 2021-06-04 ENCOUNTER — Encounter: Payer: Self-pay | Admitting: Neurology

## 2021-06-11 ENCOUNTER — Ambulatory Visit (INDEPENDENT_AMBULATORY_CARE_PROVIDER_SITE_OTHER): Payer: Medicare HMO | Admitting: Neurology

## 2021-06-11 ENCOUNTER — Other Ambulatory Visit (INDEPENDENT_AMBULATORY_CARE_PROVIDER_SITE_OTHER): Payer: Medicare HMO

## 2021-06-11 ENCOUNTER — Other Ambulatory Visit: Payer: Self-pay

## 2021-06-11 ENCOUNTER — Encounter: Payer: Self-pay | Admitting: Neurology

## 2021-06-11 VITALS — BP 138/80 | HR 78 | Ht 63.0 in | Wt 194.0 lb

## 2021-06-11 DIAGNOSIS — M3506 Sjogren syndrome with peripheral nervous system involvement: Secondary | ICD-10-CM

## 2021-06-11 DIAGNOSIS — G609 Hereditary and idiopathic neuropathy, unspecified: Secondary | ICD-10-CM

## 2021-06-11 DIAGNOSIS — G63 Polyneuropathy in diseases classified elsewhere: Secondary | ICD-10-CM | POA: Diagnosis not present

## 2021-06-11 MED ORDER — GABAPENTIN 300 MG PO CAPS: ORAL_CAPSULE | ORAL | 1 refills | Status: DC

## 2021-06-11 NOTE — Progress Notes (Signed)
?[facility] ?Neurology Division ?Clinic Note - Initial Visit ? ? ?Date: 06/11/21 ? ?[facility] Tracy Hunt ?MRN: [id number] ?DOB: 1945/10/04 ? ? ?Dear Dr. Dagmar Hait: ? ? ?Thank you for your kind referral of Tracy Hunt for consultation of bilateral feet pain. Although her history is well known to you, please allow Tracy Hunt to reiterate it for the purpose of our medical record. The patient was accompanied to the clinic by self. ? ? ?History of Present Illness: ?Tracy Hunt is a 76 y.o. right-handed female with hypertension, hypothyroidism, Sjogren's syndrome (followed by Dr. 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 For the past 2-3 years, she has achy, burning pain, and numbness involving the feet.  Symptoms are constant, without any identifiable exacerbating or alleviating factors.  She endorses imbalance, and uses a cane only as needed on uneven ground.  No falls. She has chronic low back pain. She takes gabapentin '300mg'$  three times daily which helps reduce her pain, but does not alleviate it. Recent B12 levels and HbA1c were normal. ? ?She lives with daughter in 4 level home.  Nonsmoker, does not drink alcohol.  ? ? ?Past Medical History:  ?Diagnosis Date  ? Anemia   ? Arthritis   ? Bell's palsy 1988  ? Complication of anesthesia   ? Hard to wake up after a  colonoscopy before  around 2000  ? Dry eye   ? Family history of adverse reaction to anesthesia   ? mom hard to wake up and had lethargy after anesthesia  ? Glaucoma   ? Hepatitis 1986  ? secondary to varicella was in hospital for 2 days  ? Hypertension   ? Hypothyroidism   ? Malaria   ? as as child  ? Mumps   ? PONV (postoperative nausea and vomiting)   ? Pre-diabetes   ? Rosacea   ? Scoliosis   ? Sjogren's syndrome ([place])   ? ? ?Past Surgical History:  ?Procedure Laterality Date  ? ABDOMINAL HYSTERECTOMY  1995  ? APPENDECTOMY  1995  ? CHOLECYSTECTOMY    ?  EYE SURGERY Bilateral   ? cataract  ? JOINT REPLACEMENT  2012  ? Left knee  ? JOINT REPLACEMENT    ? right knee 2017  ? REVERSE SHOULDER ARTHROPLASTY Left 10/23/2020  ? Procedure: REVERSE SHOULDER ARTHROPLASTY;  Surgeon: Netta Cedars, MD;  Location: [facility];  Service: Orthopedics;  Laterality: Left;  with ISB  ? steroid injections    ? L3,L4, L5  ? ? ? ?Medications:  ?Outpatient Encounter Medications as of 06/11/2021  ?Medication Sig  ? acetaminophen (TYLENOL) 650 MG CR tablet Take 1,300 mg by mouth in the morning, at noon, and at bedtime.  ? bismuth subsalicylate (PEPTO BISMOL) 262 MG chewable tablet Chew 524 mg by mouth as needed for indigestion or diarrhea or loose stools.  ? Carboxymethylcellulose Sodium 1 % GEL Place 1 drop into both eyes at bedtime.  ? Cholecalciferol (VITAMIN D) 50 MCG (2000 UT) tablet Take 2,000 Units by mouth daily.  ? dorzolamide-timolol (COSOPT) 22.3-6.8 MG/ML ophthalmic solution Place 1 drop into both eyes in the morning and at bedtime.  ? estradiol (ESTRACE) 1 MG tablet Take 1 mg by mouth daily.  ? gabapentin (NEURONTIN) 300 MG capsule Take 300 mg by mouth 3 (three) times daily.  ? hydroxypropyl methylcellulose / hypromellose (ISOPTO TEARS / GONIOVISC) 2.5 % ophthalmic solution Place 1 drop into both eyes  daily.  ? levothyroxine (SYNTHROID) 125 MCG tablet Take 125 mcg by mouth daily.  ? Menthol, Topical Analgesic, (BIOFREEZE EX) Apply 1 drop topically daily as needed (back pain).  ? Multiple Vitamins-Minerals (CENTRUM SILVER 50+WOMEN PO) Take 1 tablet by mouth daily.  ? omeprazole (PRILOSEC) 20 MG capsule Take 20 mg by mouth daily.  ? rosuvastatin (CRESTOR) 10 MG tablet Take 10 mg by mouth at bedtime.  ? simethicone (MYLICON) 992 MG chewable tablet Chew 125 mg by mouth every 6 (six) hours as needed for flatulence.  ? traMADol (ULTRAM) 50 MG tablet Take 1 tablet (50 mg total) by mouth every 6 (six) hours as needed for moderate pain or severe pain.  ? triamterene-hydrochlorothiazide  (MAXZIDE-25) 37.5-25 MG tablet Take 1 tablet by mouth daily.  ? ?No facility-administered encounter medications on file as of 06/11/2021.  ? ? ?Allergies:  ?Allergies  ?Allergen Reactions  ? Other Itching  ?  Adhesive tape   ? Oyster [employer]  ? Shellfish Allergy Nausea And Vomiting  ?  Oysters  ? ? ?Family History: ?Family History  ?Problem Relation Age of Onset  ? Colon cancer Mother   ? Lung cancer Mother   ? Hypertension Mother   ? Diabetes Father   ? Breast cancer Paternal Grandmother   ? ? ?Social History: ?Social History  ? ?Tobacco Use  ? Smoking status: Never  ? Smokeless tobacco: Never  ?Vaping Use  ? Vaping Use: Never used  ?Substance Use Topics  ? Alcohol use: Not Currently  ? Drug use: Never  ? ?Social History  ? ?Social History Narrative  ? Right Handed   ? Lives in a 4 story home with a basement. Only uses the first story.   ? ? ?Vital Signs:  ?BP 138/80   Pulse 78   Ht '5\' 3"'$  (1.6 m)   Wt 194 lb (88 kg)   SpO2 98%   BMI 34.37 kg/m?  ? ? ?Neurological Exam: ?MENTAL STATUS including orientation to time, place, person, recent and remote memory, attention span and concentration, language, and fund of knowledge is normal.  Speech is not dysarthric. ? ?CRANIAL NERVES: ?II:  No visual field defects.     ?III-IV-VI: Pupils equal round and reactive to light.  Normal conjugate, extra-ocular eye movements in all directions of gaze.  No nystagmus.  No ptosis.   ?V:  Normal facial sensation.    ?VII:  Right facial weakness from Bell's palsy.  Facial muscles are 4/5 on the right as compared to the left which is 5/5.  Asymmetric smile.    ?VIII:  Normal hearing and vestibular function.   ?IX-X:  Normal palatal movement.   ?XI:  Normal shoulder shrug and head rotation.   ?XII:  Normal tongue strength and range of motion, no deviation or fasciculation. ? ?MOTOR:  No atrophy, fasciculations or abnormal movements.  No pronator drift.  ? ?Upper Extremity:  Right  Left  ?Deltoid  5/5   5/5   ?Biceps  5/5   5/5    ?Triceps  5/5   5/5   ?Wrist extensors  5/5   5/5   ?Wrist flexors  5/5   5/5   ?Finger extensors  5/5   5/5   ?Finger flexors  5/5   5/5   ?Dorsal interossei  5/5   5/5   ?Abductor pollicis  5/5   5/5   ?Tone (Ashworth scale)  0  0  ? ?Lower Extremity:  Right  Left  ?Hip  flexors  5/5   5/5   ?Knee flexors  5/5   5/5   ?Knee extensors  5/5   5/5   ?Dorsiflexors  5/5   5/5   ?Plantarflexors  5/5   5/5   ?Toe extensors  5/5   5/5   ?Toe flexors  5/5   5/5   ?Tone (Ashworth scale)  0  0  ? ?MSRs:  ?Right        Left                  ?brachioradialis 2+  2+  ?biceps 2+  2+  ?triceps 2+  2+  ?patellar 2+  2+  ?ankle jerk 1+  1+  ?Hoffman no  no  ?plantar response down  down  ? ?SENSORY:  Vibration, pin prick, and temperature is reduced a the feet.  Rhomberg testing is positive. ? ?COORDINATION/GAIT: Normal finger-to- nose-finger.  Intact rapid alternating movements bilaterally.  Gait is mildly wide-based, unassisted.  Stressed gait intact.  Unsteady with tandem gait ? ? 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  Risk factors include Sjogren's disease, age. 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 I had extensive discussion with the patient regarding the pathogenesis, etiology, management, and natural course of neuropathy. Neuropathy tends to be slowly progressive, especially if a treatable etiology is not identified.  I would like to test for treatable causes of neuropathy. I discussed that in the vast majority of cases, despite checking for reversible causes, we are unable to find the underlying etiology and management is symptomatic.   ? ? ?PLAN/RECOMMENDATIONS:  ?NCS/EMG of the legs ?Check folate, copper, SPEP with IFE ?Increase gabapentin to '600mg'$  BID ?Start out-patient PT for balance training ?Patient educated on daily foot inspection, fall prevention, and safety precautions around the home. ? ? ?Return to clinic in  6 months. ? ? ?Thank you for allowing me to participate in patient's care.  If I can answer any additional questions, I would be pleased to do so.   ? ?Sincerely, ? ? ? ?Len Kluver K. Posey Pronto, DO ? ?

## 2021-06-11 NOTE — Patient Instructions (Addendum)
Start gabapentin '600mg'$  twice daily ? ?Check labs ? ?Start out-patient physical therapy for balance therapy ? ?Nerve testing of the legs ? ?Return to clinic in 6 months ? ?ELECTROMYOGRAM AND NERVE CONDUCTION STUDIES (EMG/NCS) INSTRUCTIONS ? ?How to Prepare ?The neurologist conducting the EMG will need to know if you have certain medical conditions. Tell the neurologist and other EMG lab personnel if you: ?Have a pacemaker or any other electrical medical device ?Take blood-thinning medications ?Have hemophilia, a blood-clotting disorder that causes prolonged bleeding ?Bathing ?Take a shower or bath shortly before your exam in order to remove oils from your skin. Don?t apply lotions or creams before the exam.  ?What to Expect ?You?ll likely be asked to change into a hospital gown for the procedure and lie down on an examination table. The following explanations can help you understand what will happen during the exam.  ?Electrodes. The neurologist or a technician places surface electrodes at various locations on your skin depending on where you?re experiencing symptoms. Or the neurologist may insert needle electrodes at different sites depending on your symptoms.  ?Sensations. The electrodes will at times transmit a tiny electrical current that you may feel as a twinge or spasm. The needle electrode may cause discomfort or pain that usually ends shortly after the needle is removed. ?If you are concerned about discomfort or pain, you may want to talk to the neurologist about taking a short break during the exam.  ?Instructions. During the needle EMG, the neurologist will assess whether there is any spontaneous electrical activity when the muscle is at rest - activity that isn?t present in healthy muscle tissue - and the degree of activity when you slightly contract the muscle.  ?He or she will give you instructions on resting and contracting a muscle at appropriate times. Depending on what muscles and nerves the  neurologist is examining, he or she may ask you to change positions during the exam.  ?After your EMG ?You may experience some temporary, minor bruising where the needle electrode was inserted into your muscle. This bruising should fade within several days. If it persists, contact your primary care doctor.  ? ? ? ?

## 2021-06-13 ENCOUNTER — Encounter: Payer: Self-pay | Admitting: Neurology

## 2021-06-14 ENCOUNTER — Telehealth: Payer: Self-pay | Admitting: Neurology

## 2021-06-14 DIAGNOSIS — M79672 Pain in left foot: Secondary | ICD-10-CM | POA: Diagnosis not present

## 2021-06-14 DIAGNOSIS — M79671 Pain in right foot: Secondary | ICD-10-CM | POA: Diagnosis not present

## 2021-06-14 NOTE — Telephone Encounter (Signed)
Patient called to follow up about her MyChart message: ? ?Dr Posey Pronto, You increased my dose and said to take two 300 mg pills in the morning and two pills at night.  I did not tolerate the morning well, I fell dazed, and  not functional,  afraid to fall until around 3 pm. The night I slept well, but going to the bathroom i was afraid to fall, even using my cane. ?How else you suggest  I may take the dose you prescribed? ?Thank you for your attention ?Tracy Hunt ?

## 2021-06-15 ENCOUNTER — Other Ambulatory Visit (HOSPITAL_COMMUNITY): Payer: Self-pay | Admitting: Orthopaedic Surgery

## 2021-06-15 DIAGNOSIS — R52 Pain, unspecified: Secondary | ICD-10-CM

## 2021-06-15 DIAGNOSIS — R2689 Other abnormalities of gait and mobility: Secondary | ICD-10-CM | POA: Diagnosis not present

## 2021-06-15 MED ORDER — GABAPENTIN 100 MG PO CAPS: ORAL_CAPSULE | ORAL | 5 refills | Status: DC

## 2021-06-15 NOTE — Telephone Encounter (Signed)
Replied to patient via MyChart. 

## 2021-06-16 LAB — IMMUNOFIXATION ELECTROPHORESIS
IgG (Immunoglobin G), Serum: 1816 mg/dL — ABNORMAL HIGH (ref 600–1540)
IgM, Serum: 108 mg/dL (ref 50–300)
Immunofix Electr Int: NOT DETECTED
Immunoglobulin A: 235 mg/dL (ref 70–320)

## 2021-06-16 LAB — PROTEIN ELECTROPHORESIS, SERUM
Albumin ELP: 3.9 g/dL (ref 3.8–4.8)
Alpha 1: 0.3 g/dL (ref 0.2–0.3)
Alpha 2: 0.7 g/dL (ref 0.5–0.9)
Beta 2: 0.4 g/dL (ref 0.2–0.5)
Beta Globulin: 0.4 g/dL (ref 0.4–0.6)
Gamma Globulin: 1.6 g/dL (ref 0.8–1.7)
Total Protein: 7.4 g/dL (ref 6.1–8.1)

## 2021-06-16 LAB — COPPER, SERUM: Copper: 83 ug/dL (ref 70–175)

## 2021-06-16 LAB — FOLATE: Folate: >24 ng/mL

## 2021-06-17 ENCOUNTER — Ambulatory Visit (HOSPITAL_COMMUNITY)
Admission: RE | Admit: 2021-06-17 | Discharge: 2021-06-17 | Disposition: A | Discharge status: Home / Self Care | Payer: Medicare HMO | Source: Ambulatory Visit | Attending: Orthopaedic Surgery | Admitting: Orthopaedic Surgery

## 2021-06-17 DIAGNOSIS — R52 Pain, unspecified: Secondary | ICD-10-CM | POA: Diagnosis not present

## 2021-06-17 NOTE — Progress Notes (Signed)
ABI has been completed.  ? ?Preliminary results in CV Proc.  ? ?Gilda Abboud Will Schier ?06/17/2021 1:15 PM    ?

## 2021-06-18 ENCOUNTER — Ambulatory Visit: Payer: Medicare HMO | Admitting: Neurology

## 2021-06-22 DIAGNOSIS — R2689 Other abnormalities of gait and mobility: Secondary | ICD-10-CM | POA: Insufficient documentation

## 2021-06-23 DIAGNOSIS — R2689 Other abnormalities of gait and mobility: Secondary | ICD-10-CM | POA: Diagnosis not present

## 2021-06-28 DIAGNOSIS — R2689 Other abnormalities of gait and mobility: Secondary | ICD-10-CM | POA: Diagnosis not present

## 2021-07-05 DIAGNOSIS — R2689 Other abnormalities of gait and mobility: Secondary | ICD-10-CM | POA: Diagnosis not present

## 2021-07-06 ENCOUNTER — Ambulatory Visit: Payer: Medicare HMO | Admitting: Neurology

## 2021-07-08 ENCOUNTER — Telehealth: Payer: Self-pay | Admitting: Neurology

## 2021-07-08 DIAGNOSIS — R2689 Other abnormalities of gait and mobility: Secondary | ICD-10-CM | POA: Diagnosis not present

## 2021-07-08 NOTE — Telephone Encounter (Signed)
Pt called in stating her feet have begun to swell up and her toes will get blue. She would like to find out what she should do? ?

## 2021-07-09 NOTE — Telephone Encounter (Signed)
Patient advised to follow up with PCP, to get an appt to be seen for an evaluation an assessment. Patient thanked me for calling.  ?

## 2021-08-11 DIAGNOSIS — K006 Disturbances in tooth eruption: Secondary | ICD-10-CM | POA: Diagnosis not present

## 2021-09-14 DIAGNOSIS — H401131 Primary open-angle glaucoma, bilateral, mild stage: Secondary | ICD-10-CM | POA: Diagnosis not present

## 2021-09-14 DIAGNOSIS — H04123 Dry eye syndrome of bilateral lacrimal glands: Secondary | ICD-10-CM | POA: Diagnosis not present

## 2021-10-12 DIAGNOSIS — Z6833 Body mass index (BMI) 33.0-33.9, adult: Secondary | ICD-10-CM | POA: Diagnosis not present

## 2021-10-12 DIAGNOSIS — E669 Obesity, unspecified: Secondary | ICD-10-CM | POA: Diagnosis not present

## 2021-10-12 DIAGNOSIS — R251 Tremor, unspecified: Secondary | ICD-10-CM | POA: Diagnosis not present

## 2021-10-12 DIAGNOSIS — M79645 Pain in left finger(s): Secondary | ICD-10-CM | POA: Diagnosis not present

## 2021-10-12 DIAGNOSIS — G5793 Unspecified mononeuropathy of bilateral lower limbs: Secondary | ICD-10-CM | POA: Diagnosis not present

## 2021-10-12 DIAGNOSIS — M1991 Primary osteoarthritis, unspecified site: Secondary | ICD-10-CM | POA: Diagnosis not present

## 2021-10-12 DIAGNOSIS — M359 Systemic involvement of connective tissue, unspecified: Secondary | ICD-10-CM | POA: Diagnosis not present

## 2021-10-26 DIAGNOSIS — M1812 Unilateral primary osteoarthritis of first carpometacarpal joint, left hand: Secondary | ICD-10-CM | POA: Diagnosis not present

## 2021-10-26 DIAGNOSIS — M65312 Trigger thumb, left thumb: Secondary | ICD-10-CM | POA: Diagnosis not present

## 2021-10-26 DIAGNOSIS — Z96612 Presence of left artificial shoulder joint: Secondary | ICD-10-CM | POA: Diagnosis not present

## 2021-10-26 DIAGNOSIS — M79642 Pain in left hand: Secondary | ICD-10-CM | POA: Diagnosis not present

## 2021-10-27 ENCOUNTER — Other Ambulatory Visit: Payer: Self-pay | Admitting: Neurology

## 2021-10-27 DIAGNOSIS — R7989 Other specified abnormal findings of blood chemistry: Secondary | ICD-10-CM | POA: Diagnosis not present

## 2021-10-27 DIAGNOSIS — E785 Hyperlipidemia, unspecified: Secondary | ICD-10-CM | POA: Diagnosis not present

## 2021-10-27 DIAGNOSIS — E039 Hypothyroidism, unspecified: Secondary | ICD-10-CM | POA: Diagnosis not present

## 2021-10-27 DIAGNOSIS — E1140 Type 2 diabetes mellitus with diabetic neuropathy, unspecified: Secondary | ICD-10-CM | POA: Diagnosis not present

## 2021-11-02 ENCOUNTER — Encounter: Payer: Medicare HMO | Admitting: Neurology

## 2021-11-03 DIAGNOSIS — M1990 Unspecified osteoarthritis, unspecified site: Secondary | ICD-10-CM | POA: Diagnosis not present

## 2021-11-03 DIAGNOSIS — E785 Hyperlipidemia, unspecified: Secondary | ICD-10-CM | POA: Diagnosis not present

## 2021-11-03 DIAGNOSIS — Z0000 Encounter for general adult medical examination without abnormal findings: Secondary | ICD-10-CM | POA: Diagnosis not present

## 2021-11-03 DIAGNOSIS — Z79890 Hormone replacement therapy (postmenopausal): Secondary | ICD-10-CM | POA: Diagnosis not present

## 2021-11-03 DIAGNOSIS — D72819 Decreased white blood cell count, unspecified: Secondary | ICD-10-CM | POA: Diagnosis not present

## 2021-11-03 DIAGNOSIS — R209 Unspecified disturbances of skin sensation: Secondary | ICD-10-CM | POA: Diagnosis not present

## 2021-11-03 DIAGNOSIS — R82998 Other abnormal findings in urine: Secondary | ICD-10-CM | POA: Diagnosis not present

## 2021-11-03 DIAGNOSIS — E039 Hypothyroidism, unspecified: Secondary | ICD-10-CM | POA: Diagnosis not present

## 2021-11-03 DIAGNOSIS — E1140 Type 2 diabetes mellitus with diabetic neuropathy, unspecified: Secondary | ICD-10-CM | POA: Diagnosis not present

## 2021-11-03 DIAGNOSIS — M3505 Sjogren syndrome with inflammatory arthritis: Secondary | ICD-10-CM | POA: Diagnosis not present

## 2021-11-08 DIAGNOSIS — M1811 Unilateral primary osteoarthritis of first carpometacarpal joint, right hand: Secondary | ICD-10-CM | POA: Diagnosis not present

## 2021-11-08 DIAGNOSIS — M65312 Trigger thumb, left thumb: Secondary | ICD-10-CM | POA: Diagnosis not present

## 2021-11-08 DIAGNOSIS — M180 Bilateral primary osteoarthritis of first carpometacarpal joints: Secondary | ICD-10-CM | POA: Diagnosis not present

## 2021-11-15 ENCOUNTER — Ambulatory Visit: Payer: Medicare HMO | Admitting: Neurology

## 2021-11-17 ENCOUNTER — Ambulatory Visit: Payer: Medicare HMO | Admitting: Neurology

## 2021-12-13 DIAGNOSIS — M79644 Pain in right finger(s): Secondary | ICD-10-CM | POA: Diagnosis not present

## 2021-12-13 DIAGNOSIS — M79645 Pain in left finger(s): Secondary | ICD-10-CM | POA: Diagnosis not present

## 2021-12-13 DIAGNOSIS — M189 Osteoarthritis of first carpometacarpal joint, unspecified: Secondary | ICD-10-CM | POA: Diagnosis not present

## 2021-12-13 DIAGNOSIS — M65312 Trigger thumb, left thumb: Secondary | ICD-10-CM | POA: Diagnosis not present

## 2021-12-22 DIAGNOSIS — K5900 Constipation, unspecified: Secondary | ICD-10-CM | POA: Diagnosis not present

## 2021-12-22 DIAGNOSIS — K219 Gastro-esophageal reflux disease without esophagitis: Secondary | ICD-10-CM | POA: Diagnosis not present

## 2021-12-22 DIAGNOSIS — K580 Irritable bowel syndrome with diarrhea: Secondary | ICD-10-CM | POA: Diagnosis not present

## 2021-12-22 DIAGNOSIS — R197 Diarrhea, unspecified: Secondary | ICD-10-CM | POA: Diagnosis not present

## 2022-01-03 DIAGNOSIS — R112 Nausea with vomiting, unspecified: Secondary | ICD-10-CM | POA: Diagnosis not present

## 2022-01-04 ENCOUNTER — Other Ambulatory Visit: Payer: Self-pay | Admitting: Gastroenterology

## 2022-01-04 DIAGNOSIS — R1033 Periumbilical pain: Secondary | ICD-10-CM

## 2022-01-13 ENCOUNTER — Ambulatory Visit
Admission: RE | Admit: 2022-01-13 | Discharge: 2022-01-13 | Disposition: A | Discharge status: Home / Self Care | Payer: Medicare HMO | Source: Ambulatory Visit | Attending: Gastroenterology | Admitting: Gastroenterology

## 2022-01-13 DIAGNOSIS — K439 Ventral hernia without obstruction or gangrene: Secondary | ICD-10-CM | POA: Diagnosis not present

## 2022-01-13 DIAGNOSIS — R112 Nausea with vomiting, unspecified: Secondary | ICD-10-CM | POA: Diagnosis not present

## 2022-01-13 DIAGNOSIS — R1033 Periumbilical pain: Secondary | ICD-10-CM

## 2022-01-13 DIAGNOSIS — K449 Diaphragmatic hernia without obstruction or gangrene: Secondary | ICD-10-CM | POA: Diagnosis not present

## 2022-01-13 DIAGNOSIS — K5730 Diverticulosis of large intestine without perforation or abscess without bleeding: Secondary | ICD-10-CM | POA: Diagnosis not present

## 2022-01-13 MED ORDER — IOPAMIDOL (ISOVUE-300) INJECTION 61%
100.0000 mL | Freq: Once | INTRAVENOUS | Status: AC | PRN
  Administered 2022-01-13: 100 mL via INTRAVENOUS

## 2022-01-20 NOTE — Progress Notes (Signed)
Follow-up Visit   Date: 01/21/2022    Tracy Hunt MRN: [id number] DOB: [birth year]    Tracy Hunt is a 76 y.o. right-handed Caucasian female with hypertension, hypothyroidism, Sjogren's syndrome (followed by Dr. 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  The patient was accompanied to the clinic by self.  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  Risk factors include:  Sjogren's disease, age.  She has painful paresthesias involving the feet.  - Increase gabapentin to '400mg'$  in the morning, continue '300mg'$  in the afternoon, and '400mg'$  at bedtime  - Continue to use cane  - She will be working on balance with her personal trainer  - Going forward, we can refer her for balance PT, if needed  - Patient educated on daily foot inspection, fall prevention, and safety precautions around the home.  Return to clinic in 6 months  --------------------------------------------- History of present illness: For the past 2-3 years, she has achy, burning pain, and numbness involving the feet.  Symptoms are constant, without any identifiable exacerbating or alleviating factors.  She endorses imbalance, and uses a cane only as needed on uneven ground.  No falls. She has chronic low back pain. She takes gabapentin '300mg'$  three times daily which helps reduce her pain, but does not alleviate it. Recent B12 levels and HbA1c were normal.  She lives with daughter in 4 level home.  Nonsmoker, does not drink alcohol.   UPDATE 01/21/2022:  She is here for follow-up visit.  She continues to have tingling and burning in the feet.  It has improved at night time after increasing her bedtime dose to '400mg'$ .  She tried gabapentin '600mg'$ , but felt it made her too groggy.    She had one fall after tripping on something hanging off the bed and required assistance to stand up. She  has started using a cane when she is out and especially on uneven ground. She is working with a Physiological scientist since September.  Medications:  Current Outpatient Medications on File Prior to Visit  Medication Sig Dispense Refill   acetaminophen (TYLENOL) 650 MG CR tablet Take 1,300 mg by mouth in the morning, at noon, and at bedtime. Takes 2 tablets in the morning, 2 in the afternoon, and 2 at bedtime.     brimonidine (ALPHAGAN) 0.15 % ophthalmic solution 1 drop 3 (three) times daily.     Cholecalciferol (VITAMIN D) 50 MCG (2000 UT) tablet Take 2,000 Units by mouth daily.     Cobalamin Combinations (B-12) 1000-400 MCG SUBL Place under the tongue.     cycloSPORINE (RESTASIS) 0.05 % ophthalmic emulsion 1 drop 2 (two) times daily.     dorzolamide-timolol (COSOPT) 22.3-6.8 MG/ML ophthalmic solution Place 1 drop into both eyes in the morning and at bedtime.     estradiol (ESTRACE) 1 MG tablet Take 1 mg by mouth daily.     levothyroxine (SYNTHROID) 125 MCG tablet Take 125 mcg by mouth daily.     Multiple Vitamins-Minerals (CENTRUM SILVER 50+WOMEN PO) Take 1 tablet by mouth daily.     omeprazole (PRILOSEC) 20 MG capsule Take 20 mg by mouth daily.     Propylene Glycol (SYSTANE COMPLETE OP) Apply to eye.     psyllium (METAMUCIL) 58.6 % packet Take 1 packet by mouth daily.     rosuvastatin (CRESTOR) 10 MG tablet Take 10 mg by mouth at bedtime.  triamterene-hydrochlorothiazide (MAXZIDE-25) 37.5-25 MG tablet Take 1 tablet by mouth daily.     bismuth subsalicylate (PEPTO BISMOL) 262 MG chewable tablet Chew 524 mg by mouth as needed for indigestion or diarrhea or loose stools. (Patient not taking: Reported on 06/11/2021)     Carboxymethylcellulose Sodium 1 % GEL Place 1 drop into both eyes at bedtime. (Patient not taking: Reported on 06/11/2021)     hydroxypropyl methylcellulose / hypromellose (ISOPTO TEARS / GONIOVISC) 2.5 % ophthalmic solution Place 1 drop into both eyes daily. (Patient not taking:  Reported on 06/11/2021)     Menthol, Topical Analgesic, (BIOFREEZE EX) Apply 1 drop topically daily as needed (back pain). (Patient not taking: Reported on 06/11/2021)     simethicone (MYLICON) 093 MG chewable tablet Chew 125 mg by mouth every 6 (six) hours as needed for flatulence. (Patient not taking: Reported on 06/11/2021)     traMADol (ULTRAM) 50 MG tablet Take 1 tablet (50 mg total) by mouth every 6 (six) hours as needed for moderate pain or severe pain. (Patient not taking: Reported on 06/11/2021) 30 tablet 0   No current facility-administered medications on file prior to visit.    Allergies:  Allergies  Allergen Reactions   Other Itching    Adhesive tape    Oyster Shell Hives   Shellfish Allergy Nausea And Vomiting    Oysters    Vital Signs:  BP 133/81   Pulse 79   Ht '5\' 3"'$  (1.6 m)   Wt 177 lb (80.3 kg)   SpO2 97%   BMI 31.35 kg/m   Neurological Exam: MENTAL STATUS including orientation to time, place, person, recent and remote memory, attention span and concentration, language, and fund of knowledge is normal.  Speech is not dysarthric.  CRANIAL NERVES:  No visual field defects.  Pupils equal round and reactive to light.  Normal conjugate, extra-ocular eye movements in all directions of gaze.  No ptosis.  Face is symmetric.   MOTOR:  Motor strength is 5/5 in all extremities.  No atrophy, fasciculations or abnormal movements.  No pronator drift.  Tone is normal.    MSRs:  Reflexes are 2+/4 throughout, except 1+/4 at the ankles bilaterally.  SENSORY:   Vibration is reduced a the feet.  Rhomberg testing is positive.   COORDINATION/GAIT:  Gait is mildly-wide based, stable unassisted. .   Data:n/a   Thank you for allowing me to participate in patient's care.  If I can answer any additional questions, I would be pleased to do so.    Sincerely,    Katai Marsico K. Posey Pronto, DO

## 2022-01-21 ENCOUNTER — Encounter: Payer: Self-pay | Admitting: Neurology

## 2022-01-21 ENCOUNTER — Ambulatory Visit (INDEPENDENT_AMBULATORY_CARE_PROVIDER_SITE_OTHER): Payer: Medicare HMO | Admitting: Neurology

## 2022-01-21 VITALS — BP 133/81 | HR 79 | Ht 63.0 in | Wt 177.0 lb

## 2022-01-21 DIAGNOSIS — G609 Hereditary and idiopathic neuropathy, unspecified: Secondary | ICD-10-CM

## 2022-01-21 MED ORDER — GABAPENTIN 300 MG PO CAPS: 300.0000 mg | ORAL_CAPSULE | Freq: Three times a day (TID) | ORAL | 1 refills | Status: DC

## 2022-01-21 MED ORDER — GABAPENTIN 100 MG PO CAPS: ORAL_CAPSULE | ORAL | 3 refills | Status: DC

## 2022-01-21 NOTE — Patient Instructions (Signed)
Increase gabapentin to '100mg'$  twice daily in the morning and bedtime Continue gabapentin '300mg'$  three times daily  Return to clinic in 6 months

## 2022-01-31 DIAGNOSIS — R197 Diarrhea, unspecified: Secondary | ICD-10-CM | POA: Diagnosis not present

## 2022-02-12 ENCOUNTER — Other Ambulatory Visit: Payer: Self-pay | Admitting: Neurology

## 2022-02-14 ENCOUNTER — Other Ambulatory Visit: Payer: Self-pay | Admitting: Internal Medicine

## 2022-02-14 DIAGNOSIS — Z1231 Encounter for screening mammogram for malignant neoplasm of breast: Secondary | ICD-10-CM

## 2022-03-11 DIAGNOSIS — H401131 Primary open-angle glaucoma, bilateral, mild stage: Secondary | ICD-10-CM | POA: Diagnosis not present

## 2022-03-16 ENCOUNTER — Encounter: Payer: Self-pay | Admitting: Obstetrics and Gynecology

## 2022-03-16 ENCOUNTER — Ambulatory Visit (INDEPENDENT_AMBULATORY_CARE_PROVIDER_SITE_OTHER): Payer: Medicare HMO | Admitting: Obstetrics and Gynecology

## 2022-03-16 VITALS — BP 134/81 | HR 73

## 2022-03-16 DIAGNOSIS — N8110 Cystocele, unspecified: Secondary | ICD-10-CM | POA: Diagnosis not present

## 2022-03-16 DIAGNOSIS — N816 Rectocele: Secondary | ICD-10-CM | POA: Diagnosis not present

## 2022-03-16 DIAGNOSIS — N993 Prolapse of vaginal vault after hysterectomy: Secondary | ICD-10-CM

## 2022-03-16 DIAGNOSIS — R300 Dysuria: Secondary | ICD-10-CM | POA: Diagnosis not present

## 2022-03-16 DIAGNOSIS — R350 Frequency of micturition: Secondary | ICD-10-CM

## 2022-03-16 DIAGNOSIS — K5909 Other constipation: Secondary | ICD-10-CM

## 2022-03-16 LAB — POCT URINALYSIS DIPSTICK
Bilirubin, UA: NEGATIVE
Blood, UA: NEGATIVE
Glucose, UA: NEGATIVE
Ketones, UA: 15
Leukocytes, UA: NEGATIVE
Nitrite, UA: NEGATIVE
Protein, UA: NEGATIVE
Spec Grav, UA: 1.015 (ref 1.010–1.025)
Urobilinogen, UA: 1 E.U./dL
pH, UA: 5.5 (ref 5.0–8.0)

## 2022-03-16 MED ORDER — POLYETHYLENE GLYCOL 3350 17 GM/SCOOP PO POWD: 17.0000 g | Freq: Every day | ORAL | 0 refills | Status: DC

## 2022-03-16 MED ORDER — GLYCERIN (ADULT) 2 G RE SUPP: 1.0000 | RECTAL | 0 refills | Status: DC | PRN

## 2022-03-16 MED ORDER — PSYLLIUM 95 % PO PACK: 1.0000 | PACK | Freq: Every day | ORAL | Status: DC

## 2022-03-16 NOTE — Progress Notes (Signed)
[facility] Return Visit  SUBJECTIVE  History of Present Illness: Tracy Hunt is a 76 y.o. female seen in follow-up for pelvic organ prolapse. Plan at last visit was expectantly manage as she was going to have shoulder surgery but she reported she would return if she had concerns.   She reports she is having severe constipation issues. Reports has not had a normal BM in one week. She states she was trying to increase natural fiber with vegetables and has been taking gas x due to the pain in her lower abdomen region. States she took metamucil a few weeks ago and it caused diarrhea. Reports she has been traveling and eating smaller portions.   She endorses sometimes having to put her fingers into her vagina to splint during a BM.   Of note, patient is taking estradiol orally. She reports her husband was an obgyn and when she had her hysterecto   Past Medical History: Patient  has a past medical history of Anemia, Arthritis, Bell's palsy (1660), Complication of anesthesia, Dry eye, Family history of adverse reaction to anesthesia, Glaucoma, Hepatitis (1986), Hypertension, Hypothyroidism, Malaria, Mumps, PONV (postoperative nausea and vomiting), Pre-diabetes, Rosacea, Scoliosis, and Sjogren's syndrome ([place]).   Past Surgical History: She  has a past surgical history that includes Abdominal hysterectomy (1995); Appendectomy (1995); Joint replacement (2012); Cholecystectomy; steroid injections; Eye surgery (Bilateral); Joint replacement; and Reverse shoulder arthroplasty (Left, 10/23/2020).   Medications: She has a current medication list which includes the following prescription(s): acetaminophen, bismuth subsalicylate, brimonidine, carboxymethylcellulose sodium, vitamin d, b-12, cyclosporine, dorzolamide-timolol, estradiol, gabapentin, gabapentin, glycerin adult, hydroxypropyl methylcellulose / hypromellose, levothyroxine, menthol (topical analgesic), multiple  vitamins-minerals, omeprazole, polyethylene glycol powder, propylene glycol, psyllium, rosuvastatin, simethicone, tramadol, and triamterene-hydrochlorothiazide.   Allergies: Patient is allergic to other, oyster shell, and shellfish allergy.   Social History: Patient  reports that she has never smoked. She has never used smokeless tobacco. She reports that she does not currently use alcohol. She reports that she does not use drugs.      OBJECTIVE     Physical Exam: Vitals:   03/16/22 0917  BP: 134/81  Pulse: 73   Gen: No apparent distress, A&O x 3.  Non-tender with abdominal palpation in all quadrants. No obvious hernia or other obvious abdominal abnormalities.   Detailed Urogynecologic Evaluation:  Mild vaginal atrophy, Urethra with caruncle present. 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 No pain with manual palpation in the vaginal muscles. Hard stool felt in the rectum on vaginal exam.   POC Urine: Negative for all componenets     ASSESSMENT AND PLAN    [name] is a 76 y.o. with:  1. Dysuria   2. Chronic constipation   3. Urinary frequency   4. Vaginal vault prolapse after hysterectomy   5. Prolapse of anterior vaginal wall   6. Prolapse of posterior vaginal wall    POC Urine negative today for infection.  Discussed drinking water, taking metamucil and using glycerin suppositories today. Hard stool was noted in the rectum and pushing up into the rectocele which could be contributory to patient's lower abdominal pain.  Attempted pessary fitting without success. Patient given information on possible surgical options and these were briefly reviewed with her. See pessary fitting note for more detailed trial information.  Surgical options briefly reviewed.  Patient reports she will read over surgical options and will come back for a formal surgical discussion with Dr. Wannetta Sender.

## 2022-03-16 NOTE — Patient Instructions (Signed)
Take the Metamucil and do the suppositories as needed for 12 doses. Drink 6 cups of water if you can and continue to try and eat vegetables for fiber.   Information given on surgical options.

## 2022-03-16 NOTE — Progress Notes (Signed)
[facility]   Subjective:     Chief Complaint: Pessary Fitting Kennedie Pardoe Del Portal is a 76 y.o. female here for a pessary fitting and complains of bladder pain./)  History of Present Illness: Demetric Parslow Portal is a 76 y.o. female with stage II pelvic organ prolapse who presents today for a pessary fitting.    Past Medical History: Patient  has a past medical history of Anemia, Arthritis, Bell's palsy (2683), Complication of anesthesia, Dry eye, Family history of adverse reaction to anesthesia, Glaucoma, Hepatitis (1986), Hypertension, Hypothyroidism, Malaria, Mumps, PONV (postoperative nausea and vomiting), Pre-diabetes, Rosacea, Scoliosis, and Sjogren's syndrome ([place]).   Past Surgical History: She  has a past surgical history that includes Abdominal hysterectomy (1995); Appendectomy (1995); Joint replacement (2012); Cholecystectomy; steroid injections; Eye surgery (Bilateral); Joint replacement; and Reverse shoulder arthroplasty (Left, 10/23/2020).   Medications: She has a current medication list which includes the following prescription(s): acetaminophen, bismuth subsalicylate, brimonidine, carboxymethylcellulose sodium, vitamin d, b-12, cyclosporine, dorzolamide-timolol, estradiol, gabapentin, gabapentin, glycerin adult, hydroxypropyl methylcellulose / hypromellose, levothyroxine, menthol (topical analgesic), multiple vitamins-minerals, omeprazole, propylene glycol, psyllium, rosuvastatin, simethicone, tramadol, and triamterene-hydrochlorothiazide.   Allergies: Patient is allergic to other, oyster shell, and shellfish allergy.   Social History: Patient  reports that she has never smoked. She has never used smokeless tobacco. She reports that she does not currently use alcohol. She reports that she does not use drugs.      Objective:    BP 134/81   Pulse 73  Gen: No apparent distress, A&O x 3. Pelvic Exam: Normal external female genitalia; Bartholin's and  Skene's glands normal in appearance; urethral meatus with caruncle, no urethral masses or discharge.   Attempted a size #3 and #4 cube which were pushed out when bending down and squatting.   Attempted a size 3 donut that came out with squatting.   Attempted a size #5 dish that came out with squatting  Attempted a 5 Gellhorn that came out with squatting.    Attempted a #5 Cube which came out when patient went to the bathroom to urinate.   None of the pessaries when tried today worked for patient and were all expelled when patient squatted or attempted urination.    Assessment/Plan:    Assessment: Ms. Nolon Bussing Portal is a 76 y.o. with stage II pelvic organ prolapse who presents for a pessary fitting. Plan: She was not able to find a suitable pessary to fit today.   Follow-up in 4-6 weeks for possible surgical planning discussion with Dr. Wannetta Sender.    She is not currently sexually active and is a widower. Information given on Anterior and Posterior Repair, Sacrospinous ligament fixation, and Colpocleisis. She reports she will review these and talk about it with Dr. Wannetta Sender.       Berton Mount, NP

## 2022-03-21 DIAGNOSIS — A0472 Enterocolitis due to Clostridium difficile, not specified as recurrent: Secondary | ICD-10-CM

## 2022-03-21 HISTORY — DX: Enterocolitis due to Clostridium difficile, not specified as recurrent: A04.72

## 2022-03-22 DIAGNOSIS — K5900 Constipation, unspecified: Secondary | ICD-10-CM | POA: Diagnosis not present

## 2022-03-22 DIAGNOSIS — R634 Abnormal weight loss: Secondary | ICD-10-CM | POA: Diagnosis not present

## 2022-03-22 DIAGNOSIS — R197 Diarrhea, unspecified: Secondary | ICD-10-CM | POA: Diagnosis not present

## 2022-03-22 DIAGNOSIS — R109 Unspecified abdominal pain: Secondary | ICD-10-CM | POA: Diagnosis not present

## 2022-03-23 DIAGNOSIS — E1140 Type 2 diabetes mellitus with diabetic neuropathy, unspecified: Secondary | ICD-10-CM | POA: Diagnosis not present

## 2022-03-23 DIAGNOSIS — Z79890 Hormone replacement therapy (postmenopausal): Secondary | ICD-10-CM | POA: Diagnosis not present

## 2022-03-23 DIAGNOSIS — E785 Hyperlipidemia, unspecified: Secondary | ICD-10-CM | POA: Diagnosis not present

## 2022-03-23 DIAGNOSIS — M3505 Sjogren syndrome with inflammatory arthritis: Secondary | ICD-10-CM | POA: Diagnosis not present

## 2022-03-23 DIAGNOSIS — R209 Unspecified disturbances of skin sensation: Secondary | ICD-10-CM | POA: Diagnosis not present

## 2022-03-23 DIAGNOSIS — D72819 Decreased white blood cell count, unspecified: Secondary | ICD-10-CM | POA: Diagnosis not present

## 2022-03-23 DIAGNOSIS — M1990 Unspecified osteoarthritis, unspecified site: Secondary | ICD-10-CM | POA: Diagnosis not present

## 2022-03-23 DIAGNOSIS — R634 Abnormal weight loss: Secondary | ICD-10-CM | POA: Diagnosis not present

## 2022-03-23 DIAGNOSIS — E039 Hypothyroidism, unspecified: Secondary | ICD-10-CM | POA: Diagnosis not present

## 2022-03-31 ENCOUNTER — Emergency Department (HOSPITAL_BASED_OUTPATIENT_CLINIC_OR_DEPARTMENT_OTHER): Payer: Medicare HMO

## 2022-03-31 ENCOUNTER — Emergency Department (HOSPITAL_BASED_OUTPATIENT_CLINIC_OR_DEPARTMENT_OTHER)
Admission: EM | Admit: 2022-03-31 | Discharge: 2022-03-31 | Disposition: A | Discharge status: Home / Self Care | Payer: Medicare HMO | Attending: Emergency Medicine | Admitting: Emergency Medicine

## 2022-03-31 ENCOUNTER — Encounter (HOSPITAL_BASED_OUTPATIENT_CLINIC_OR_DEPARTMENT_OTHER): Payer: Self-pay | Admitting: Emergency Medicine

## 2022-03-31 ENCOUNTER — Other Ambulatory Visit: Payer: Self-pay

## 2022-03-31 DIAGNOSIS — R197 Diarrhea, unspecified: Secondary | ICD-10-CM | POA: Diagnosis not present

## 2022-03-31 DIAGNOSIS — I10 Essential (primary) hypertension: Secondary | ICD-10-CM | POA: Insufficient documentation

## 2022-03-31 DIAGNOSIS — K5792 Diverticulitis of intestine, part unspecified, without perforation or abscess without bleeding: Secondary | ICD-10-CM

## 2022-03-31 DIAGNOSIS — R1032 Left lower quadrant pain: Secondary | ICD-10-CM | POA: Diagnosis present

## 2022-03-31 DIAGNOSIS — Z79899 Other long term (current) drug therapy: Secondary | ICD-10-CM | POA: Diagnosis not present

## 2022-03-31 DIAGNOSIS — R634 Abnormal weight loss: Secondary | ICD-10-CM | POA: Diagnosis not present

## 2022-03-31 DIAGNOSIS — K439 Ventral hernia without obstruction or gangrene: Secondary | ICD-10-CM | POA: Diagnosis not present

## 2022-03-31 DIAGNOSIS — E039 Hypothyroidism, unspecified: Secondary | ICD-10-CM | POA: Diagnosis not present

## 2022-03-31 DIAGNOSIS — R195 Other fecal abnormalities: Secondary | ICD-10-CM | POA: Diagnosis not present

## 2022-03-31 LAB — COMPREHENSIVE METABOLIC PANEL
ALT: 8 U/L (ref 0–44)
AST: 21 U/L (ref 15–41)
Albumin: 3.2 g/dL — ABNORMAL LOW (ref 3.5–5.0)
Alkaline Phosphatase: 59 U/L (ref 38–126)
Anion gap: 10 (ref 5–15)
BUN: 20 mg/dL (ref 8–23)
CO2: 26 mmol/L (ref 22–32)
Calcium: 9.1 mg/dL (ref 8.9–10.3)
Chloride: 102 mmol/L (ref 98–111)
Creatinine, Ser: 0.8 mg/dL (ref 0.44–1.00)
GFR, Estimated: >60 mL/min (ref >60)
Glucose, Bld: 120 mg/dL — ABNORMAL HIGH (ref 70–99)
Potassium: 3.3 mmol/L — ABNORMAL LOW (ref 3.5–5.1)
Sodium: 138 mmol/L (ref 135–145)
Total Bilirubin: 0.7 mg/dL (ref 0.3–1.2)
Total Protein: 6.8 g/dL (ref 6.5–8.1)

## 2022-03-31 LAB — URINALYSIS, ROUTINE W REFLEX MICROSCOPIC
Bilirubin Urine: NEGATIVE
Glucose, UA: NEGATIVE mg/dL
Hgb urine dipstick: NEGATIVE
Ketones, ur: NEGATIVE mg/dL
Leukocytes,Ua: NEGATIVE
Nitrite: NEGATIVE
Specific Gravity, Urine: 1.034 — ABNORMAL HIGH (ref 1.005–1.030)
pH: 6 (ref 5.0–8.0)

## 2022-03-31 LAB — CBC
HCT: 37.1 % (ref 36.0–46.0)
Hemoglobin: 12.2 g/dL (ref 12.0–15.0)
MCH: 31.8 pg (ref 26.0–34.0)
MCHC: 32.9 g/dL (ref 30.0–36.0)
MCV: 96.6 fL (ref 80.0–100.0)
Platelets: 133 10*3/uL — ABNORMAL LOW (ref 150–400)
RBC: 3.84 MIL/uL — ABNORMAL LOW (ref 3.87–5.11)
RDW: 14.2 % (ref 11.5–15.5)
WBC: 5.8 10*3/uL (ref 4.0–10.5)
nRBC: 0 % (ref 0.0–0.2)

## 2022-03-31 LAB — LIPASE, BLOOD: Lipase: 12 U/L (ref 11–51)

## 2022-03-31 MED ORDER — AMOXICILLIN-POT CLAVULANATE 875-125 MG PO TABS: 1.0000 | ORAL_TABLET | Freq: Two times a day (BID) | ORAL | 0 refills | Status: DC

## 2022-03-31 MED ORDER — AMOXICILLIN-POT CLAVULANATE 875-125 MG PO TABS
1.0000 | ORAL_TABLET | Freq: Once | ORAL | Status: AC
  Administered 2022-03-31: 1 via ORAL
  Filled 2022-03-31: qty 1

## 2022-03-31 MED ORDER — IOHEXOL 300 MG/ML  SOLN
100.0000 mL | Freq: Once | INTRAMUSCULAR | Status: AC | PRN
  Administered 2022-03-31: 80 mL via INTRAVENOUS

## 2022-03-31 MED ORDER — ACETAMINOPHEN 500 MG PO TABS
1000.0000 mg | ORAL_TABLET | Freq: Once | ORAL | Status: AC
  Administered 2022-03-31: 1000 mg via ORAL
  Filled 2022-03-31: qty 2

## 2022-03-31 NOTE — Discharge Instructions (Addendum)
Thank you for coming to [facility] Emergency Department. You were seen for abdominal pain, diarrhea. We did an exam, labs, and imaging, and these showed diverticulitis. We will prescribe augmentin twice per day for one week.   Please follow up with your gastroenterologist within a few weeks.   Do not hesitate to return to the ED or call 911 if you experience: -Worsening symptoms -Worsening left sided abdominal pain -Diarrhea or nausea worsening to the point that you cannot stay hydrated -Lightheadedness, passing out -Fevers/chills -Anything else that concerns you

## 2022-03-31 NOTE — ED Triage Notes (Signed)
Pt arrives to ED wit c/o LLQ abdominal pain and fevers. She notes 40lb weight loss over last four months.

## 2022-03-31 NOTE — ED Provider Notes (Signed)
[facility] EMERGENCY DEPT Provider Note   CSN: [id number] Arrival date & time: 03/31/22  1656     History  Chief Complaint  Patient presents with   Abdominal Pain    Tracy Hunt is a 77 y.o. female with HTN, hypothyroidism, arthritis, hepatitis, impairment of balance, peptic ulcer, lumbar radiculopathy who presents with abd pain.   Pt arrives to ED with her daughter c/o LLQ abdominal pain and fevers. She has struggled with mucousy diarrhea intermittently with constipation and nausea/emesis every several weeks for the last months to years, worsening in October.  However patient notes a change over the last few days with left lower quadrant abdominal pain now with associated right upper quadrant abdominal pain and diarrhea.  No melena/hematochezia.  Also has had fevers Tmax 1-1.2 Fahrenheit starting yesterday and today. She notes 40lb weight loss over last four months. Is currently being worked up by 2 gastroenterologists, Dr. Benson Norway at [facility] and Dr. Oren Beckmann at [facility]. Had recent tests including high fecal calprotectin, low pancreatic elastase. Last EGD/colonoscopy in 2021 showed small hiatal hernia, normal stomach/duodenum, 1 small tubular adenoma and diverticulosis.   Per chart review, was instructed to take metamucil and miralax on 03/22/21. Was also prescribed hyoscyamine for cramping/diarrhea. Also notes she sees a urogynecologist for bladder prolapse chronically. Has h/o cholecystectomy in 2013, appendectomy in 1995, and hysterectomy in 1995.    Abdominal Pain      Home Medications Prior to Admission medications   Medication Sig Start Date End Date Taking? Authorizing Provider  acetaminophen (TYLENOL) 650 MG CR tablet Take 1,300 mg by mouth in the morning, at noon, and at bedtime. Takes 2 tablets in the morning, 2 in the afternoon, and 2 at bedtime. 03/04/19   [provider]  bismuth subsalicylate (PEPTO BISMOL) 262 MG chewable tablet Chew 524 mg by  mouth as needed for indigestion or diarrhea or loose stools.    [provider]  brimonidine (ALPHAGAN) 0.15 % ophthalmic solution 1 drop 3 (three) times daily.    [provider]  Carboxymethylcellulose Sodium 1 % GEL Place 1 drop into both eyes at bedtime.    [provider]  Cholecalciferol (VITAMIN D) 50 MCG (2000 UT) tablet Take 2,000 Units by mouth daily.    [provider]  Cobalamin Combinations (B-12) 1000-400 MCG SUBL Place under the tongue.    [provider]  cycloSPORINE (RESTASIS) 0.05 % ophthalmic emulsion 1 drop 2 (two) times daily.    [provider]  dorzolamide-timolol (COSOPT) 22.3-6.8 MG/ML ophthalmic solution Place 1 drop into both eyes in the morning and at bedtime. 09/13/20   [provider]  estradiol (ESTRACE) 1 MG tablet Take 1 mg by mouth daily. 09/27/20   [provider]  gabapentin (NEURONTIN) 100 MG capsule Take 1 tablet in the morning and bedtime along with '300mg'$  tablets. 01/21/22   Patel, Arvin Collard K, DO  gabapentin (NEURONTIN) 300 MG capsule Take 1 capsule (300 mg total) by mouth 3 (three) times daily. 01/21/22   Narda Amber K, DO  glycerin adult 2 g suppository Place 1 suppository rectally as needed for constipation. 03/16/22   Berton Mount, NP  hydroxypropyl methylcellulose / hypromellose (ISOPTO TEARS / GONIOVISC) 2.5 % ophthalmic solution Place 1 drop into both eyes daily.    [provider]  levothyroxine (SYNTHROID) 125 MCG tablet Take 125 mcg by mouth daily. 07/20/20   [provider]  Menthol, Topical Analgesic, (BIOFREEZE EX) Apply 1 drop topically daily as needed (  back pain).    [provider]  Multiple Vitamins-Minerals (CENTRUM SILVER 50+WOMEN PO) Take 1 tablet by mouth daily.    [provider]  omeprazole (PRILOSEC) 20 MG capsule Take 20 mg by mouth daily. 07/20/20   [provider]  Propylene Glycol (SYSTANE COMPLETE OP) Apply to eye.     [provider]  psyllium (METAMUCIL) 58.6 % packet Take 1 packet by mouth daily.    [provider]  rosuvastatin (CRESTOR) 10 MG tablet Take 10 mg by mouth at bedtime. 04/15/20   [provider]  simethicone (MYLICON) 474 MG chewable tablet Chew 125 mg by mouth every 6 (six) hours as needed for flatulence.    [provider]  traMADol (ULTRAM) 50 MG tablet Take 1 tablet (50 mg total) by mouth every 6 (six) hours as needed for moderate pain or severe pain. 10/23/20   Netta Cedars, MD  triamterene-hydrochlorothiazide (MAXZIDE-25) 37.5-25 MG tablet Take 1 tablet by mouth daily. 09/29/20   [provider]      Allergies    Other, Oyster shell, and Shellfish allergy    Review of Systems   Review of Systems  Gastrointestinal:  Positive for abdominal pain.   Review of systems Positive for f/c.  A 10 point review of systems was performed and is negative unless otherwise reported in HPI.  Physical Exam Updated Vital Signs BP (!) 117/95 (BP Location: Right Arm)   Pulse 84   Temp 98.1 F (36.7 C) (Temporal)   Resp 16   Ht '5\' 3"'$  (1.6 m)   Wt 71.2 kg   SpO2 100%   BMI 27.81 kg/m  Physical Exam General: Normal appearing female, lying in bed.  HEENT: Sclera anicteric, MMM, trachea midline.  Cardiology: RRR, no murmurs/rubs/gallops. BL radial and DP pulses equal bilaterally.  Resp: Normal respiratory rate and effort. CTAB, no wheezes, rhonchi, crackles.  Abd: TTP in LLQ. Soft, non-distended. No rebound tenderness or guarding.  GU: Deferred. MSK: No peripheral edema or signs of trauma. Extremities without deformity or TTP. No cyanosis or clubbing. Skin: warm, dry. No rashes or lesions. Back: No CVA tenderness Neuro: A&Ox4, CNs II-XII grossly intact. MAEs. Sensation grossly intact.  Psych: Normal mood and affect.   ED Results / Procedures / Treatments   Labs (all labs ordered are listed, but only abnormal results are displayed) Labs Reviewed   COMPREHENSIVE METABOLIC PANEL - Abnormal; Notable for the following components:      Result Value   Potassium 3.3 (*)    Glucose, Bld 120 (*)    Albumin 3.2 (*)    All other components within normal limits  CBC - Abnormal; Notable for the following components:   RBC 3.84 (*)    Platelets 133 (*)    All other components within normal limits  LIPASE, BLOOD  URINALYSIS, ROUTINE W REFLEX MICROSCOPIC    EKG None  Radiology No results found.  Procedures Procedures    Medications Ordered in ED Medications - No data to display  ED Course/ Medical Decision Making/ A&P                          Medical Decision Making Amount and/or Complexity of Data Reviewed Labs: ordered. Decision-making details documented in ED Course. Radiology: ordered. Decision-making details documented in ED Course.  Risk OTC drugs. Prescription drug management.    This patient presents to the ED for concern of fever and abdominal pain., this involves an extensive  number of treatment options, and is a complaint that carries with it a high risk of complications and morbidity.  I considered the following differential and admission for this acute, potentially life threatening condition.   MDM:    For DDX for abdominal pain includes but is not limited to:  Abdominal exam without peritoneal signs. No evidence of acute abdomen at this time.  Patient with acute on chronic gastrointestinal symptoms.  Now with left lower quadrant pain, right upper quadrant pain which is new for her as well as a fever which is also new.  Consider diverticulitis at top of differential.  Consider biliary colic though patient has h/o cholecystectomy. Also consider viral gastroenteritis which could also explain fever and diarrhea.  She actually just yesterday had C. difficile and stool cultures which were reassuringly negative.  Patient is here today hemodynamically stable and afebrile, very well-appearing, low concern for sepsis at  this time. She is already well-plugged in with GI undergoing active workup for her chronic intermittent N/V/D/C, but will rule out acute emergencies today. Lower c/f acute pancreatitis (neg lipase), pyelonephritis, vascular catastrophe, bowel obstruction, viscus perforation. H/o appendectomy.   Clinical Course as of 04/01/22 1524  Thu Mar 31, 2022  2036 CT ABDOMEN PELVIS W CONTRAST 1. Findings compatible with acute uncomplicated sigmoid diverticulitis. 2. Aortic Atherosclerosis (ICD10-I70.0).   [HN]  2036 Urinalysis, Routine w reflex microscopic Urine, Clean Catch(!) Unremarkable [HN]  2036 Potassium(!): 3.3 Will replete [HN]  2036 Comprehensive metabolic panel(!) Otherwise unremarkable in context of presentation [HN]  2036 Lipase: 12 Neg [HN]  2036 WBC: 5.8 [HN]  2036 Hemoglobin: 12.2 [HN]  2036 Patient with uncomplicated sigmoid diverticulitis.  [HN]  2215 Shared decision making with patient and her daughter related to the antibiotics and the risks of potentially worsening her already-existing diarrhea with no significant data behind benefit of abx treatment. Patient and daughter decided they would like to proceed with antibiotic treatment with augmentin.  [HN]    Clinical Course User Index [HN] Audley Hose, MD    Labs: I Ordered, and personally interpreted labs.  The pertinent results include:  those listed above  Imaging Studies ordered: I ordered imaging studies including CT A/P I independently visualized and interpreted imaging. I agree with the radiologist interpretation  Additional history obtained from chart review.  External records from outside source obtained and reviewed including [facility]  Reevaluation: After the interventions noted above, I reevaluated the patient and found that they have :stayed the same  Social Determinants of Health: Patient lives independently  Disposition: Discussed extensively with patient and her daughter at bedside.  Will  treat with Augmentin for diverticulitis and patient will continue to follow-up with GI as originally scheduled.  She encouraged to stay well-hydrated at home.  DC into care of daughter, DC instructions/return precautions  Co morbidities that complicate the patient evaluation  Past Medical History:  Diagnosis Date   Anemia    Arthritis    Bell's palsy 5726   Complication of anesthesia    Hard to wake up after a  colonoscopy before  around 2000   Dry eye    Family history of adverse reaction to anesthesia    mom hard to wake up and had lethargy after anesthesia   Glaucoma    Hepatitis 1986   secondary to varicella was in hospital for 2 days   Hypertension    Hypothyroidism    Malaria    as as child   Mumps  PONV (postoperative nausea and vomiting)    Pre-diabetes    Rosacea    Scoliosis    Sjogren's syndrome ([place])      Medicines No orders of the defined types were placed in this encounter.   I have reviewed the patients home medicines and have made adjustments as needed  Problem List / ED Course: Problem List Items Addressed This Visit   None Visit Diagnoses     Diverticulitis    -  Primary                   This note was created using dictation software, which may contain spelling or grammatical errors.    Audley Hose, MD 04/01/22 [phone]

## 2022-04-01 ENCOUNTER — Telehealth (HOSPITAL_COMMUNITY): Payer: Self-pay | Admitting: Emergency Medicine

## 2022-04-01 NOTE — Telephone Encounter (Signed)
Called [name]. I had received a call from radiology Dr. Christa See about an addendum to her CT A/P read from our emergency department visit yesterday.   Addendum reads: Upon further review, the inflammatory changes involving the sigmoid colon are relatively extensive involving the majority of the sigmoid colon and extending to involve the rectum and anus. While more severe within the sigmoid colon, the extent of disease involves regions geographically remote from moderate background sigmoid diverticulosis and the imaging findings favor that of a infectious or inflammatory proctocolitis as can be seen with pseudomembranous colitis or inflammatory bowel disease. Moderate colonic stool burden is noted proximal to the inflamed segment without abnormal dilation of this bowel to suggest a resultant obstruction. No free intraperitoneal gas, fluid, or loculated pericolonic fluid collections to suggest perforation. Reactive lymph node and within the sigmoid mesentery. The inferior mesenteric vein and artery appear patent.  Patient already being worked up as an outpatient for IBD. Low c/f pseudomembranous colitis as she very recently had a negative c diff toxin.   I discussed with [name] the addendum and she relayed understanding. She stated she is still having dull LLQ pain but is managing at home and staying hydrated. She is continuing the augmentin that was prescribed. She will follow with GI as originally planned.

## 2022-04-04 ENCOUNTER — Encounter (HOSPITAL_COMMUNITY): Payer: Self-pay

## 2022-04-04 ENCOUNTER — Inpatient Hospital Stay (HOSPITAL_COMMUNITY)
Admission: EM | Admit: 2022-04-04 | Discharge: 2022-04-08 | DRG: 392 | Disposition: A | Discharge status: Home / Self Care | Payer: Medicare HMO | Attending: Internal Medicine | Admitting: Internal Medicine

## 2022-04-04 ENCOUNTER — Emergency Department (HOSPITAL_COMMUNITY): Payer: Medicare HMO

## 2022-04-04 DIAGNOSIS — Z833 Family history of diabetes mellitus: Secondary | ICD-10-CM

## 2022-04-04 DIAGNOSIS — K6389 Other specified diseases of intestine: Secondary | ICD-10-CM | POA: Diagnosis not present

## 2022-04-04 DIAGNOSIS — K5792 Diverticulitis of intestine, part unspecified, without perforation or abscess without bleeding: Secondary | ICD-10-CM | POA: Diagnosis present

## 2022-04-04 DIAGNOSIS — K5733 Diverticulitis of large intestine without perforation or abscess with bleeding: Secondary | ICD-10-CM | POA: Diagnosis not present

## 2022-04-04 DIAGNOSIS — M79671 Pain in right foot: Secondary | ICD-10-CM

## 2022-04-04 DIAGNOSIS — G629 Polyneuropathy, unspecified: Secondary | ICD-10-CM | POA: Diagnosis present

## 2022-04-04 DIAGNOSIS — E860 Dehydration: Secondary | ICD-10-CM | POA: Diagnosis not present

## 2022-04-04 DIAGNOSIS — E876 Hypokalemia: Secondary | ICD-10-CM | POA: Diagnosis not present

## 2022-04-04 DIAGNOSIS — H409 Unspecified glaucoma: Secondary | ICD-10-CM | POA: Diagnosis present

## 2022-04-04 DIAGNOSIS — K529 Noninfective gastroenteritis and colitis, unspecified: Secondary | ICD-10-CM

## 2022-04-04 DIAGNOSIS — R1084 Generalized abdominal pain: Secondary | ICD-10-CM | POA: Diagnosis not present

## 2022-04-04 DIAGNOSIS — M3500 Sicca syndrome, unspecified: Secondary | ICD-10-CM | POA: Diagnosis not present

## 2022-04-04 DIAGNOSIS — Z803 Family history of malignant neoplasm of breast: Secondary | ICD-10-CM | POA: Diagnosis not present

## 2022-04-04 DIAGNOSIS — Z800 Family history of malignant neoplasm of digestive organs: Secondary | ICD-10-CM

## 2022-04-04 DIAGNOSIS — Z79899 Other long term (current) drug therapy: Secondary | ICD-10-CM | POA: Diagnosis not present

## 2022-04-04 DIAGNOSIS — Z96612 Presence of left artificial shoulder joint: Secondary | ICD-10-CM | POA: Diagnosis present

## 2022-04-04 DIAGNOSIS — K6289 Other specified diseases of anus and rectum: Secondary | ICD-10-CM | POA: Diagnosis present

## 2022-04-04 DIAGNOSIS — R197 Diarrhea, unspecified: Secondary | ICD-10-CM | POA: Diagnosis not present

## 2022-04-04 DIAGNOSIS — Z8249 Family history of ischemic heart disease and other diseases of the circulatory system: Secondary | ICD-10-CM

## 2022-04-04 DIAGNOSIS — K582 Mixed irritable bowel syndrome: Secondary | ICD-10-CM | POA: Diagnosis not present

## 2022-04-04 DIAGNOSIS — R9431 Abnormal electrocardiogram [ECG] [EKG]: Secondary | ICD-10-CM | POA: Diagnosis not present

## 2022-04-04 DIAGNOSIS — K8689 Other specified diseases of pancreas: Secondary | ICD-10-CM | POA: Diagnosis not present

## 2022-04-04 DIAGNOSIS — D649 Anemia, unspecified: Secondary | ICD-10-CM | POA: Diagnosis not present

## 2022-04-04 DIAGNOSIS — Z91013 Allergy to seafood: Secondary | ICD-10-CM | POA: Diagnosis not present

## 2022-04-04 DIAGNOSIS — Z79890 Hormone replacement therapy (postmenopausal): Secondary | ICD-10-CM | POA: Diagnosis not present

## 2022-04-04 DIAGNOSIS — K8681 Exocrine pancreatic insufficiency: Secondary | ICD-10-CM | POA: Diagnosis not present

## 2022-04-04 DIAGNOSIS — K5730 Diverticulosis of large intestine without perforation or abscess without bleeding: Secondary | ICD-10-CM | POA: Diagnosis not present

## 2022-04-04 DIAGNOSIS — K5732 Diverticulitis of large intestine without perforation or abscess without bleeding: Secondary | ICD-10-CM | POA: Diagnosis not present

## 2022-04-04 DIAGNOSIS — E039 Hypothyroidism, unspecified: Secondary | ICD-10-CM | POA: Diagnosis not present

## 2022-04-04 DIAGNOSIS — R109 Unspecified abdominal pain: Secondary | ICD-10-CM

## 2022-04-04 DIAGNOSIS — M109 Gout, unspecified: Secondary | ICD-10-CM | POA: Diagnosis present

## 2022-04-04 DIAGNOSIS — I10 Essential (primary) hypertension: Secondary | ICD-10-CM | POA: Diagnosis present

## 2022-04-04 DIAGNOSIS — Z801 Family history of malignant neoplasm of trachea, bronchus and lung: Secondary | ICD-10-CM

## 2022-04-04 DIAGNOSIS — R634 Abnormal weight loss: Secondary | ICD-10-CM | POA: Diagnosis not present

## 2022-04-04 LAB — URINALYSIS, ROUTINE W REFLEX MICROSCOPIC
Bilirubin Urine: NEGATIVE
Glucose, UA: NEGATIVE mg/dL
Hgb urine dipstick: NEGATIVE
Ketones, ur: 80 mg/dL — AB
Leukocytes,Ua: NEGATIVE
Nitrite: NEGATIVE
Protein, ur: NEGATIVE mg/dL
Specific Gravity, Urine: >1.046 — ABNORMAL HIGH (ref 1.005–1.030)
pH: 6 (ref 5.0–8.0)

## 2022-04-04 LAB — MAGNESIUM: Magnesium: 1.1 mg/dL — ABNORMAL LOW (ref 1.7–2.4)

## 2022-04-04 LAB — COMPREHENSIVE METABOLIC PANEL
ALT: 12 U/L (ref 0–44)
AST: 21 U/L (ref 15–41)
Albumin: 2.7 g/dL — ABNORMAL LOW (ref 3.5–5.0)
Alkaline Phosphatase: 62 U/L (ref 38–126)
Anion gap: 14 (ref 5–15)
BUN: 6 mg/dL — ABNORMAL LOW (ref 8–23)
CO2: 20 mmol/L — ABNORMAL LOW (ref 22–32)
Calcium: 8.6 mg/dL — ABNORMAL LOW (ref 8.9–10.3)
Chloride: 106 mmol/L (ref 98–111)
Creatinine, Ser: 0.67 mg/dL (ref 0.44–1.00)
GFR, Estimated: >60 mL/min (ref >60)
Glucose, Bld: 80 mg/dL (ref 70–99)
Potassium: 2.7 mmol/L — CL (ref 3.5–5.1)
Sodium: 140 mmol/L (ref 135–145)
Total Bilirubin: 0.8 mg/dL (ref 0.3–1.2)
Total Protein: 6.8 g/dL (ref 6.5–8.1)

## 2022-04-04 LAB — CBC
HCT: 41.6 % (ref 36.0–46.0)
Hemoglobin: 13.8 g/dL (ref 12.0–15.0)
MCH: 32.2 pg (ref 26.0–34.0)
MCHC: 33.2 g/dL (ref 30.0–36.0)
MCV: 97 fL (ref 80.0–100.0)
Platelets: 177 10*3/uL (ref 150–400)
RBC: 4.29 MIL/uL (ref 3.87–5.11)
RDW: 13.6 % (ref 11.5–15.5)
WBC: 6.3 10*3/uL (ref 4.0–10.5)
nRBC: 0 % (ref 0.0–0.2)

## 2022-04-04 LAB — LACTIC ACID, PLASMA
Lactic Acid, Venous: 1.4 mmol/L (ref 0.5–1.9)
Lactic Acid, Venous: 1 mmol/L (ref 0.5–1.9)

## 2022-04-04 LAB — LIPASE, BLOOD: Lipase: 28 U/L (ref 11–51)

## 2022-04-04 LAB — TSH: TSH: 0.033 u[IU]/mL — ABNORMAL LOW (ref 0.350–4.500)

## 2022-04-04 MED ORDER — PANTOPRAZOLE SODIUM 40 MG PO TBEC
40.0000 mg | DELAYED_RELEASE_TABLET | Freq: Every day | ORAL | Status: DC
  Administered 2022-04-05 – 2022-04-08 (×4): 40 mg via ORAL
  Filled 2022-04-04 (×4): qty 1

## 2022-04-04 MED ORDER — POTASSIUM CHLORIDE 10 MEQ/100ML IV SOLN
10.0000 meq | Freq: Once | INTRAVENOUS | Status: AC
  Administered 2022-04-04: 10 meq via INTRAVENOUS
  Filled 2022-04-04: qty 100

## 2022-04-04 MED ORDER — POLYVINYL ALCOHOL 1.4 % OP SOLN: 1.0000 [drp] | Freq: Four times a day (QID) | OPHTHALMIC | Status: DC | PRN

## 2022-04-04 MED ORDER — ROSUVASTATIN CALCIUM 5 MG PO TABS
10.0000 mg | ORAL_TABLET | Freq: Every day | ORAL | Status: DC
  Administered 2022-04-05 – 2022-04-07 (×3): 10 mg via ORAL
  Filled 2022-04-04 (×3): qty 2

## 2022-04-04 MED ORDER — VITAMIN D 25 MCG (1000 UNIT) PO TABS
2000.0000 [IU] | ORAL_TABLET | Freq: Every day | ORAL | Status: DC
  Administered 2022-04-05 – 2022-04-08 (×4): 2000 [IU] via ORAL
  Filled 2022-04-04 (×4): qty 2

## 2022-04-04 MED ORDER — POTASSIUM CHLORIDE CRYS ER 20 MEQ PO TBCR
40.0000 meq | EXTENDED_RELEASE_TABLET | Freq: Once | ORAL | Status: AC
  Administered 2022-04-04: 40 meq via ORAL
  Filled 2022-04-04: qty 2

## 2022-04-04 MED ORDER — LEVOTHYROXINE SODIUM 112 MCG PO TABS
112.0000 ug | ORAL_TABLET | Freq: Every day | ORAL | Status: DC
  Administered 2022-04-05 – 2022-04-08 (×4): 112 ug via ORAL
  Filled 2022-04-04 (×4): qty 1

## 2022-04-04 MED ORDER — SODIUM CHLORIDE 0.9 % IV SOLN
1.0000 g | INTRAVENOUS | Status: DC
  Administered 2022-04-04: 1 g via INTRAVENOUS
  Filled 2022-04-04: qty 10

## 2022-04-04 MED ORDER — SODIUM CHLORIDE 0.9 % IV SOLN: INTRAVENOUS | Status: DC

## 2022-04-04 MED ORDER — ACETAMINOPHEN 500 MG PO TABS
1000.0000 mg | ORAL_TABLET | Freq: Three times a day (TID) | ORAL | Status: DC
  Administered 2022-04-04 – 2022-04-05 (×2): 1000 mg via ORAL
  Filled 2022-04-04 (×2): qty 2

## 2022-04-04 MED ORDER — METRONIDAZOLE 500 MG/100ML IV SOLN
500.0000 mg | Freq: Two times a day (BID) | INTRAVENOUS | Status: DC
  Administered 2022-04-04 – 2022-04-08 (×8): 500 mg via INTRAVENOUS
  Filled 2022-04-04 (×8): qty 100

## 2022-04-04 MED ORDER — GABAPENTIN 300 MG PO CAPS
300.0000 mg | ORAL_CAPSULE | Freq: Three times a day (TID) | ORAL | Status: DC
  Administered 2022-04-04: 300 mg via ORAL
  Filled 2022-04-04: qty 1

## 2022-04-04 MED ORDER — ENOXAPARIN SODIUM 40 MG/0.4ML IJ SOSY
40.0000 mg | PREFILLED_SYRINGE | INTRAMUSCULAR | Status: DC
  Administered 2022-04-04 – 2022-04-07 (×4): 40 mg via SUBCUTANEOUS
  Filled 2022-04-04 (×4): qty 0.4

## 2022-04-04 MED ORDER — ESTRADIOL 0.5 MG PO TABS
1.0000 mg | ORAL_TABLET | Freq: Every day | ORAL | Status: DC
  Administered 2022-04-05 – 2022-04-08 (×4): 1 mg via ORAL
  Filled 2022-04-04 (×4): qty 2

## 2022-04-04 MED ORDER — GABAPENTIN 100 MG PO CAPS
100.0000 mg | ORAL_CAPSULE | Freq: Every day | ORAL | Status: DC
  Administered 2022-04-04: 100 mg via ORAL
  Filled 2022-04-04: qty 1

## 2022-04-04 MED ORDER — IOHEXOL 350 MG/ML SOLN
75.0000 mL | Freq: Once | INTRAVENOUS | Status: AC | PRN
  Administered 2022-04-04: 75 mL via INTRAVENOUS

## 2022-04-04 MED ORDER — MAGNESIUM SULFATE 2 GM/50ML IV SOLN
2.0000 g | Freq: Once | INTRAVENOUS | Status: AC
  Administered 2022-04-04: 2 g via INTRAVENOUS
  Filled 2022-04-04: qty 50

## 2022-04-04 MED ORDER — GABAPENTIN 300 MG PO CAPS: 300.0000 mg | ORAL_CAPSULE | Freq: Three times a day (TID) | ORAL | Status: DC

## 2022-04-04 NOTE — Assessment & Plan Note (Signed)
-  Controlled. Recently taken off antihypertensives by PCP

## 2022-04-04 NOTE — Assessment & Plan Note (Signed)
-  Mild. Check Magnesium level.

## 2022-04-04 NOTE — Assessment & Plan Note (Signed)
-  pt to continue her Combigan eye drops BID

## 2022-04-04 NOTE — Assessment & Plan Note (Addendum)
-  TSH is pending but recently had synthroid decrease about 2 weeks ago so will not reflect any immediate changes.  -Pt recently on 140mg and believes she was decrease to 112 mcg but did not have her home med list updated - will continue but will need to verify again tomorrow the new dosage

## 2022-04-04 NOTE — Assessment & Plan Note (Signed)
-  pain started this morning. Pain from mid-dorsal foot to high ankle, also pain to heel area. Improves with walking. No trauma -right foot X-ray in ED negative -likely MSK related with component of plantar fascitis. Recommends performing stretching exercise.

## 2022-04-04 NOTE — H&P (Signed)
History and Physical    Patient: Tracy Hunt Portal UUV:[id number] DOB: Oct 13, 1945 DOA: 04/04/2022 DOS: the patient was seen and examined on 04/04/2022 PCP: Prince Solian, MD  Patient coming from: Home  Chief Complaint:  Chief Complaint  Patient presents with   Abdominal Pain   Weakness   HPI: Tracy Hunt Del Portal is a 77 y.o. female with medical history significant of hypertension, hypothyroidism, Sjogen's syndrome, peripheral neuropathy, bell's palsy and pelvic organ prolapse who presents with worsening diarrhea, abdominal pain.   She has been followed outpatient with GI Dr. Benson Norway and Dr. Oren Beckmann at [facility]. Has long-standing issues of bouts of constipation and diarrhea  for years thought to be IBS. Symptoms have worsened since Oct last year and was referred to GI at [facility]. Has noticed worsening symptoms to dairy and eggs. Has bladder prolapse followed by urogynecology. 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  Had further testing on 03/31/22 with pancreatic elastase of <67 and fecal calprotectin elevated at 610.  C.diff was positive for Ag but not toxin more consistent with colonization.  GI panel was negative.   She presented to [facility] ED on 03/31/22 with worsening abdominal pain with continued intermittent mucus diarrhea with constipation. CT A/P initially revealing for uncomplicated sigmoid diverticulitis and discharged with Augmentin. There was later an over-read on this imaging with concerns of inflammatory changes involving the sigmoid colon extending to rectum and anus. Findings more favor that of infectious/inflammatory proctocolitis vs IBD. Since then she has been on liquid diet and feels weak and dehydrated. Continues to have loose stool and increase urge to have bowel movement. Have rectal pain.   In the ED, she was afebrile, normotensive on room air.  CBC was unremarkable without  leukocytosis or anemia.  Lactate within normal limits.  Sodium 140, K of 2.7, creatinine of 0.67, CBG of 80.  LFT is negative.  Lipase within normal limits.  Repeat CT A/P showing decreased bowel wall thickening involving the sigmoid colon and rectum. There are scattered scattered diverticula along the proximal to the sigmoid colon with surrounding inflammatory changes suggesting superimposed diverticulitis.  GI Dr.Hung's group was consulted and recommended admission and will see in the morning. Hospitalist then consulted for admission.    Review of Systems: As mentioned in the history of present illness. All other systems reviewed and are negative. Past Medical History:  Diagnosis Date   Anemia    Arthritis    Bell's palsy 4742   Complication of anesthesia    Hard to wake up after a  colonoscopy before  around 2000   Dry eye    Family history of adverse reaction to anesthesia    mom hard to wake up and had lethargy after anesthesia   Glaucoma    Hepatitis 1986   secondary to varicella was in hospital for 2 days   Hypertension    Hypothyroidism    Malaria    as as child   Mumps    PONV (postoperative nausea and vomiting)    Pre-diabetes    Rosacea    Scoliosis    Sjogren's syndrome ([place])    Past Surgical History:  Procedure Laterality Date   [place] Bilateral    cataract   JOINT REPLACEMENT  2012   Left knee   JOINT REPLACEMENT  right knee 2017   REVERSE SHOULDER ARTHROPLASTY Left 10/23/2020   Procedure: REVERSE SHOULDER ARTHROPLASTY;  Surgeon: Netta Cedars, MD;  Location: [facility];  Service: Orthopedics;  Laterality: Left;  with ISB   steroid injections     L3,L4, L5   Social History:  reports that she has never smoked. She has never used smokeless tobacco. She reports that she does not currently use alcohol. She reports that she does not use drugs.  Allergies  Allergen Reactions    Other Itching    Adhesive tape    Oyster Shell Hives   Shellfish Allergy Nausea And Vomiting    Oysters    Family History  Problem Relation Age of Onset   Colon cancer Mother    Lung cancer Mother    Hypertension Mother    Diabetes Father    Breast cancer Paternal Grandmother     Prior to Admission medications   Medication Sig Start Date End Date Taking? Authorizing Provider  acetaminophen (TYLENOL) 650 MG CR tablet Take 1,300 mg by mouth in the morning, at noon, and at bedtime. Takes 2 tablets in the morning, 2 in the afternoon, and 2 at bedtime. 03/04/19   [provider]  amoxicillin-clavulanate (AUGMENTIN) 875-125 MG tablet Take 1 tablet by mouth every 12 (twelve) hours. 03/31/22   Audley Hose, MD  bismuth subsalicylate (PEPTO BISMOL) 262 MG chewable tablet Chew 524 mg by mouth as needed for indigestion or diarrhea or loose stools.    [provider]  brimonidine (ALPHAGAN) 0.15 % ophthalmic solution 1 drop 3 (three) times daily.    [provider]  Carboxymethylcellulose Sodium 1 % GEL Place 1 drop into both eyes at bedtime.    [provider]  Cholecalciferol (VITAMIN D) 50 MCG (2000 UT) tablet Take 2,000 Units by mouth daily.    [provider]  Cobalamin Combinations (B-12) 1000-400 MCG SUBL Place under the tongue.    [provider]  cycloSPORINE (RESTASIS) 0.05 % ophthalmic emulsion 1 drop 2 (two) times daily.    [provider]  dorzolamide-timolol (COSOPT) 22.3-6.8 MG/ML ophthalmic solution Place 1 drop into both eyes in the morning and at bedtime. 09/13/20   [provider]  estradiol (ESTRACE) 1 MG tablet Take 1 mg by mouth daily. 09/27/20   [provider]  gabapentin (NEURONTIN) 100 MG capsule Take 1 tablet in the morning and bedtime along with '300mg'$  tablets. 01/21/22   Patel, Arvin Collard K, DO  gabapentin (NEURONTIN) 300 MG capsule Take 1 capsule (300 mg total) by mouth 3 (three) times daily.  01/21/22   Narda Amber K, DO  glycerin adult 2 g suppository Place 1 suppository rectally as needed for constipation. 03/16/22   Berton Mount, NP  hydroxypropyl methylcellulose / hypromellose (ISOPTO TEARS / GONIOVISC) 2.5 % ophthalmic solution Place 1 drop into both eyes daily.    [provider]  levothyroxine (SYNTHROID) 125 MCG tablet Take 125 mcg by mouth daily. 07/20/20   [provider]  Menthol, Topical Analgesic, (BIOFREEZE EX) Apply 1 drop topically daily as needed (back pain).    [provider]  Multiple Vitamins-Minerals (CENTRUM SILVER 50+WOMEN PO) Take 1 tablet by mouth daily.    [provider]  omeprazole (PRILOSEC) 20 MG capsule Take 20 mg by mouth daily. 07/20/20   [provider]  Propylene Glycol (SYSTANE COMPLETE OP) Apply to eye.    [provider]  psyllium (METAMUCIL) 58.6 % packet Take 1 packet by mouth daily.  [provider]  rosuvastatin (CRESTOR) 10 MG tablet Take 10 mg by mouth at bedtime. 04/15/20   [provider]  simethicone (MYLICON) 916 MG chewable tablet Chew 125 mg by mouth every 6 (six) hours as needed for flatulence.    [provider]  traMADol (ULTRAM) 50 MG tablet Take 1 tablet (50 mg total) by mouth every 6 (six) hours as needed for moderate pain or severe pain. 10/23/20   Netta Cedars, MD  triamterene-hydrochlorothiazide (MAXZIDE-25) 37.5-25 MG tablet Take 1 tablet by mouth daily. 09/29/20   [provider]    Physical Exam: Vitals:   04/04/22 1433 04/04/22 1846  BP: 135/69 (!) 148/65  Pulse: 68 97  Resp: 18 18  Temp: 98.8 F (37.1 C) 98.2 F (36.8 C)  TempSrc:  Oral  SpO2: 100% 99%   Constitutional: NAD, calm, comfortable, nontoxic appearing elderly female laying flat in bed Eyes: lids and conjunctivae normal ENMT: Mucous membranes are moist.  Neck: normal, supple Respiratory: clear to auscultation bilaterally, no wheezing, no crackles. Normal  respiratory effort. No accessory muscle use.  Cardiovascular: Regular rate and rhythm, no murmurs / rubs / gallops. No extremity edema.  Abdomen: Soft, non-tender, nondistended.  Bowel sounds positive.  Rectal:cluster of perianal skin tags at 9 and 6'o clock. No prolapse.No erythema or edema. No blood. No stool.  Musculoskeletal: no clubbing / cyanosis. No joint deformity upper and lower extremities. Good ROM, no contractures. Normal muscle tone. Pain with deep palpation of the right mid-dorsal foot  Skin: no rashes, lesions, ulcers. No induration Neurologic: CN 2-12 grossly intact.  Strength 5/5 in all 4.  Psychiatric: Normal judgment and insight. Alert and oriented x 3. Normal mood. Data Reviewed:  See HPI  Assessment and Plan: * Acute diverticulitis -Seen on 03/31/22 and d/c with Augment. CT imaging today shows improvement-will continue treating with IV Rocephin and IV Flagyl.  Abdominal pain -long standing history of alternating bowel movement with diarrhea and constipation -Had testing on 03/31/22 with pancreatic elastase of <67 and fecal calprotectin elevated at 610. GI Dr[facility] Luiz [facility]choa at [facility] wanted her diverticulitis symptoms to resolve before starting on enzymes -03/31/22 showed C.diff was positive for Ag but not toxin more consistent with colonization. Repeat C.diff testing pending today.  -03/31/22 -GI panel was negative.  -repeat CT A/P today shows continual bowel wall thickening of the sigmoid and rectum although improved from prior CT A/P on 03/31/22 with concerns for IBD. GI Dr. Ulyses Amor group will consult tomorrow with recommendation. Potentially will need steroid.  -CT scan shows persistent acute diverticulitis so will continue to treat with IV antibotics   Glaucoma -pt to continue her Combigan eye drops BID  Right foot pain -pain started this morning. Pain from mid-dorsal foot to high ankle, also pain to heel area. Improves with walking. No trauma -right foot X-ray in ED  negative -likely MSK related with component of plantar fascitis. Recommends performing stretching exercise.  Hypocalcemia -Mild. Check Magnesium level.  Hypokalemia -Potassium of 2.7 on presentation -administered oral and IV potassium -will also check Magnesium  HTN (hypertension) -Controlled. Recently taken off antihypertensives by PCP  Hypothyroidism -TSH is pending but recently had synthroid decrease about 2 weeks ago so will not reflect any immediate changes.  -Pt recently on 172mg and believes she was decrease to 112 mcg but did not have her home med list updated - will continue but will need to verify again tomorrow the new dosage      Advance Care Planning:  Code Status: Full Code   Consults: GI  Family Communication: daughter at bedside  Severity of Illness: The appropriate patient status for this patient is OBSERVATION. Observation status is judged to be reasonable and necessary in order to provide the required intensity of service to ensure the patient's safety. The patient's presenting symptoms, physical exam findings, and initial radiographic and laboratory data in the context of their medical condition is felt to place them at decreased risk for further clinical deterioration. Furthermore, it is anticipated that the patient will be medically stable for discharge from the hospital within 2 midnights of admission.   Author: Orene Desanctis, DO 04/04/2022 10:10 PM  For on call review [email or web address].

## 2022-04-04 NOTE — ED Provider Triage Note (Signed)
Emergency Medicine Provider Triage Evaluation Note  Tracy Hunt , a 77 y.o. female  was evaluated in triage.  Pt complains of diagnosed with diverticulitis after operation 4 days ago, discharged on clear liquid diet, Augmentin, was doing okay until generalized weakness, ongoing pain, feeling extremely weak.  She reports some small bowel movements, denies any blood in stool. Hypokalemia noted during previous ED visit. Also notes some new right foot pain, feels different than neuropathy.  Review of Systems  Positive: Abdominal pain, diarrhea, weakness Negative: Chest pain, shob  Physical Exam  BP 135/69   Pulse 68   Temp 98.8 F (37.1 C)   Resp 18   SpO2 100%  Gen:   Awake, no distress   Resp:  Normal effort  MSK:   Moves extremities without difficulty  Other:  Focal ttp in RLQ  Medical Decision Making  Medically screening exam initiated at 2:34 PM.  Appropriate orders placed.  [facility] [name] was informed that the remainder of the evaluation will be completed by another provider, this initial triage assessment does not replace that evaluation, and the importance of remaining in the ED until their evaluation is complete.  Workup initiated   Anselmo Pickler, [place] 04/04/22 1436

## 2022-04-04 NOTE — ED Provider Notes (Signed)
[facility] EMERGENCY DEPARTMENT Provider Note   CSN: [id number] Arrival date & time: 04/04/22  1352     History  Chief Complaint  Patient presents with   Abdominal Pain   Weakness    Tracy Hunt is a 77 y.o. female.  Tracy Hunt is a 77 year old female past medical history of hypothyroidism and chronic diarrhea presenting to the emergency department with worsening symptoms of diarrhea.  She recently presented to [facility] for acute worsening abdominal pain.  During that ED visit she was diagnosed with diverticulitis and given an outpatient prescription for Augmentin.  Patient states she is follows with GI who is performed rather large workup including GI pathogen panel, fecal calprotectin in order to figure out the cause of her chronic abdominal symptoms.  According to the patient's daughter who provides some of the history at bedside patient has had diarrhea since October with weight loss of 40 pounds.  Since starting Augmentin she states that her symptoms have improved somewhat however she is still experiencing urge to defecate without the ability to has been having multiple stools per day.  She denies any fevers or chills.  She denies any nausea or vomiting.    Abdominal Pain Weakness Associated symptoms: abdominal pain        Home Medications Prior to Admission medications   Medication Sig Start Date End Date Taking? Authorizing Provider  acetaminophen (TYLENOL) 650 MG CR tablet Take 1,300 mg by mouth in the morning, at noon, and at bedtime. Takes 2 tablets in the morning, 2 in the afternoon, and 2 at bedtime. 03/04/19   [provider]  amoxicillin-clavulanate (AUGMENTIN) 875-125 MG tablet Take 1 tablet by mouth every 12 (twelve) hours. 03/31/22   Audley Hose, MD  bismuth subsalicylate (PEPTO BISMOL) 262 MG chewable tablet Chew 524 mg by mouth as needed for indigestion or diarrhea or loose stools.    [provider]  brimonidine (ALPHAGAN) 0.15 % ophthalmic solution 1 drop 3 (three) times daily.    [provider]  Carboxymethylcellulose Sodium 1 % GEL Place 1 drop into both eyes at bedtime.    [provider]  Cholecalciferol (VITAMIN D) 50 MCG (2000 UT) tablet Take 2,000 Units by mouth daily.    [provider]  Cobalamin Combinations (B-12) 1000-400 MCG SUBL Place under the tongue.    [provider]  cycloSPORINE (RESTASIS) 0.05 % ophthalmic emulsion 1 drop 2 (two) times daily.    [provider]  dorzolamide-timolol (COSOPT) 22.3-6.8 MG/ML ophthalmic solution Place 1 drop into both eyes in the morning and at bedtime. 09/13/20   [provider]  estradiol (ESTRACE) 1 MG tablet Take 1 mg by mouth daily. 09/27/20   [provider]  gabapentin (NEURONTIN) 100 MG capsule Take 1 tablet in the morning and bedtime along with '300mg'$  tablets. 01/21/22   Patel, Arvin Collard K, DO  gabapentin (NEURONTIN) 300 MG capsule Take 1 capsule (300 mg total) by mouth 3 (three) times daily. 01/21/22   Narda Amber K, DO  glycerin adult 2 g suppository Place 1 suppository rectally as needed for constipation. 03/16/22   Berton Mount, NP  hydroxypropyl methylcellulose / hypromellose (ISOPTO TEARS / GONIOVISC) 2.5 % ophthalmic solution Place 1 drop into both eyes daily.    [provider]  levothyroxine (SYNTHROID) 125 MCG tablet Take 125 mcg by mouth daily. 07/20/20   [provider]  Menthol, Topical Analgesic, (BIOFREEZE EX) Apply 1 drop topically daily as  needed (back pain).    [provider]  Multiple Vitamins-Minerals (CENTRUM SILVER 50+WOMEN PO) Take 1 tablet by mouth daily.    [provider]  omeprazole (PRILOSEC) 20 MG capsule Take 20 mg by mouth daily. 07/20/20   [provider]  Propylene Glycol (SYSTANE COMPLETE OP) Apply to eye.    [provider]  psyllium (METAMUCIL) 58.6 % packet Take 1 packet by mouth  daily.    [provider]  rosuvastatin (CRESTOR) 10 MG tablet Take 10 mg by mouth at bedtime. 04/15/20   [provider]  simethicone (MYLICON) 354 MG chewable tablet Chew 125 mg by mouth every 6 (six) hours as needed for flatulence.    [provider]  traMADol (ULTRAM) 50 MG tablet Take 1 tablet (50 mg total) by mouth every 6 (six) hours as needed for moderate pain or severe pain. 10/23/20   Netta Cedars, MD  triamterene-hydrochlorothiazide (MAXZIDE-25) 37.5-25 MG tablet Take 1 tablet by mouth daily. 09/29/20   [provider]      Allergies    Other, Oyster shell, and Shellfish allergy    Review of Systems   Review of Systems  Gastrointestinal:  Positive for abdominal pain.  Neurological:  Positive for weakness.    Physical Exam Updated Vital Signs BP (!) 148/65 (BP Location: Right Arm)   Pulse 97   Temp 98.2 F (36.8 C) (Oral)   Resp 18   SpO2 99%  Physical Exam Vitals and nursing note reviewed.  Constitutional:      General: She is not in acute distress.    Appearance: She is well-developed.  HENT:     Head: Normocephalic and atraumatic.  Eyes:     Conjunctiva/sclera: Conjunctivae normal.  Cardiovascular:     Rate and Rhythm: Normal rate and regular rhythm.     Heart sounds: No murmur heard. Pulmonary:     Effort: Pulmonary effort is normal. No respiratory distress.     Breath sounds: Normal breath sounds.  Abdominal:     General: Abdomen is flat. There is no distension or abdominal bruit. There are no signs of injury.     Palpations: Abdomen is soft.     Tenderness: There is generalized abdominal tenderness.  Musculoskeletal:        General: No swelling.     Cervical back: Neck supple.  Skin:    General: Skin is warm and dry.     Capillary Refill: Capillary refill takes less than 2 seconds.  Neurological:     Mental Status: She is alert.  Psychiatric:        Mood and Affect: Mood normal.     ED Results / Procedures /  Treatments   Labs (all labs ordered are listed, but only abnormal results are displayed) Labs Reviewed  COMPREHENSIVE METABOLIC PANEL - Abnormal; Notable for the following components:      Result Value   Potassium 2.7 (*)    CO2 20 (*)    BUN 6 (*)    Calcium 8.6 (*)    Albumin 2.7 (*)    All other components within normal limits  URINALYSIS, ROUTINE W REFLEX MICROSCOPIC - Abnormal; Notable for the following components:   Specific Gravity, Urine >1.046 (*)    Ketones, ur 80 (*)    All other components within normal limits  C DIFFICILE QUICK SCREEN W PCR REFLEX    LIPASE, BLOOD  CBC  LACTIC ACID, PLASMA  LACTIC ACID, PLASMA  TSH  MAGNESIUM  BASIC  METABOLIC PANEL    EKG None  Radiology CT ABDOMEN PELVIS W CONTRAST  Result Date: 04/04/2022 CLINICAL DATA:  Abdominal pain, acute, nonlocalized. Clinical concern for inflammatory bowel disease. EXAM: CT ABDOMEN AND PELVIS WITH CONTRAST TECHNIQUE: Multidetector CT imaging of the abdomen and pelvis was performed using the standard protocol following bolus administration of intravenous contrast. RADIATION DOSE REDUCTION: This exam was performed according to the departmental dose-optimization program which includes automated exposure control, adjustment of the mA and/or kV according to patient size and/or use of iterative reconstruction technique. CONTRAST:  31m OMNIPAQUE IOHEXOL 350 MG/ML SOLN COMPARISON:  03/31/2022. FINDINGS: Lower chest: No acute abnormality.  A small hiatal hernia is noted. Hepatobiliary: No focal liver abnormality is seen. Status post cholecystectomy. No biliary dilatation. Pancreas: Unremarkable. No pancreatic ductal dilatation or surrounding inflammatory changes. Spleen: Normal size.  Calcified granuloma are noted. Adrenals/Urinary Tract: No adrenal nodule or mass. The kidneys enhance symmetrically. No renal calculus or hydronephrosis. The bladder is unremarkable. Stomach/Bowel: There is a small hiatal hernia. The  stomach is within normal limits. No bowel obstruction, free air, or pneumatosis. Scattered diverticula are present along the colon. There is bowel wall thickening involving the sigmoid colon and proximal rectum common decreased. There is persistent pericolonic fat stranding involving the proximal to mid sigmoid colon. No abscess is identified. The appendix is not seen. Vascular/Lymphatic: Aortic atherosclerosis. No enlarged abdominal or pelvic lymph nodes. Reproductive: Status post hysterectomy. No adnexal masses. Other: No abdominopelvic ascites. Fat containing hernia in the ventral abdominal wall in the right upper quadrant. Musculoskeletal: Degenerative changes in the thoracolumbar spine. No acute or suspicious osseous abnormality. IMPRESSION: 1. Decreased bowel wall thickening involving the sigmoid colon and rectum, improved from the prior exam. There are scattered diverticula along the proximal to mid to sigmoid colon with surrounding inflammatory changes suggesting superimposed diverticulitis, unchanged. 2. Aortic atherosclerosis. Electronically Signed   By: LBrett FairyM.D.   On: 04/04/2022 20:24   DG Foot Complete Right  Result Date: 04/04/2022 CLINICAL DATA:  Right foot pain common no known injury, initial encounter EXAM: RIGHT FOOT COMPLETE - 3+ VIEW COMPARISON:  None Available. FINDINGS: Calcaneal spurring is noted. No acute fracture or dislocation is noted. No soft tissue abnormality is seen. Mild tarsal degenerative changes are noted. IMPRESSION: Degenerative change without acute abnormality. Electronically Signed   By: MInez CatalinaM.D.   On: 04/04/2022 19:33    Procedures Procedures    Medications Ordered in ED Medications  cefTRIAXone (ROCEPHIN) 1 g in sodium chloride 0.9 % 100 mL IVPB (1 g Intravenous New Bag/Given 04/04/22 2218)  metroNIDAZOLE (FLAGYL) IVPB 500 mg (500 mg Intravenous New Bag/Given 04/04/22 2217)  enoxaparin (LOVENOX) injection 40 mg (40 mg Subcutaneous Given 04/04/22  2218)  0.9 %  sodium chloride infusion ( Intravenous New Bag/Given 04/04/22 2216)  rosuvastatin (CRESTOR) tablet 10 mg (has no administration in time range)  estradiol (ESTRACE) tablet 1 mg (has no administration in time range)  pantoprazole (PROTONIX) EC tablet 40 mg (has no administration in time range)  cholecalciferol (VITAMIN D3) 25 MCG (1000 UNIT) tablet 2,000 Units (has no administration in time range)  polyvinyl alcohol (LIQUIFILM TEARS) 1.4 % ophthalmic solution 1 drop (has no administration in time range)  gabapentin (NEURONTIN) capsule 100 mg (has no administration in time range)  levothyroxine (SYNTHROID) tablet 112 mcg (has no administration in time range)  gabapentin (NEURONTIN) capsule 300 mg (has no administration in time range)  potassium chloride 10 mEq in 100 mL IVPB (0 mEq  Intravenous Stopped 04/04/22 2112)  potassium chloride SA (KLOR-CON M) CR tablet 40 mEq (40 mEq Oral Given 04/04/22 1935)  iohexol (OMNIPAQUE) 350 MG/ML injection 75 mL (75 mLs Intravenous Contrast Given 04/04/22 2003)    ED Course/ Medical Decision Making/ A&P                             Medical Decision Making [facility] [name] is a 77 year old female past medical history as documented above presenting to the emergency department for persistent diarrhea.  Upon chart review of this patient appears she has been following with GI for some time who was performed extensive workup for inflammatory bowel disease as well as infectious etiologies of her abdominal pain.  On outside lab review it appears that patient has had an elevated fecal calprotectin but negative C. difficile toxin studies.  She has no formal diagnosis of IBD.  On recent presentation to emergency department she was diagnosed with diverticulitis based off of CT scan and CT over read of that initial study shows severe inflammatory changes to the rectosigmoid colon concerning for pseudomembranous colitis versus inflammatory bowel disease.  She  has been compliant on her Augmentin at home and states minor improvement of her bowel symptoms however she is persistently having tenesmus with refractory diarrhea.  On my initial assessment she is normotensive blood pressure 148/65 she is nontachycardic with heart rates in the 90s she is afebrile temperature 98.2.  Patient's abdominal exam shows generalized tenderness with no focality.  Patient CBC today shows no elevated white blood cells hemoglobin platelets are baseline lipase is within normal limits patient CMP shows potassium of 2.7 which is an acute decrease patient's bicarb is at 20.  Both of these values are in line with persistent diarrhea patient's lactic acid today is 1.0 urine shows no acute evidence of infection.  Her constellation of symptoms of most concern for inflammatory bowel disease while C. difficile is on the differential patient has no acutely elevated white blood cells denies mucousy stools and recently had a negative C. difficile toxin test.  Given her previously documented elevated fecal calprotectin I am more concerned for inflammatory bowel disease given her gross electrolyte abnormalities today and persistent symptoms I reach out to her GI physician for consult and discussion with GI physician patient will be admitted to hospitalist service with GI consult for potential IV steroids and further management.  We ordered a follow-up CT scan of the patient's abdomen as well as repeat C. difficile toxin.  Patient was admitted to hospitalist service for further management.   Problems Addressed: Colitis: undiagnosed new problem with uncertain prognosis  Amount and/or Complexity of Data Reviewed Radiology: ordered.  Risk Prescription drug management. Decision regarding hospitalization.           Final Clinical Impression(s) / ED Diagnoses Final diagnoses:  Diarrhea, unspecified type  Colitis    Rx / DC Orders ED Discharge Orders     None         Donzetta Matters, MD 04/04/22 0388    Davonna Belling, MD 04/05/22 [phone]

## 2022-04-04 NOTE — ED Triage Notes (Addendum)
Pt evaluated at [facility] Thursday; endorses gi prob, diarrhea x 4 months; ct scan showed diverticulitis; d/c'd with abx and clear liquid diet; since then increasing generalized weakness, abd discomfort, bowel incontinence, diarrhea; denies cp, sob; endorses R foot pain that began last night; denies injury; states she has neuropathy, but this feels different

## 2022-04-04 NOTE — Assessment & Plan Note (Signed)
-  Potassium of 2.7 on presentation -administered oral and IV potassium -will also check Magnesium

## 2022-04-04 NOTE — ED Notes (Signed)
Patient transported to X-ray 

## 2022-04-04 NOTE — ED Notes (Signed)
Pt reports weakness since the weekend 04/01/22 and now is also reporting right foot pain.  No new orders at this time.

## 2022-04-04 NOTE — Assessment & Plan Note (Addendum)
-  long standing history of alternating bowel movement with diarrhea and constipation -Had testing on 03/31/22 with pancreatic elastase of <67 and fecal calprotectin elevated at 610. GI Dr. Luiz Ochoa at [facility] wanted her diverticulitis symptoms to resolve before starting on enzymes -03/31/22 showed C.diff was positive for Ag but not toxin more consistent with colonization. Repeat C.diff testing pending today.  -03/31/22 -GI panel was negative.  -repeat CT A/P today shows continual bowel wall thickening of the sigmoid and rectum although improved from prior CT A/P on 03/31/22 with concerns for IBD. GI Dr. Ulyses Amor group will consult tomorrow with recommendation. Potentially will need steroid.  -CT scan shows persistent acute diverticulitis so will continue to treat with IV antibotics

## 2022-04-04 NOTE — Assessment & Plan Note (Signed)
-  Seen on 03/31/22 and d/c with Augment. CT imaging today shows improvement-will continue treating with IV Rocephin and IV Flagyl.

## 2022-04-05 ENCOUNTER — Other Ambulatory Visit: Payer: Self-pay

## 2022-04-05 DIAGNOSIS — K5792 Diverticulitis of intestine, part unspecified, without perforation or abscess without bleeding: Secondary | ICD-10-CM | POA: Diagnosis present

## 2022-04-05 DIAGNOSIS — Z96612 Presence of left artificial shoulder joint: Secondary | ICD-10-CM | POA: Diagnosis present

## 2022-04-05 DIAGNOSIS — I10 Essential (primary) hypertension: Secondary | ICD-10-CM | POA: Diagnosis present

## 2022-04-05 DIAGNOSIS — H409 Unspecified glaucoma: Secondary | ICD-10-CM | POA: Diagnosis present

## 2022-04-05 DIAGNOSIS — K6289 Other specified diseases of anus and rectum: Secondary | ICD-10-CM | POA: Diagnosis present

## 2022-04-05 DIAGNOSIS — Z79899 Other long term (current) drug therapy: Secondary | ICD-10-CM | POA: Diagnosis not present

## 2022-04-05 DIAGNOSIS — E876 Hypokalemia: Secondary | ICD-10-CM | POA: Diagnosis present

## 2022-04-05 DIAGNOSIS — E860 Dehydration: Secondary | ICD-10-CM | POA: Diagnosis present

## 2022-04-05 DIAGNOSIS — E039 Hypothyroidism, unspecified: Secondary | ICD-10-CM | POA: Diagnosis present

## 2022-04-05 DIAGNOSIS — Z79890 Hormone replacement therapy (postmenopausal): Secondary | ICD-10-CM | POA: Diagnosis not present

## 2022-04-05 DIAGNOSIS — Z801 Family history of malignant neoplasm of trachea, bronchus and lung: Secondary | ICD-10-CM | POA: Diagnosis not present

## 2022-04-05 DIAGNOSIS — Z8249 Family history of ischemic heart disease and other diseases of the circulatory system: Secondary | ICD-10-CM | POA: Diagnosis not present

## 2022-04-05 DIAGNOSIS — Z800 Family history of malignant neoplasm of digestive organs: Secondary | ICD-10-CM | POA: Diagnosis not present

## 2022-04-05 DIAGNOSIS — Z803 Family history of malignant neoplasm of breast: Secondary | ICD-10-CM | POA: Diagnosis not present

## 2022-04-05 DIAGNOSIS — K5732 Diverticulitis of large intestine without perforation or abscess without bleeding: Secondary | ICD-10-CM | POA: Diagnosis present

## 2022-04-05 DIAGNOSIS — G629 Polyneuropathy, unspecified: Secondary | ICD-10-CM | POA: Diagnosis present

## 2022-04-05 DIAGNOSIS — D649 Anemia, unspecified: Secondary | ICD-10-CM | POA: Diagnosis present

## 2022-04-05 DIAGNOSIS — M3500 Sicca syndrome, unspecified: Secondary | ICD-10-CM | POA: Diagnosis present

## 2022-04-05 DIAGNOSIS — Z833 Family history of diabetes mellitus: Secondary | ICD-10-CM | POA: Diagnosis not present

## 2022-04-05 DIAGNOSIS — K529 Noninfective gastroenteritis and colitis, unspecified: Secondary | ICD-10-CM | POA: Diagnosis present

## 2022-04-05 DIAGNOSIS — Z91013 Allergy to seafood: Secondary | ICD-10-CM | POA: Diagnosis not present

## 2022-04-05 DIAGNOSIS — M109 Gout, unspecified: Secondary | ICD-10-CM | POA: Diagnosis present

## 2022-04-05 LAB — BASIC METABOLIC PANEL
Anion gap: 13 (ref 5–15)
BUN: 6 mg/dL — ABNORMAL LOW (ref 8–23)
CO2: 19 mmol/L — ABNORMAL LOW (ref 22–32)
Calcium: 8.1 mg/dL — ABNORMAL LOW (ref 8.9–10.3)
Chloride: 107 mmol/L (ref 98–111)
Creatinine, Ser: 0.68 mg/dL (ref 0.44–1.00)
GFR, Estimated: >60 mL/min (ref >60)
Glucose, Bld: 69 mg/dL — ABNORMAL LOW (ref 70–99)
Potassium: 3.1 mmol/L — ABNORMAL LOW (ref 3.5–5.1)
Sodium: 139 mmol/L (ref 135–145)

## 2022-04-05 LAB — PHOSPHORUS: Phosphorus: 2.8 mg/dL (ref 2.5–4.6)

## 2022-04-05 LAB — C DIFFICILE QUICK SCREEN W PCR REFLEX
C Diff antigen: POSITIVE — AB
C Diff toxin: NEGATIVE

## 2022-04-05 LAB — CLOSTRIDIUM DIFFICILE BY PCR, REFLEXED: Toxigenic C. Difficile by PCR: POSITIVE — AB

## 2022-04-05 LAB — MAGNESIUM: Magnesium: 1.5 mg/dL — ABNORMAL LOW (ref 1.7–2.4)

## 2022-04-05 MED ORDER — IPRATROPIUM-ALBUTEROL 0.5-2.5 (3) MG/3ML IN SOLN: 3.0000 mL | RESPIRATORY_TRACT | Status: DC | PRN

## 2022-04-05 MED ORDER — SODIUM CHLORIDE 0.9 % IV SOLN
2.0000 g | INTRAVENOUS | Status: DC
  Administered 2022-04-05 – 2022-04-07 (×3): 2 g via INTRAVENOUS
  Filled 2022-04-05 (×3): qty 20

## 2022-04-05 MED ORDER — HYDRALAZINE HCL 20 MG/ML IJ SOLN: 10.0000 mg | INTRAMUSCULAR | Status: DC | PRN

## 2022-04-05 MED ORDER — SENNOSIDES-DOCUSATE SODIUM 8.6-50 MG PO TABS: 1.0000 | ORAL_TABLET | Freq: Every evening | ORAL | Status: DC | PRN

## 2022-04-05 MED ORDER — METOPROLOL TARTRATE 5 MG/5ML IV SOLN: 5.0000 mg | INTRAVENOUS | Status: DC | PRN

## 2022-04-05 MED ORDER — GABAPENTIN 400 MG PO CAPS
400.0000 mg | ORAL_CAPSULE | Freq: Two times a day (BID) | ORAL | Status: DC
  Administered 2022-04-05 – 2022-04-08 (×6): 400 mg via ORAL
  Filled 2022-04-05 (×6): qty 1

## 2022-04-05 MED ORDER — GABAPENTIN 300 MG PO CAPS
300.0000 mg | ORAL_CAPSULE | Freq: Every day | ORAL | Status: DC
  Administered 2022-04-05 – 2022-04-08 (×4): 300 mg via ORAL
  Filled 2022-04-05 (×4): qty 1

## 2022-04-05 MED ORDER — MAGNESIUM SULFATE 2 GM/50ML IV SOLN
2.0000 g | Freq: Once | INTRAVENOUS | Status: AC
  Administered 2022-04-05: 2 g via INTRAVENOUS
  Filled 2022-04-05: qty 50

## 2022-04-05 MED ORDER — GUAIFENESIN 100 MG/5ML PO LIQD: 5.0000 mL | ORAL | Status: DC | PRN

## 2022-04-05 MED ORDER — SODIUM CHLORIDE 0.9 % IV SOLN: INTRAVENOUS | Status: AC

## 2022-04-05 MED ORDER — ACETAMINOPHEN 325 MG PO TABS
650.0000 mg | ORAL_TABLET | Freq: Four times a day (QID) | ORAL | Status: DC | PRN
  Administered 2022-04-06 – 2022-04-07 (×3): 650 mg via ORAL
  Filled 2022-04-05 (×3): qty 2

## 2022-04-05 MED ORDER — ONDANSETRON HCL 4 MG/2ML IJ SOLN: 4.0000 mg | Freq: Four times a day (QID) | INTRAMUSCULAR | Status: DC | PRN

## 2022-04-05 MED ORDER — PANCRELIPASE (LIP-PROT-AMYL) 36000-114000 UNITS PO CPEP
72000.0000 [IU] | ORAL_CAPSULE | Freq: Three times a day (TID) | ORAL | Status: DC
  Administered 2022-04-05 – 2022-04-08 (×9): 72000 [IU] via ORAL
  Filled 2022-04-05 (×10): qty 2

## 2022-04-05 MED ORDER — TRAMADOL HCL 50 MG PO TABS: 50.0000 mg | ORAL_TABLET | Freq: Four times a day (QID) | ORAL | Status: DC | PRN

## 2022-04-05 NOTE — ED Notes (Signed)
ED TO INPATIENT HANDOFF REPORT  ED Nurse Name and Phone #: Bryson Ha [phone]  S Name/Age/Gender Tracy [facility] 77 y.o. female Room/Bed: H012C/H012C  Code Status   Code Status: Full Code  Home/SNF/Other Home Patient oriented to: self, place, time, and situation Is this baseline? Yes   Triage Complete: Triage complete  Chief Complaint Acute diverticulitis [K57.92]  Triage Note Pt evaluated at [facility] Thursday; endorses gi prob, diarrhea x 4 months; ct scan showed diverticulitis; d/c'd with abx and clear liquid diet; since then increasing generalized weakness, abd discomfort, bowel incontinence, diarrhea; denies cp, sob; endorses R foot pain that began last night; denies injury; states she has neuropathy, but this feels different   Allergies Allergies  Allergen Reactions   Other Itching    Adhesive tape    Oyster Shell Hives   Shellfish Allergy Nausea And Vomiting    Oysters    Level of Care/Admitting Diagnosis ED Disposition     ED Disposition  Admit   Condition  --   [place]: [place] [[id number]]  Level of Care: Med-Surg [16]  May place patient in observation at [facility] or [place] if equivalent level of care is available:: No  Covid Evaluation: Asymptomatic - no recent exposure (last 10 days) testing not required  Diagnosis: Acute diverticulitis [[id number]]  Admitting Physician: Orene Desanctis [[id number]]  Attending Physician: Orene Desanctis [[id number]]          B Medical/Surgery History Past Medical History:  Diagnosis Date   Anemia    Arthritis    Bell's palsy 2202   Complication of anesthesia    Hard to wake up after a  colonoscopy before  around 2000   Dry eye    Family history of adverse reaction to anesthesia    mom hard to wake up and had lethargy after anesthesia   Glaucoma    Hepatitis 1986   secondary to varicella was in hospital for 2 days   Hypertension    Hypothyroidism    Malaria    as as child    Mumps    PONV (postoperative nausea and vomiting)    Pre-diabetes    Rosacea    Scoliosis    Sjogren's syndrome ([place])    Past Surgical History:  Procedure Laterality Date   [place] Bilateral    cataract   JOINT REPLACEMENT  2012   Left knee   JOINT REPLACEMENT     right knee 2017   REVERSE SHOULDER ARTHROPLASTY Left 10/23/2020   Procedure: REVERSE SHOULDER ARTHROPLASTY;  Surgeon: Netta Cedars, MD;  Location: [facility];  Service: Orthopedics;  Laterality: Left;  with ISB   steroid injections     L3,L4, L5     A IV Location/Drains/Wounds Patient Lines/Drains/Airways Status     Active Line/Drains/Airways     Name Placement date Placement time Site Days   Peripheral IV 04/04/22 Left Antecubital 04/04/22  1932  Antecubital  1            Intake/Output Last 24 hours No intake or output data in the 24 hours ending 04/05/22 0114  Labs/Imaging Results for orders placed or performed during the hospital encounter of 04/04/22 (from the past 48 hour(s))  Lipase, blood     Status: None   Collection Time: 04/04/22  2:46 PM  Result Value Ref Range   Lipase 28 11 -  51 U/L    Comment: Performed at [facility], [place] [address] [place], [place] [address]  Comprehensive metabolic panel     Status: Abnormal   Collection Time: 04/04/22  2:46 PM  Result Value Ref Range   Sodium 140 135 - 145 mmol/L   Potassium 2.7 (LL) 3.5 - 5.1 mmol/L    Comment: CRITICAL RESULT CALLED TO, READ BACK BY AND VERIFIED WITH C. LOWE RTN 04/04/22 '@1534'$  BY J. WHITE   Chloride 106 98 - 111 mmol/L   CO2 20 (L) 22 - 32 mmol/L   Glucose, Bld 80 70 - 99 mg/dL    Comment: Glucose reference range applies only to samples taken after fasting for at least 8 hours.   BUN 6 (L) 8 - 23 mg/dL   Creatinine, Ser 0.67 0.44 - 1.00 mg/dL   Calcium 8.6 (L) 8.9 - 10.3 mg/dL   Total Protein 6.8 6.5 - 8.1 g/dL   Albumin 2.7 (L) 3.5 - 5.0 g/dL    AST 21 15 - 41 U/L   ALT 12 0 - 44 U/L   Alkaline Phosphatase 62 38 - 126 U/L   Total Bilirubin 0.8 0.3 - 1.2 mg/dL   GFR, Estimated >60 >60 mL/min    Comment: (NOTE) Calculated using the CKD-EPI Creatinine Equation (2021)    Anion gap 14 5 - 15    Comment: Performed at [facility], [place] [address], [place] [address]  CBC     Status: None   Collection Time: 04/04/22  2:46 PM  Result Value Ref Range   WBC 6.3 4.0 - 10.5 K/uL   RBC 4.29 3.87 - 5.11 MIL/uL   Hemoglobin 13.8 12.0 - 15.0 g/dL   HCT 41.6 36.0 - 46.0 %   MCV 97.0 80.0 - 100.0 fL   MCH 32.2 26.0 - 34.0 pg   MCHC 33.2 30.0 - 36.0 g/dL   RDW 13.6 11.5 - 15.5 %   Platelets 177 150 - 400 K/uL   nRBC 0.0 0.0 - 0.2 %    Comment: Performed at [facility], [place] [address], [place], [place] [address]  Lactic acid, plasma     Status: None   Collection Time: 04/04/22  2:48 PM  Result Value Ref Range   Lactic Acid, Venous 1.4 0.5 - 1.9 mmol/L    Comment: Performed at [facility] [address], [place], [place] [address]  Lactic acid, plasma     Status: None   Collection Time: 04/04/22  6:22 PM  Result Value Ref Range   Lactic Acid, Venous 1.0 0.5 - 1.9 mmol/L    Comment: Performed at [facility] [address] [place], [place] [address]  Urinalysis, Routine w reflex microscopic Urine, Clean Catch     Status: Abnormal   Collection Time: 04/04/22  9:23 PM  Result Value Ref Range   Color, Urine YELLOW YELLOW   APPearance CLEAR CLEAR   Specific Gravity, Urine >1.046 (H) 1.005 - 1.030   pH 6.0 5.0 - 8.0   Glucose, UA NEGATIVE NEGATIVE mg/dL   Hgb urine dipstick NEGATIVE NEGATIVE   Bilirubin Urine NEGATIVE NEGATIVE   Ketones, ur 80 (A) NEGATIVE mg/dL   Protein, ur NEGATIVE NEGATIVE mg/dL   Nitrite NEGATIVE NEGATIVE   Leukocytes,Ua NEGATIVE NEGATIVE    Comment: Performed at [facility] [address] [place], [place] [address]  TSH     Status: Abnormal   Collection Time: 04/04/22  10:22 PM  Result Value Ref Range   TSH 0.033 (L) 0.350 - 4.500 uIU/mL    Comment: Performed by a 3rd Generation assay with a functional sensitivity of <=0.01 uIU/mL. Performed at [facility], [place] [address] [place], [place] [address]   Magnesium     Status: Abnormal   Collection Time: 04/04/22 10:23 PM  Result Value Ref Range   Magnesium 1.1 (L) 1.7 - 2.4 mg/dL    Comment: Performed at [facility] [address], [place], [place] [address]   CT ABDOMEN PELVIS W CONTRAST  Result Date: 04/04/2022 CLINICAL DATA:  Abdominal pain, acute, nonlocalized. Clinical concern for inflammatory bowel disease. EXAM: CT ABDOMEN AND PELVIS WITH CONTRAST TECHNIQUE: Multidetector CT imaging of the abdomen and pelvis was performed using the standard protocol following bolus administration of intravenous contrast. RADIATION DOSE REDUCTION: This exam was performed according to the departmental dose-optimization program which includes automated exposure control, adjustment of the mA and/or kV according to patient size and/or use of iterative reconstruction technique. CONTRAST:  29m OMNIPAQUE IOHEXOL 350 MG/ML SOLN COMPARISON:  03/31/2022. FINDINGS: Lower chest: No acute abnormality.  A small hiatal hernia is noted. Hepatobiliary: No focal liver abnormality is seen. Status post cholecystectomy. No biliary dilatation. Pancreas: Unremarkable. No pancreatic ductal dilatation or surrounding inflammatory changes. Spleen: Normal size.  Calcified granuloma are noted. Adrenals/Urinary Tract: No adrenal nodule or mass. The kidneys enhance symmetrically. No renal calculus or hydronephrosis. The bladder is unremarkable. Stomach/Bowel: There is a small hiatal hernia. The stomach is within normal limits. No bowel obstruction, free air, or pneumatosis. Scattered diverticula are present along the colon. There is bowel wall thickening involving the sigmoid colon and proximal rectum common decreased. There is persistent  pericolonic fat stranding involving the proximal to mid sigmoid colon. No abscess is identified. The appendix is not seen. Vascular/Lymphatic: Aortic atherosclerosis. No enlarged abdominal or pelvic lymph nodes. Reproductive: Status post hysterectomy. No adnexal masses. Other: No abdominopelvic ascites. Fat containing hernia in the ventral abdominal wall in the right upper quadrant. Musculoskeletal: Degenerative changes in the thoracolumbar spine. No acute or suspicious osseous abnormality. IMPRESSION: 1. Decreased bowel wall thickening involving the sigmoid colon and rectum, improved from the prior exam. There are scattered diverticula along the proximal to mid to sigmoid colon with surrounding inflammatory changes suggesting superimposed diverticulitis, unchanged. 2. Aortic atherosclerosis. Electronically Signed   By: LBrett FairyM.D.   On: 04/04/2022 20:24   DG Foot Complete Right  Result Date: 04/04/2022 CLINICAL DATA:  Right foot pain common no known injury, initial encounter EXAM: RIGHT FOOT COMPLETE - 3+ VIEW COMPARISON:  None Available. FINDINGS: Calcaneal spurring is noted. No acute fracture or dislocation is noted. No soft tissue abnormality is seen. Mild tarsal degenerative changes are noted. IMPRESSION: Degenerative change without acute abnormality. Electronically Signed   By: MInez CatalinaM.D.   On: 04/04/2022 19:33    Pending Labs Unresulted Labs (From admission, onward)     Start     Ordered   04/05/22 09509 Basic metabolic panel  Tomorrow morning,   R        04/04/22 2143   04/04/22 1930  C Difficile Quick Screen w PCR reflex  (C Difficile quick screen w PCR reflex panel )  Once, for 24 hours,   URGENT       References:    CDiff Information Tool   04/04/22 1929            Vitals/Pain Today's Vitals  04/04/22 1435 04/04/22 1846 04/04/22 2327 04/05/22 0000  BP:  (!) 148/65 102/60   Pulse:  97 81   Resp:  18 (!) 22   Temp:  98.2 F (36.8 C)  98.1 F (36.7 C)  TempSrc:   Oral  Oral  SpO2:  99% 97%   PainSc: 9        Isolation Precautions Enteric precautions (UV disinfection)  Medications Medications  cefTRIAXone (ROCEPHIN) 1 g in sodium chloride 0.9 % 100 mL IVPB (0 g Intravenous Stopped 04/04/22 2306)  metroNIDAZOLE (FLAGYL) IVPB 500 mg (0 mg Intravenous Stopped 04/04/22 2330)  enoxaparin (LOVENOX) injection 40 mg (40 mg Subcutaneous Given 04/04/22 2218)  0.9 %  sodium chloride infusion ( Intravenous New Bag/Given 04/04/22 2216)  rosuvastatin (CRESTOR) tablet 10 mg (has no administration in time range)  estradiol (ESTRACE) tablet 1 mg (has no administration in time range)  pantoprazole (PROTONIX) EC tablet 40 mg (has no administration in time range)  cholecalciferol (VITAMIN D3) 25 MCG (1000 UNIT) tablet 2,000 Units (has no administration in time range)  polyvinyl alcohol (LIQUIFILM TEARS) 1.4 % ophthalmic solution 1 drop (has no administration in time range)  gabapentin (NEURONTIN) capsule 100 mg (100 mg Oral Given 04/04/22 2328)  levothyroxine (SYNTHROID) tablet 112 mcg (has no administration in time range)  gabapentin (NEURONTIN) capsule 300 mg (300 mg Oral Given 04/04/22 2330)  acetaminophen (TYLENOL) tablet 1,000 mg (1,000 mg Oral Given 04/04/22 2328)  potassium chloride 10 mEq in 100 mL IVPB (0 mEq Intravenous Stopped 04/04/22 2112)  potassium chloride SA (KLOR-CON M) CR tablet 40 mEq (40 mEq Oral Given 04/04/22 1935)  iohexol (OMNIPAQUE) 350 MG/ML injection 75 mL (75 mLs Intravenous Contrast Given 04/04/22 2003)  magnesium sulfate IVPB 2 g 50 mL (0 g Intravenous Stopped 04/05/22 0038)    Mobility walks with device     Focused Assessments         Neuro Assessment: Within Defined Limits      R Recommendations: See Admitting Provider Note  Report given to:   Additional Notes: AO4, ambulatory with cane

## 2022-04-05 NOTE — Progress Notes (Signed)
PROGRESS NOTE    Tracy Hunt  QVZ:[id number] DOB: Oct 22, 1945 DOA: 04/04/2022 PCP: Prince Solian, MD   Brief Narrative:  77 year old with history of HTN, hypothyroidism, Sjogren's syndrome, peripheral neuropathy, Bell's palsy, pelvic organ prolapse comes to the hospital with worsening abdominal pain and diarrhea.  Patient has been following outpatient GI Dr. Benson Norway and Dr. Oren Beckmann at [facility] for longstanding issues with constipation/diarrhea thought to be IBS.  Patient presented to the [facility] ED with similar complaints, CT abdomen pelvis on 1/11 showed possible uncomplicated diverticulitis therefore discharged on Augmentin.  Comes back to the hospital due to continued symptoms.  CT abdomen pelvis during this admission shows decrease in bowel wall thickening.  Dr. Benson Norway was consulted from GI.   Assessment & Plan:  Principal Problem:   Acute diverticulitis Active Problems:   Abdominal pain   Hypothyroidism   HTN (hypertension)   Hypokalemia   Hypocalcemia   Right foot pain   Glaucoma     Assessment and Plan: * Acute diverticulitis -Recently diagnosed at [facility] on January 11 on the CT scan and was prescribed Augmentin.  Repeat CT scan during this admission shows improvement, for now on IV Rocephin and Flagyl. IV fluids, full liquid diet  Abdominal pain She has been extensively evaluated by outpatient GI Dr. Benson Norway and at [facility].  Thought to be IBS.  Recent C. difficile and GI panel was negative.  For now we will continue treatment for acute diverticulitis.  GI has been consulted to further input  Glaucoma -pt to continue her Combigan eye drops BID  Right foot pain Possible plantars fasciitis.  Right foot x-rays negative.  Hypocalcemia/hypokalemia/hypomagnesemia Replete electrolytes aggressively  HTN (hypertension) Recently taken off meds by PCP.  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  Last seen about 2 months ago, gabapentin was increased.  Hypothyroidism TSH low, will check free T4.  Recently outpatient thyroid dose was reduced      DVT prophylaxis: Lovenox Code Status: Full code Family Communication:    Status is: Observation Management per GI team.    Subjective: Still reporting of some abdominal discomfort.  Tolerating IV antibiotics.  No other complaints.  Tells me she is having some mucousy bowel movement.   Examination:  General exam: Appears calm and comfortable  Respiratory system: Clear to auscultation. Respiratory effort normal. Cardiovascular system: S1 & S2 heard, RRR. No JVD, murmurs, rubs, gallops or clicks. No pedal edema. Gastrointestinal system: Abdomen is nondistended, soft and nontender. No organomegaly or masses felt. Normal bowel sounds heard. Central nervous system: Alert and oriented. No focal neurological deficits. Extremities: Symmetric 5 x 5 power. Skin: No rashes, lesions or ulcers Psychiatry: Judgement and insight appear normal. Mood & affect appropriate.     Objective: Vitals:   04/05/22 0000 04/05/22 0228 04/05/22 0304 04/05/22 0326  BP:  (!) 106/56 111/74   Pulse:  71 77   Resp:  16 17   Temp: 98.1 F (36.7 C)  (!) 97.5 F (36.4 C)   TempSrc: Oral  Oral   SpO2:  97% 97%   Weight:    70 kg  Height:    '5\' 3"'$  (1.6 m)    Intake/Output Summary (Last 24 hours) at 04/05/2022 0753 Last data filed at 04/05/2022 0300 Gross per 24 hour  Intake 574.86 ml  Output --  Net 574.86 ml   Filed Weights   04/05/22 0326  Weight: 70 kg  Data Reviewed:   CBC: Recent Labs  Lab 03/31/22 1720 04/04/22 1446  WBC 5.8 6.3  HGB 12.2 13.8  HCT 37.1 41.6  MCV 96.6 97.0  PLT 133* 626   Basic Metabolic Panel: Recent Labs  Lab 03/31/22 1720 04/04/22 1446 04/04/22 2223  NA 138 140  --   K 3.3* 2.7*  --   CL 102 106  --   CO2 26 20*  --   GLUCOSE 120* 80  --   BUN 20 6*  --   CREATININE 0.80  0.67  --   CALCIUM 9.1 8.6*  --   MG  --   --  1.1*   GFR: Estimated Creatinine Clearance: 56.1 mL/min (by C-G formula based on SCr of 0.67 mg/dL). Liver Function Tests: Recent Labs  Lab 03/31/22 1720 04/04/22 1446  AST 21 21  ALT 8 12  ALKPHOS 59 62  BILITOT 0.7 0.8  PROT 6.8 6.8  ALBUMIN 3.2* 2.7*   Recent Labs  Lab 03/31/22 1720 04/04/22 1446  LIPASE 12 28   No results for input(s): "AMMONIA" in the last 168 hours. Coagulation Profile: No results for input(s): "INR", "PROTIME" in the last 168 hours. Cardiac Enzymes: No results for input(s): "CKTOTAL", "CKMB", "CKMBINDEX", "TROPONINI" in the last 168 hours. BNP (last 3 results) No results for input(s): "PROBNP" in the last 8760 hours. HbA1C: No results for input(s): "HGBA1C" in the last 72 hours. CBG: No results for input(s): "GLUCAP" in the last 168 hours. Lipid Profile: No results for input(s): "CHOL", "HDL", "LDLCALC", "TRIG", "CHOLHDL", "LDLDIRECT" in the last 72 hours. Thyroid Function Tests: Recent Labs    04/04/22 2222  TSH 0.033*   Anemia Panel: No results for input(s): "VITAMINB12", "FOLATE", "FERRITIN", "TIBC", "IRON", "RETICCTPCT" in the last 72 hours. Sepsis Labs: Recent Labs  Lab 04/04/22 1448 04/04/22 1822  LATICACIDVEN 1.4 1.0    No results found for this or any previous visit (from the past 240 hour(s)).       Radiology Studies: CT ABDOMEN PELVIS W CONTRAST  Result Date: 04/04/2022 CLINICAL DATA:  Abdominal pain, acute, nonlocalized. Clinical concern for inflammatory bowel disease. EXAM: CT ABDOMEN AND PELVIS WITH CONTRAST TECHNIQUE: Multidetector CT imaging of the abdomen and pelvis was performed using the standard protocol following bolus administration of intravenous contrast. RADIATION DOSE REDUCTION: This exam was performed according to the departmental dose-optimization program which includes automated exposure control, adjustment of the mA and/or kV according to patient size  and/or use of iterative reconstruction technique. CONTRAST:  21m OMNIPAQUE IOHEXOL 350 MG/ML SOLN COMPARISON:  03/31/2022. FINDINGS: Lower chest: No acute abnormality.  A small hiatal hernia is noted. Hepatobiliary: No focal liver abnormality is seen. Status post cholecystectomy. No biliary dilatation. Pancreas: Unremarkable. No pancreatic ductal dilatation or surrounding inflammatory changes. Spleen: Normal size.  Calcified granuloma are noted. Adrenals/Urinary Tract: No adrenal nodule or mass. The kidneys enhance symmetrically. No renal calculus or hydronephrosis. The bladder is unremarkable. Stomach/Bowel: There is a small hiatal hernia. The stomach is within normal limits. No bowel obstruction, free air, or pneumatosis. Scattered diverticula are present along the colon. There is bowel wall thickening involving the sigmoid colon and proximal rectum common decreased. There is persistent pericolonic fat stranding involving the proximal to mid sigmoid colon. No abscess is identified. The appendix is not seen. Vascular/Lymphatic: Aortic atherosclerosis. No enlarged abdominal or pelvic lymph nodes. Reproductive: Status post hysterectomy. No adnexal masses. Other: No abdominopelvic ascites. Fat containing hernia in the ventral abdominal wall in the right upper  quadrant. Musculoskeletal: Degenerative changes in the thoracolumbar spine. No acute or suspicious osseous abnormality. IMPRESSION: 1. Decreased bowel wall thickening involving the sigmoid colon and rectum, improved from the prior exam. There are scattered diverticula along the proximal to mid to sigmoid colon with surrounding inflammatory changes suggesting superimposed diverticulitis, unchanged. 2. Aortic atherosclerosis. Electronically Signed   By: Brett Fairy M.D.   On: 04/04/2022 20:24   DG Foot Complete Right  Result Date: 04/04/2022 CLINICAL DATA:  Right foot pain common no known injury, initial encounter EXAM: RIGHT FOOT COMPLETE - 3+ VIEW  COMPARISON:  None Available. FINDINGS: Calcaneal spurring is noted. No acute fracture or dislocation is noted. No soft tissue abnormality is seen. Mild tarsal degenerative changes are noted. IMPRESSION: Degenerative change without acute abnormality. Electronically Signed   By: Inez Catalina M.D.   On: 04/04/2022 19:33        Scheduled Meds:  acetaminophen  1,000 mg Oral Q8H   cholecalciferol  2,000 Units Oral Daily   enoxaparin (LOVENOX) injection  40 mg Subcutaneous Q24H   estradiol  1 mg Oral Daily   gabapentin  100 mg Oral QHS   gabapentin  300 mg Oral TID   levothyroxine  112 mcg Oral Q0600   pantoprazole  40 mg Oral Daily   rosuvastatin  10 mg Oral QHS   Continuous Infusions:  sodium chloride 75 mL/hr at 04/04/22 2216   cefTRIAXone (ROCEPHIN)  IV Stopped (04/04/22 2306)   magnesium sulfate bolus IVPB     metronidazole Stopped (04/04/22 2330)     LOS: 0 days   Time spent= 35 mins    Tracy Marrufo Arsenio Loader, MD [facility]  If 7PM-7AM, please contact night-coverage  04/05/2022, 7:53 AM

## 2022-04-05 NOTE — ED Notes (Signed)
Per [facility]W, they are ready for pt.

## 2022-04-05 NOTE — Plan of Care (Signed)

## 2022-04-05 NOTE — Consult Note (Signed)
Reason for Consult: Diarrhea, dehydration, and sigmoid diverticulitis Referring Physician: [facility] HPI: This is a 77 year old female who is well-known to me for her complaints of alternating diarrhea and constipation.  In 2022 she was suffering with post prandial diarrhea, but this improved with scheduled Imodium.  It was to the point that Imodium caused constipation and she stopped the medication.  She was well for quite some time until October this year.  Her diarrhea started to be more of an issue and it was persistent, but oddly enough, she would revert back to constipation at times.  Nausea was also a feature of her symptoms and she lost her appetite.  Since October she reports a 34 lbs weight loss.  An attempt was tried with using Xifaxan to treat her for IBS, bu her insurance did not cover the medication.  Because of her reversion, at times, back to constipation EPI was not considered.  A CT scan in October last year was negative for any acute abnormalities and prior work up for her symptoms, but they were much milder in 2021, was negative with an EGD/colonoscopy.  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  The fecal elastase was low and her fecal calprotectin was elevated.  On 03/31/2022 she presented to the ER and she was diagnosed with an acute diverticulitis.  Augmentin was prescribed, but she returned on 04/04/2022.  She was hypokalemic and feeling poorly.  IV hydration and electrolyte replacement markedly improved her clinical status.  The repeat CT scan showed that her diverticulitis was improving.  Since her admission she denies any issues with diarrhea, but she complains about passing mucus.  Just prior to her hospitalization she had two episodes of diarrheal fecal incontinence.  Past Medical History:  Diagnosis Date   Anemia    Arthritis    Bell's palsy 4627    Complication of anesthesia    Hard to wake up after a  colonoscopy before  around 2000   Dry eye    Family history of adverse reaction to anesthesia    mom hard to wake up and had lethargy after anesthesia   Glaucoma    Hepatitis 1986   secondary to varicella was in hospital for 2 days   Hypertension    Hypothyroidism    Malaria    as as child   Mumps    PONV (postoperative nausea and vomiting)    Pre-diabetes    Rosacea    Scoliosis    Sjogren's syndrome ([place])     Past Surgical History:  Procedure Laterality Date   [place] Bilateral    cataract   JOINT REPLACEMENT  2012   Left knee   JOINT REPLACEMENT     right knee 2017   REVERSE SHOULDER ARTHROPLASTY Left 10/23/2020   Procedure: REVERSE SHOULDER ARTHROPLASTY;  Surgeon: Netta Cedars, MD;  Location: [facility];  Service: Orthopedics;  Laterality: Left;  with ISB   steroid injections     L3,L4, L5    Family History  Problem Relation Age of Onset   Colon cancer Mother    Lung cancer Mother    Hypertension Mother    Diabetes Father    Breast cancer Paternal Grandmother     Social History:  reports that she has never smoked. She  has never used smokeless tobacco. She reports that she does not currently use alcohol. She reports that she does not use drugs.  Allergies:  Allergies  Allergen Reactions   Other Itching    Adhesive tape    Oyster Shell Hives   Shellfish Allergy Nausea And Vomiting    Oysters    Medications: Scheduled:  cholecalciferol  2,000 Units Oral Daily   enoxaparin (LOVENOX) injection  40 mg Subcutaneous Q24H   estradiol  1 mg Oral Daily   gabapentin  300 mg Oral Q1500   gabapentin  400 mg Oral BID   levothyroxine  112 mcg Oral Q0600   lipase/protease/amylase  72,000 Units Oral TID WC   pantoprazole  40 mg Oral Daily   rosuvastatin  10 mg Oral QHS   Continuous:  sodium chloride 75 mL/hr at 04/05/22 1018    cefTRIAXone (ROCEPHIN)  IV     metronidazole 500 mg (04/05/22 8099)    Results for orders placed or performed during the hospital encounter of 04/04/22 (from the past 24 hour(s))  Lactic acid, plasma     Status: None   Collection Time: 04/04/22  6:22 PM  Result Value Ref Range   Lactic Acid, Venous 1.0 0.5 - 1.9 mmol/L  Urinalysis, Routine w reflex microscopic Urine, Clean Catch     Status: Abnormal   Collection Time: 04/04/22  9:23 PM  Result Value Ref Range   Color, Urine YELLOW YELLOW   APPearance CLEAR CLEAR   Specific Gravity, Urine >1.046 (H) 1.005 - 1.030   pH 6.0 5.0 - 8.0   Glucose, UA NEGATIVE NEGATIVE mg/dL   Hgb urine dipstick NEGATIVE NEGATIVE   Bilirubin Urine NEGATIVE NEGATIVE   Ketones, ur 80 (A) NEGATIVE mg/dL   Protein, ur NEGATIVE NEGATIVE mg/dL   Nitrite NEGATIVE NEGATIVE   Leukocytes,Ua NEGATIVE NEGATIVE  TSH     Status: Abnormal   Collection Time: 04/04/22 10:22 PM  Result Value Ref Range   TSH 0.033 (L) 0.350 - 4.500 uIU/mL  Magnesium     Status: Abnormal   Collection Time: 04/04/22 10:23 PM  Result Value Ref Range   Magnesium 1.1 (L) 1.7 - 2.4 mg/dL  Phosphorus     Status: None   Collection Time: 04/05/22  7:34 AM  Result Value Ref Range   Phosphorus 2.8 2.5 - 4.6 mg/dL  Basic metabolic panel     Status: Abnormal   Collection Time: 04/05/22  7:34 AM  Result Value Ref Range   Sodium 139 135 - 145 mmol/L   Potassium 3.1 (L) 3.5 - 5.1 mmol/L   Chloride 107 98 - 111 mmol/L   CO2 19 (L) 22 - 32 mmol/L   Glucose, Bld 69 (L) 70 - 99 mg/dL   BUN 6 (L) 8 - 23 mg/dL   Creatinine, Ser 0.68 0.44 - 1.00 mg/dL   Calcium 8.1 (L) 8.9 - 10.3 mg/dL   GFR, Estimated >60 >60 mL/min   Anion gap 13 5 - 15  Magnesium     Status: Abnormal   Collection Time: 04/05/22  7:34 AM  Result Value Ref Range   Magnesium 1.5 (L) 1.7 - 2.4 mg/dL     CT ABDOMEN PELVIS W CONTRAST  Result Date: 04/04/2022 CLINICAL DATA:  Abdominal pain, acute, nonlocalized. Clinical concern  for inflammatory bowel disease. EXAM: CT ABDOMEN AND PELVIS WITH CONTRAST TECHNIQUE: Multidetector CT imaging of the abdomen and pelvis was performed using the standard protocol following bolus administration of intravenous contrast. RADIATION DOSE REDUCTION: This  exam was performed according to the departmental dose-optimization program which includes automated exposure control, adjustment of the mA and/or kV according to patient size and/or use of iterative reconstruction technique. CONTRAST:  28m OMNIPAQUE IOHEXOL 350 MG/ML SOLN COMPARISON:  03/31/2022. FINDINGS: Lower chest: No acute abnormality.  A small hiatal hernia is noted. Hepatobiliary: No focal liver abnormality is seen. Status post cholecystectomy. No biliary dilatation. Pancreas: Unremarkable. No pancreatic ductal dilatation or surrounding inflammatory changes. Spleen: Normal size.  Calcified granuloma are noted. Adrenals/Urinary Tract: No adrenal nodule or mass. The kidneys enhance symmetrically. No renal calculus or hydronephrosis. The bladder is unremarkable. Stomach/Bowel: There is a small hiatal hernia. The stomach is within normal limits. No bowel obstruction, free air, or pneumatosis. Scattered diverticula are present along the colon. There is bowel wall thickening involving the sigmoid colon and proximal rectum common decreased. There is persistent pericolonic fat stranding involving the proximal to mid sigmoid colon. No abscess is identified. The appendix is not seen. Vascular/Lymphatic: Aortic atherosclerosis. No enlarged abdominal or pelvic lymph nodes. Reproductive: Status post hysterectomy. No adnexal masses. Other: No abdominopelvic ascites. Fat containing hernia in the ventral abdominal wall in the right upper quadrant. Musculoskeletal: Degenerative changes in the thoracolumbar spine. No acute or suspicious osseous abnormality. IMPRESSION: 1. Decreased bowel wall thickening involving the sigmoid colon and rectum, improved from the prior  exam. There are scattered diverticula along the proximal to mid to sigmoid colon with surrounding inflammatory changes suggesting superimposed diverticulitis, unchanged. 2. Aortic atherosclerosis. Electronically Signed   By: LBrett FairyM.D.   On: 04/04/2022 20:24   DG Foot Complete Right  Result Date: 04/04/2022 CLINICAL DATA:  Right foot pain common no known injury, initial encounter EXAM: RIGHT FOOT COMPLETE - 3+ VIEW COMPARISON:  None Available. FINDINGS: Calcaneal spurring is noted. No acute fracture or dislocation is noted. No soft tissue abnormality is seen. Mild tarsal degenerative changes are noted. IMPRESSION: Degenerative change without acute abnormality. Electronically Signed   By: MInez CatalinaM.D.   On: 04/04/2022 19:33    ROS:  As stated above in the HPI otherwise negative.  Blood pressure 112/63, pulse 80, temperature 97.9 F (36.6 C), temperature source Oral, resp. rate 16, height '5\' 3"'$  (1.6 m), weight 70 kg, SpO2 98 %.    PE: Gen: NAD, Alert and Oriented HEENT:  [place]/AT, EOMI Neck: Supple, no LAD Lungs: CTA Bilaterally CV: RRR without M/G/R ABD: Soft, NTND, +BS Ext: No C/C/E  Assessment/Plan: 1) EPI. 2) Diarrhea. 3) Diverticulitis.   It is clear that she has a diverticulitis and she is on appropriate treatment.  The examination did not show that she was severely tender with palpation.  On a clear liquid diet she denies any diarrhea, but her fecal elastase was low.  Prior to this finding it was difficult to discern the state of her diarrhea as she did report problems with constipation.  Regardless, she will be started on Creon.  Hopefully this can start to improve her clinical status.  Plan: 1) Creon 36,000 units, two capsules TID with meals. 2) Continue with CTX and metronidazole. 3) Advance diet as tolerated. 4) Dr. MCollene Mareswill round on her tomorrow.  Sabah Zucco D 04/05/2022, 4:12 PM

## 2022-04-06 DIAGNOSIS — K5792 Diverticulitis of intestine, part unspecified, without perforation or abscess without bleeding: Secondary | ICD-10-CM | POA: Diagnosis not present

## 2022-04-06 LAB — CBC
HCT: 33.3 % — ABNORMAL LOW (ref 36.0–46.0)
Hemoglobin: 11.8 g/dL — ABNORMAL LOW (ref 12.0–15.0)
MCH: 32.7 pg (ref 26.0–34.0)
MCHC: 35.4 g/dL (ref 30.0–36.0)
MCV: 92.2 fL (ref 80.0–100.0)
Platelets: 154 10*3/uL (ref 150–400)
RBC: 3.61 MIL/uL — ABNORMAL LOW (ref 3.87–5.11)
RDW: 13.8 % (ref 11.5–15.5)
WBC: 5 10*3/uL (ref 4.0–10.5)
nRBC: 0 % (ref 0.0–0.2)

## 2022-04-06 LAB — T4, FREE: Free T4: 1.44 ng/dL — ABNORMAL HIGH (ref 0.61–1.12)

## 2022-04-06 LAB — BASIC METABOLIC PANEL
Anion gap: 10 (ref 5–15)
BUN: <5 mg/dL — ABNORMAL LOW (ref 8–23)
CO2: 19 mmol/L — ABNORMAL LOW (ref 22–32)
Calcium: 8.1 mg/dL — ABNORMAL LOW (ref 8.9–10.3)
Chloride: 109 mmol/L (ref 98–111)
Creatinine, Ser: 0.68 mg/dL (ref 0.44–1.00)
GFR, Estimated: >60 mL/min (ref >60)
Glucose, Bld: 75 mg/dL (ref 70–99)
Potassium: 2.9 mmol/L — ABNORMAL LOW (ref 3.5–5.1)
Sodium: 138 mmol/L (ref 135–145)

## 2022-04-06 LAB — SEDIMENTATION RATE: Sed Rate: 24 mm/hr — ABNORMAL HIGH (ref 0–22)

## 2022-04-06 LAB — C-REACTIVE PROTEIN: CRP: 0.7 mg/dL (ref <1.0)

## 2022-04-06 LAB — MAGNESIUM: Magnesium: 1.5 mg/dL — ABNORMAL LOW (ref 1.7–2.4)

## 2022-04-06 LAB — URIC ACID: Uric Acid, Serum: 7.8 mg/dL — ABNORMAL HIGH (ref 2.5–7.1)

## 2022-04-06 MED ORDER — MAGNESIUM SULFATE 4 GM/100ML IV SOLN
4.0000 g | Freq: Once | INTRAVENOUS | Status: AC
  Administered 2022-04-06: 4 g via INTRAVENOUS
  Filled 2022-04-06: qty 100

## 2022-04-06 MED ORDER — COLCHICINE 0.6 MG PO TABS
0.6000 mg | ORAL_TABLET | Freq: Once | ORAL | Status: AC
  Administered 2022-04-06: 0.6 mg via ORAL
  Filled 2022-04-06: qty 1

## 2022-04-06 MED ORDER — POTASSIUM CHLORIDE CRYS ER 20 MEQ PO TBCR
40.0000 meq | EXTENDED_RELEASE_TABLET | Freq: Three times a day (TID) | ORAL | Status: AC
  Administered 2022-04-06 – 2022-04-07 (×4): 40 meq via ORAL
  Filled 2022-04-06 (×4): qty 2

## 2022-04-06 MED ORDER — SODIUM CHLORIDE 0.9 % IV SOLN: INTRAVENOUS | Status: DC

## 2022-04-06 NOTE — Progress Notes (Signed)
PROGRESS NOTE    Tracy Hunt  TZG:[id number] DOB: Aug 11, 1945 DOA: 04/04/2022 PCP: Prince Solian, MD    Brief Narrative:  77 year old with history of HTN, hypothyroidism, Sjogren's syndrome, peripheral neuropathy, Bell's pals who presented to the emergency room with abdominal pain, ongoing diarrhea.  Recently diagnosed with uncomplicated diverticulitis in the office and discharged on Augmentin.  Her main complaint is feeling weak, leg swelling.  Admitted and followed by GI.    Assessment & Plan:   Acute uncomplicated diverticulitis: Already improving.  Minimal GI symptoms. Currently on full liquid diet-advance to regular diet.  Continue IV Rocephin and Flagyl today.  Continue IV fluids.  Gastroenterology following.  Anticipate discharge on oral antibiotics if successful to challenge with regular diet. C. difficile antigen positive, toxin is negative.  Currently no indication to treat for C. difficile.  Electrolytes Persistent hypokalemia Persistent hypomagnesemia, replace aggressively today with IV and oral replacement.  Recheck levels tomorrow morning.  Right foot pain: Patient has diffuse pain and swelling of both feet. Uric acid is minimally elevated.  ESR and CRP are normal.  Will give 1 dose of colchicine to see the response.  Symptomatic treatment.  Hypothyroidism: On Synthroid.   DVT prophylaxis: enoxaparin (LOVENOX) injection 40 mg Start: 04/04/22 2200   Code Status: Full code Family Communication: None at bedside Disposition Plan: Status is: Inpatient Remains inpatient appropriate because: Diet challenge and tolerance.     Consultants:  Gastroenterology  Procedures:  None  Antimicrobials:  Rocephin and Flagyl 1/15---   Subjective: Patient seen and examined.  Denies any nausea vomiting or abdominal pain.  She is tolerating full liquid diet.  Still having multiple mucoid small-volume stools but without any abdominal pain or cramps. Her main concern  is foot swelling which is painful.  Afebrile.  Objective: Vitals:   04/05/22 1633 04/05/22 1931 04/06/22 0359 04/06/22 0806  BP: 115/60 (!) 142/77 116/62 130/68  Pulse: 74 88 75 86  Resp: '17 16  17  '$ Temp: 98.7 F (37.1 C) 99.3 F (37.4 C) 98.8 F (37.1 C) 98.6 F (37 C)  TempSrc: Oral Oral Oral Oral  SpO2: 99% 100% 97% 96%  Weight:      Height:        Intake/Output Summary (Last 24 hours) at 04/06/2022 1424 Last data filed at 04/06/2022 7591 Gross per 24 hour  Intake 1528.55 ml  Output --  Net 1528.55 ml   Filed Weights   04/05/22 0326  Weight: 70 kg    Examination:  General exam: Appears calm and comfortable  Respiratory system: Clear to auscultation. Respiratory effort normal. Cardiovascular system: S1 & S2 heard, RRR.  Gastrointestinal system: Soft.  Nontender.  Bowel sound present. Central nervous system: Alert and oriented. No focal neurological deficits. Extremities: Symmetric 5 x 5 power. Skin:  Bilateral swelling of the dorsum of the feet, no erythema or redness.  Minimal hyperesthesia.  Joints are without any swelling or tenderness.    Data Reviewed: I have personally reviewed following labs and imaging studies  CBC: Recent Labs  Lab 03/31/22 1720 04/04/22 1446 04/06/22 0716  WBC 5.8 6.3 5.0  HGB 12.2 13.8 11.8*  HCT 37.1 41.6 33.3*  MCV 96.6 97.0 92.2  PLT 133* 177 638   Basic Metabolic Panel: Recent Labs  Lab 03/31/22 1720 04/04/22 1446 04/04/22 2223 04/05/22 0734 04/06/22 0716  NA 138 140  --  139 138  K 3.3* 2.7*  --  3.1* 2.9*  CL 102 106  --  107 109  CO2 26 20*  --  19* 19*  GLUCOSE 120* 80  --  69* 75  BUN 20 6*  --  6* <5*  CREATININE 0.80 0.67  --  0.68 0.68  CALCIUM 9.1 8.6*  --  8.1* 8.1*  MG  --   --  1.1* 1.5* 1.5*  PHOS  --   --   --  2.8  --    GFR: Estimated Creatinine Clearance: 56.1 mL/min (by C-G formula based on SCr of 0.68 mg/dL). Liver Function Tests: Recent Labs  Lab 03/31/22 1720 04/04/22 1446  AST 21  21  ALT 8 12  ALKPHOS 59 62  BILITOT 0.7 0.8  PROT 6.8 6.8  ALBUMIN 3.2* 2.7*   Recent Labs  Lab 03/31/22 1720 04/04/22 1446  LIPASE 12 28   No results for input(s): "AMMONIA" in the last 168 hours. Coagulation Profile: No results for input(s): "INR", "PROTIME" in the last 168 hours. Cardiac Enzymes: No results for input(s): "CKTOTAL", "CKMB", "CKMBINDEX", "TROPONINI" in the last 168 hours. BNP (last 3 results) No results for input(s): "PROBNP" in the last 8760 hours. HbA1C: No results for input(s): "HGBA1C" in the last 72 hours. CBG: No results for input(s): "GLUCAP" in the last 168 hours. Lipid Profile: No results for input(s): "CHOL", "HDL", "LDLCALC", "TRIG", "CHOLHDL", "LDLDIRECT" in the last 72 hours. Thyroid Function Tests: Recent Labs    04/04/22 2222 04/06/22 0716  TSH 0.033*  --   FREET4  --  1.44*   Anemia Panel: No results for input(s): "VITAMINB12", "FOLATE", "FERRITIN", "TIBC", "IRON", "RETICCTPCT" in the last 72 hours. Sepsis Labs: Recent Labs  Lab 04/04/22 1448 04/04/22 1822  LATICACIDVEN 1.4 1.0    Recent Results (from the past 240 hour(s))  C Difficile Quick Screen w PCR reflex     Status: Abnormal   Collection Time: 04/04/22  7:30 PM   Specimen: STOOL  Result Value Ref Range Status   C Diff antigen POSITIVE (A) NEGATIVE Final   C Diff toxin NEGATIVE NEGATIVE Final   C Diff interpretation Results are indeterminate. See PCR results.  Final    Comment: Performed at [facility], [place] [address] [place], [place] [address]  C. Diff by PCR, Reflexed     Status: Abnormal   Collection Time: 04/04/22  7:30 PM  Result Value Ref Range Status   Toxigenic C. Difficile by PCR POSITIVE (A) NEGATIVE Final    Comment: Positive for toxigenic C. difficile with little to no toxin production. Only treat if clinical presentation suggests symptomatic illness. Performed at [facility], [place] [address], [place],  [address]           Radiology Studies: CT ABDOMEN PELVIS W CONTRAST  Result Date: 04/04/2022 CLINICAL DATA:  Abdominal pain, acute, nonlocalized. Clinical concern for inflammatory bowel disease. EXAM: CT ABDOMEN AND PELVIS WITH CONTRAST TECHNIQUE: Multidetector CT imaging of the abdomen and pelvis was performed using the standard protocol following bolus administration of intravenous contrast. RADIATION DOSE REDUCTION: This exam was performed according to the departmental dose-optimization program which includes automated exposure control, adjustment of the mA and/or kV according to patient size and/or use of iterative reconstruction technique. CONTRAST:  63m OMNIPAQUE IOHEXOL 350 MG/ML SOLN COMPARISON:  03/31/2022. FINDINGS: Lower chest: No acute abnormality.  A small hiatal hernia is noted. Hepatobiliary: No focal liver abnormality is seen. Status post cholecystectomy. No biliary dilatation. Pancreas: Unremarkable. No pancreatic ductal dilatation or surrounding inflammatory changes. Spleen: Normal size.  Calcified granuloma are noted.  Adrenals/Urinary Tract: No adrenal nodule or mass. The kidneys enhance symmetrically. No renal calculus or hydronephrosis. The bladder is unremarkable. Stomach/Bowel: There is a small hiatal hernia. The stomach is within normal limits. No bowel obstruction, free air, or pneumatosis. Scattered diverticula are present along the colon. There is bowel wall thickening involving the sigmoid colon and proximal rectum common decreased. There is persistent pericolonic fat stranding involving the proximal to mid sigmoid colon. No abscess is identified. The appendix is not seen. Vascular/Lymphatic: Aortic atherosclerosis. No enlarged abdominal or pelvic lymph nodes. Reproductive: Status post hysterectomy. No adnexal masses. Other: No abdominopelvic ascites. Fat containing hernia in the ventral abdominal wall in the right upper quadrant. Musculoskeletal: Degenerative changes in the thoracolumbar  spine. No acute or suspicious osseous abnormality. IMPRESSION: 1. Decreased bowel wall thickening involving the sigmoid colon and rectum, improved from the prior exam. There are scattered diverticula along the proximal to mid to sigmoid colon with surrounding inflammatory changes suggesting superimposed diverticulitis, unchanged. 2. Aortic atherosclerosis. Electronically Signed   By: Brett Fairy M.D.   On: 04/04/2022 20:24   DG Foot Complete Right  Result Date: 04/04/2022 CLINICAL DATA:  Right foot pain common no known injury, initial encounter EXAM: RIGHT FOOT COMPLETE - 3+ VIEW COMPARISON:  None Available. FINDINGS: Calcaneal spurring is noted. No acute fracture or dislocation is noted. No soft tissue abnormality is seen. Mild tarsal degenerative changes are noted. IMPRESSION: Degenerative change without acute abnormality. Electronically Signed   By: Inez Catalina M.D.   On: 04/04/2022 19:33        Scheduled Meds:  cholecalciferol  2,000 Units Oral Daily   enoxaparin (LOVENOX) injection  40 mg Subcutaneous Q24H   estradiol  1 mg Oral Daily   gabapentin  300 mg Oral Q1500   gabapentin  400 mg Oral BID   levothyroxine  112 mcg Oral Q0600   lipase/protease/amylase  72,000 Units Oral TID WC   pantoprazole  40 mg Oral Daily   potassium chloride  40 mEq Oral TID   rosuvastatin  10 mg Oral QHS   Continuous Infusions:  sodium chloride 75 mL/hr at 04/06/22 1205   cefTRIAXone (ROCEPHIN)  IV 2 g (04/05/22 2119)   magnesium sulfate bolus IVPB 4 g (04/06/22 1315)   metronidazole 500 mg (04/06/22 1052)     LOS: 1 day    Time spent: 35 minutes    Barb Merino, MD [facility] Pager [phone]

## 2022-04-06 NOTE — Progress Notes (Addendum)
Subjective: Patient seems to be doing slightly better today.  She denies having any abdominal pain but she has had 2 loose BMs today.  I had a long lengthy discussion with her daughter who is at her bedside. She had a history of constipation alternating with diarrhea for several years but over the last 4 months she has had worsening of her symptoms where she can go up to a week without having a bowel movement followed by several loose stools for couple of days.  There is no history of hematochezia or melena.  She has noticed large amount of mucus in the stool.  She was referred to [facility] and saw Dr. Oren Beckmann their who apparently checked a fecal elastase on her that low consistent with EPI.  She has been started on PERT yesterday.  She had presented to the emergency room with generalized weakness and fatigue and was unable to stand up after several loose BMs and a CT of the abdomen pelvis revealed scattered diverticula in the proximal to mid sigmoid colon with surrounding inflammatory changes and aortic atherosclerosis. She is currently on Ceftin and Flagyl.  She recently completed a course of Augmentin as well.  She was found to be positive for C. difficile antigen for the C. difficile toxin was negative. She has been hypokalemic along with a low magnesium which are being replenished now.  She also developed gout in her right toe and has been given colchicine.  She has lost about 40 pounds over the last 4 months and claims her hypothyroidism is being managed by Dr. Dagmar Hait her PCP.   Objective: Vital signs in last 24 hours: Temp:  [98.6 F (37 C)-99.3 F (37.4 C)] 98.6 F (37 C) (01/17 0806) Pulse Rate:  [74-88] 86 (01/17 0806) Resp:  [16-17] 17 (01/17 0806) BP: (115-142)/(60-77) 130/68 (01/17 0806) SpO2:  [96 %-100 %] 96 % (01/17 0806) Last BM Date : 04/05/22  Intake/Output from previous day: 01/16 0701 - 01/17 0700 In: 2148.6 [I.V.:1752.9; IV Piggyback:395.6] Out: -  Intake/Output this  shift: No intake/output data recorded.  General appearance: alert, cooperative, and no distress Resp: clear to auscultation bilaterally Cardio: regular rate and rhythm, S1, S2 normal, no murmur, click, rub or gallop GI: soft, non-tender; bowel sounds normal; no masses,  no organomegaly  Lab Results: Recent Labs    04/04/22 1446 04/06/22 0716  WBC 6.3 5.0  HGB 13.8 11.8*  HCT 41.6 33.3*  PLT 177 154   BMET Recent Labs    04/04/22 1446 04/05/22 0734 04/06/22 0716  NA 140 139 138  K 2.7* 3.1* 2.9*  CL 106 107 109  CO2 20* 19* 19*  GLUCOSE 80 69* 75  BUN 6* 6* <5*  CREATININE 0.67 0.68 0.68  CALCIUM 8.6* 8.1* 8.1*   LFT Recent Labs    04/04/22 1446  PROT 6.8  ALBUMIN 2.7*  AST 21  ALT 12  ALKPHOS 62  BILITOT 0.8   C-Diff Recent Labs    04/04/22 1930  CDIFFTOX NEGATIVE   Recent Labs    04/04/22 1930  CDIFFPCR POSITIVE*   Fecal Lactopherrin No results for input(s): "FECLLACTOFRN" in the last 72 hours.  Studies/Results: CT ABDOMEN PELVIS W CONTRAST  Result Date: 04/04/2022 CLINICAL DATA:  Abdominal pain, acute, nonlocalized. Clinical concern for inflammatory bowel disease. EXAM: CT ABDOMEN AND PELVIS WITH CONTRAST TECHNIQUE: Multidetector CT imaging of the abdomen and pelvis was performed using the standard protocol following bolus administration of intravenous contrast. RADIATION DOSE REDUCTION: This exam  was performed according to the departmental dose-optimization program which includes automated exposure control, adjustment of the mA and/or kV according to patient size and/or use of iterative reconstruction technique. CONTRAST:  11m OMNIPAQUE IOHEXOL 350 MG/ML SOLN COMPARISON:  03/31/2022. FINDINGS: Lower chest: No acute abnormality.  A small hiatal hernia is noted. Hepatobiliary: No focal liver abnormality is seen. Status post cholecystectomy. No biliary dilatation. Pancreas: Unremarkable. No pancreatic ductal dilatation or surrounding inflammatory changes.  Spleen: Normal size.  Calcified granuloma are noted. Adrenals/Urinary Tract: No adrenal nodule or mass. The kidneys enhance symmetrically. No renal calculus or hydronephrosis. The bladder is unremarkable. Stomach/Bowel: There is a small hiatal hernia. The stomach is within normal limits. No bowel obstruction, free air, or pneumatosis. Scattered diverticula are present along the colon. There is bowel wall thickening involving the sigmoid colon and proximal rectum common decreased. There is persistent pericolonic fat stranding involving the proximal to mid sigmoid colon. No abscess is identified. The appendix is not seen. Vascular/Lymphatic: Aortic atherosclerosis. No enlarged abdominal or pelvic lymph nodes. Reproductive: Status post hysterectomy. No adnexal masses. Other: No abdominopelvic ascites. Fat containing hernia in the ventral abdominal wall in the right upper quadrant. Musculoskeletal: Degenerative changes in the thoracolumbar spine. No acute or suspicious osseous abnormality. IMPRESSION: 1. Decreased bowel wall thickening involving the sigmoid colon and rectum, improved from the prior exam. There are scattered diverticula along the proximal to mid to sigmoid colon with surrounding inflammatory changes suggesting superimposed diverticulitis, unchanged. 2. Aortic atherosclerosis. Electronically Signed   By: LBrett FairyM.D.   On: 04/04/2022 20:24   DG Foot Complete Right  Result Date: 04/04/2022 CLINICAL DATA:  Right foot pain common no known injury, initial encounter EXAM: RIGHT FOOT COMPLETE - 3+ VIEW COMPARISON:  None Available. FINDINGS: Calcaneal spurring is noted. No acute fracture or dislocation is noted. No soft tissue abnormality is seen. Mild tarsal degenerative changes are noted. IMPRESSION: Degenerative change without acute abnormality. Electronically Signed   By: MInez CatalinaM.D.   On: 04/04/2022 19:33    Medications: I have reviewed the patient's current medications. Prior to  Admission:  Facility-Administered Medications Prior to Admission  Medication Dose Route Frequency Provider Last Rate Last Admin   psyllium (HYDROCIL/METAMUCIL) 1 packet  1 packet Oral Daily ZBerton Mount NP       Medications Prior to Admission  Medication Sig Dispense Refill Last Dose   acetaminophen (TYLENOL) 650 MG CR tablet Take 1,300 mg by mouth in the morning, at noon, and at bedtime. Takes 2 tablets in the morning, 2 in the afternoon, and 2 at bedtime.   04/03/2022   amoxicillin-clavulanate (AUGMENTIN) 875-125 MG tablet Take 1 tablet by mouth every 12 (twelve) hours. 14 tablet 0 12/70/6237  bismuth subsalicylate (PEPTO BISMOL) 262 MG chewable tablet Chew 524 mg by mouth as needed for indigestion or diarrhea or loose stools.   unk   brimonidine-timolol (COMBIGAN) 0.2-0.5 % ophthalmic solution Place 1 drop into both eyes every 12 (twelve) hours.   04/04/2022   Carboxymethylcellulose Sodium 1 % GEL Place 1 drop into both eyes at bedtime.   04/03/2022   Cholecalciferol (VITAMIN D) 50 MCG (2000 UT) tablet Take 2,000 Units by mouth daily.   04/03/2022   Cobalamin Combinations (B-12) 1000-400 MCG SUBL Place 1 tablet under the tongue daily.   04/03/2022   estradiol (ESTRACE) 1 MG tablet Take 1 mg by mouth daily.   04/03/2022   gabapentin (NEURONTIN) 100 MG capsule Take 1 tablet in the morning and  bedtime along with '300mg'$  tablets. (Patient taking differently: Take 100 mg by mouth at bedtime.) 180 capsule 3 04/03/2022   gabapentin (NEURONTIN) 300 MG capsule Take 1 capsule (300 mg total) by mouth 3 (three) times daily. 480 capsule 1 04/03/2022   glycerin adult 2 g suppository Place 1 suppository rectally as needed for constipation. 12 suppository 0 unk   hydroxypropyl methylcellulose / hypromellose (ISOPTO TEARS / GONIOVISC) 2.5 % ophthalmic solution Place 1 drop into both eyes daily.   04/02/2022   levothyroxine (SYNTHROID) 112 MCG tablet Take 112 mcg by mouth daily.   04/02/2022   Menthol, Topical  Analgesic, (BIOFREEZE EX) Apply 1 drop topically daily as needed (back pain).   Past Month   Multiple Vitamins-Minerals (CENTRUM SILVER 50+WOMEN PO) Take 1 tablet by mouth daily.   Past Week   omeprazole (PRILOSEC) 20 MG capsule Take 20 mg by mouth daily.   04/02/2022   Propylene Glycol (SYSTANE COMPLETE OP) Place 1 drop into both eyes 2 (two) times daily as needed (dry eyes).   04/02/2022   rosuvastatin (CRESTOR) 10 MG tablet Take 10 mg by mouth at bedtime.   04/02/2022   simethicone (MYLICON) 562 MG chewable tablet Chew 125 mg by mouth every 6 (six) hours as needed for flatulence.   unk   psyllium (METAMUCIL) 58.6 % packet Take 1 packet by mouth daily as needed (constipation).      traMADol (ULTRAM) 50 MG tablet Take 1 tablet (50 mg total) by mouth every 6 (six) hours as needed for moderate pain or severe pain. (Patient not taking: Reported on 04/05/2022) 30 tablet 0 Not Taking   Scheduled:  cholecalciferol  2,000 Units Oral Daily   enoxaparin (LOVENOX) injection  40 mg Subcutaneous Q24H   estradiol  1 mg Oral Daily   gabapentin  300 mg Oral Q1500   gabapentin  400 mg Oral BID   levothyroxine  112 mcg Oral Q0600   lipase/protease/amylase  72,000 Units Oral TID WC   pantoprazole  40 mg Oral Daily   potassium chloride  40 mEq Oral TID   rosuvastatin  10 mg Oral QHS   Continuous:  sodium chloride 75 mL/hr at 04/06/22 1205   cefTRIAXone (ROCEPHIN)  IV 2 g (04/05/22 2119)   metronidazole 500 mg (04/06/22 1052)   ZHY:QMVHQIONGEXBM, guaiFENesin, hydrALAZINE, ipratropium-albuterol, metoprolol tartrate, ondansetron (ZOFRAN) IV, polyvinyl alcohol, senna-docusate, traMADol  Assessment/Plan: 1) Acute uncomplicated diverticulitis-improved significantly with antibiotics. Agree with advancing her diet. 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 3) ?IBS-concerning history of diarrhea alternating with constipation; high-fiber diet was discussed with the patient-to be resumed once acute symptoms resolve. 4)  Hypomagnesemia/hypokalemia-being replaced. 5) Gout on colchicine. 6) Hypothyroidism on Synthroid TSH low.Marland Kitchen 7) HTN. 8) Sjogren's syndrome. 9) Abnormal weight loss-etiology unclear ?EPI.  .  LOS: 1 day   Juanita Craver 04/06/2022, 12:50 PM

## 2022-04-06 NOTE — Plan of Care (Signed)
  Problem: Education: ?Goal: Knowledge of General Education information will improve ?Description: Including pain rating scale, medication(s)/side effects and non-pharmacologic comfort measures ?Outcome: Progressing ?  ?Problem: Health Behavior/Discharge Planning: ?Goal: Ability to manage health-related needs will improve ?Outcome: Progressing ?  ?Problem: Clinical Measurements: ?Goal: Ability to maintain clinical measurements within normal limits will improve ?Outcome: Progressing ?Goal: Diagnostic test results will improve ?Outcome: Progressing ?Goal: Cardiovascular complication will be avoided ?Outcome: Progressing ?  ?

## 2022-04-06 NOTE — Progress Notes (Signed)
Mobility Specialist Progress Note:   04/06/22 1037  Mobility  Activity Ambulated with assistance in room  Level of Assistance Standby assist, set-up cues, supervision of patient - no hands on  Assistive Device Front wheel walker  Distance Ambulated (ft) 80 ft  Activity Response Tolerated well  Mobility Referral Yes  $Mobility charge 1 Mobility   Pt received sitting EOB and eager to ambulate. No complaints. Had BM, independent with pericare. Pt returned sitting EOB with all needs met and call bell in reach.   Andrey Campanile Mobility Specialist Please contact via SecureChat or  Rehab office at [phone]

## 2022-04-07 DIAGNOSIS — K5792 Diverticulitis of intestine, part unspecified, without perforation or abscess without bleeding: Secondary | ICD-10-CM | POA: Diagnosis not present

## 2022-04-07 LAB — PHOSPHORUS: Phosphorus: 2.1 mg/dL — ABNORMAL LOW (ref 2.5–4.6)

## 2022-04-07 LAB — BASIC METABOLIC PANEL
Anion gap: 6 (ref 5–15)
BUN: <5 mg/dL — ABNORMAL LOW (ref 8–23)
CO2: 23 mmol/L (ref 22–32)
Calcium: 8.3 mg/dL — ABNORMAL LOW (ref 8.9–10.3)
Chloride: 111 mmol/L (ref 98–111)
Creatinine, Ser: 0.61 mg/dL (ref 0.44–1.00)
GFR, Estimated: >60 mL/min (ref >60)
Glucose, Bld: 103 mg/dL — ABNORMAL HIGH (ref 70–99)
Potassium: 4.1 mmol/L (ref 3.5–5.1)
Sodium: 140 mmol/L (ref 135–145)

## 2022-04-07 LAB — CBC
HCT: 31.6 % — ABNORMAL LOW (ref 36.0–46.0)
Hemoglobin: 10.7 g/dL — ABNORMAL LOW (ref 12.0–15.0)
MCH: 31.9 pg (ref 26.0–34.0)
MCHC: 33.9 g/dL (ref 30.0–36.0)
MCV: 94.3 fL (ref 80.0–100.0)
Platelets: 151 10*3/uL (ref 150–400)
RBC: 3.35 MIL/uL — ABNORMAL LOW (ref 3.87–5.11)
RDW: 14.1 % (ref 11.5–15.5)
WBC: 4 10*3/uL (ref 4.0–10.5)
nRBC: 0 % (ref 0.0–0.2)

## 2022-04-07 LAB — MAGNESIUM: Magnesium: 1.8 mg/dL (ref 1.7–2.4)

## 2022-04-07 MED ORDER — POTASSIUM PHOSPHATES 15 MMOLE/5ML IV SOLN
20.0000 mmol | Freq: Once | INTRAVENOUS | Status: AC
  Administered 2022-04-07: 20 mmol via INTRAVENOUS
  Filled 2022-04-07: qty 6.67

## 2022-04-07 NOTE — Progress Notes (Signed)
Subjective: Diarrheal/loose bowel movement this AM.  It was a large bowel movement.  Objective: Vital signs in last 24 hours: Temp:  [97.4 F (36.3 C)-98 F (36.7 C)] 97.6 F (36.4 C) (01/18 0742) Pulse Rate:  [63-85] 67 (01/18 0742) Resp:  [16] 16 (01/18 0527) BP: (139-148)/(71-83) 143/75 (01/18 0742) SpO2:  [96 %-99 %] 98 % (01/18 0742) Last BM Date : 04/06/22  Intake/Output from previous day: 01/17 0701 - 01/18 0700 In: 816.2 [P.O.:80; I.V.:536.2; IV Piggyback:200] Out: -  Intake/Output this shift: No intake/output data recorded.  General appearance: alert and no distress GI: soft, non-tender; bowel sounds normal; no masses,  no organomegaly  Lab Results: Recent Labs    04/04/22 1446 04/06/22 0716 04/07/22 0355  WBC 6.3 5.0 4.0  HGB 13.8 11.8* 10.7*  HCT 41.6 33.3* 31.6*  PLT 177 154 151   BMET Recent Labs    04/05/22 0734 04/06/22 0716 04/07/22 0355  NA 139 138 140  K 3.1* 2.9* 4.1  CL 107 109 111  CO2 19* 19* 23  GLUCOSE 69* 75 103*  BUN 6* <5* <5*  CREATININE 0.68 0.68 0.61  CALCIUM 8.1* 8.1* 8.3*   LFT Recent Labs    04/04/22 1446  PROT 6.8  ALBUMIN 2.7*  AST 21  ALT 12  ALKPHOS 62  BILITOT 0.8   PT/INR No results for input(s): "LABPROT", "INR" in the last 72 hours. Hepatitis Panel No results for input(s): "HEPBSAG", "HCVAB", "HEPAIGM", "HEPBIGM" in the last 72 hours. C-Diff Recent Labs    04/04/22 1930  CDIFFTOX NEGATIVE   Fecal Lactopherrin No results for input(s): "FECLLACTOFRN" in the last 72 hours.  Studies/Results: No results found.  Medications: Scheduled:  cholecalciferol  2,000 Units Oral Daily   enoxaparin (LOVENOX) injection  40 mg Subcutaneous Q24H   estradiol  1 mg Oral Daily   gabapentin  300 mg Oral Q1500   gabapentin  400 mg Oral BID   levothyroxine  112 mcg Oral Q0600   lipase/protease/amylase  72,000 Units Oral TID WC   pantoprazole  40 mg Oral Daily   rosuvastatin  10 mg Oral QHS   Continuous:   cefTRIAXone (ROCEPHIN)  IV Stopped (04/06/22 2220)   metronidazole 500 mg (04/07/22 0929)    Assessment/Plan: 1) EPI. 2) Persistent diarrhea. 3) ? C diff. 4) Anemia. 5) Diverticulitis.   The patient is stable clinically, but she reported having some diarrhea.  If it was solely from EPI, her bowel movements would normalize.  There is the question of C diff.  It appears to be colonization, but she continues to have loose stool.  One dose of colchicine was provided for her, which does cause diarrhea.  The clinical picture is confusing and it will be beneficial to have ID render a recommendation about the C diff.  There is a progressive anemia, but no overt bleeding.  This drop may be in part from her acute illness as well as the inflammatory situation in her sigmoid colon and rectum.  If her HGB continues to decline by tomorrow an EGD/FFS will be performed.  The patient's clinical status was discussed with her son Awilda Bill Portal, an ER physician in [place].  Plan: 1) Continue with Creon. 2) Continue with the regular diet. 3) ID consultation. 4) If anemia worsens tomorrow, an EGD/FFS will be performed tomorrow.    LOS: 2 days   Tracy Hunt D 04/07/2022, 12:05 PM

## 2022-04-07 NOTE — Progress Notes (Signed)
PROGRESS NOTE    Tracy Hunt  QPY:[id number] DOB: 12-24-1945 DOA: 04/04/2022 PCP: Prince Solian, MD    Brief Narrative:  77 year old with history of HTN, hypothyroidism, Sjogren's syndrome, peripheral neuropathy, Bell's palsy who presented to the emergency room with abdominal pain, ongoing diarrhea.  Recently diagnosed with uncomplicated diverticulitis in the office and discharged on Augmentin.  Her main complaint is feeling weak, leg swelling.  Admitted and followed by GI.    Assessment & Plan:   Acute uncomplicated diverticulitis: Some clinical improvement today.  Tolerating regular diet. Episode of diarrhea today morning with mucoid stool.  Continue IV Rocephin and Flagyl today.    Gastroenterology following.  C. difficile antigen positive, toxin is negative.  Currently no indication to treat for C. Difficile, however will discuss with infectious disease team to take the recommendations. Patient also has decreasing hemoglobin and hematocrit likely due to dilution.  Recheck tomorrow morning.  If further drop, recommending EGD.  Electrolytes Persistent hypokalemia, adequate today.  Keep on replacement. Persistent hypomagnesemia, adequate today.  Keep oral replacement. Hypophosphatemia, replaced today.  Right foot pain: Patient has diffuse pain and swelling of both feet. Uric acid is minimally elevated.  ESR and CRP are normal.  Less likely gout.  Colchicine may cause diarrhea.  Symptomatic treatment.  Compression stockings.  Hypothyroidism: On Synthroid.  TSH 0.33.  Will continue similar doses.   DVT prophylaxis: enoxaparin (LOVENOX) injection 40 mg Start: 04/04/22 2200   Code Status: Full code Family Communication: None at bedside Disposition Plan: Status is: Inpatient Remains inpatient appropriate because: Diet challenge and tolerance.     Consultants:  Gastroenterology  Procedures:  None  Antimicrobials:  Rocephin and Flagyl  1/15---   Subjective: Patient was seen and examined.  Denies any abdominal pain or cramping.  Denies any nausea.  She was able to tolerate soft diet.  She had large loose bowel movement today morning with a lot of mucus.  Anxious.  Leg swelling and discomfort is persistent.  Objective: Vitals:   04/06/22 1800 04/06/22 2031 04/07/22 0527 04/07/22 0742  BP: (!) 144/73 139/71 (!) 148/83 (!) 143/75  Pulse: 85 78 63 67  Resp: '16 16 16   '$ Temp: 97.9 F (36.6 C) 98 F (36.7 C) (!) 97.4 F (36.3 C) 97.6 F (36.4 C)  TempSrc: Oral Oral Oral Oral  SpO2: 96% 99% 98% 98%  Weight:      Height:        Intake/Output Summary (Last 24 hours) at 04/07/2022 1305 Last data filed at 04/06/2022 2354 Gross per 24 hour  Intake 816.17 ml  Output --  Net 816.17 ml   Filed Weights   04/05/22 0326  Weight: 70 kg    Examination:  General exam: Appears calm and comfortable at rest.  Appropriately anxious. Respiratory system: Clear to auscultation. Respiratory effort normal. Cardiovascular system: S1 & S2 heard, RRR.  Gastrointestinal system: Soft.  Nontender.  Bowel sound present. Central nervous system: Alert and oriented. No focal neurological deficits. Extremities: Symmetric 5 x 5 power. Skin:  Bilateral swelling of the dorsum of the feet, no erythema or redness.  Minimal hyperesthesia.  Joints are without any swelling or tenderness.    Data Reviewed: I have personally reviewed following labs and imaging studies  CBC: Recent Labs  Lab 03/31/22 1720 04/04/22 1446 04/06/22 0716 04/07/22 0355  WBC 5.8 6.3 5.0 4.0  HGB 12.2 13.8 11.8* 10.7*  HCT 37.1 41.6 33.3* 31.6*  MCV 96.6 97.0 92.2 94.3  PLT 133* 177  154 161   Basic Metabolic Panel: Recent Labs  Lab 03/31/22 1720 04/04/22 1446 04/04/22 2223 04/05/22 0734 04/06/22 0716 04/07/22 0355  NA 138 140  --  139 138 140  K 3.3* 2.7*  --  3.1* 2.9* 4.1  CL 102 106  --  107 109 111  CO2 26 20*  --  19* 19* 23  GLUCOSE 120* 80  --   69* 75 103*  BUN 20 6*  --  6* <5* <5*  CREATININE 0.80 0.67  --  0.68 0.68 0.61  CALCIUM 9.1 8.6*  --  8.1* 8.1* 8.3*  MG  --   --  1.1* 1.5* 1.5* 1.8  PHOS  --   --   --  2.8  --  2.1*   GFR: Estimated Creatinine Clearance: 56.1 mL/min (by C-G formula based on SCr of 0.61 mg/dL). Liver Function Tests: Recent Labs  Lab 03/31/22 1720 04/04/22 1446  AST 21 21  ALT 8 12  ALKPHOS 59 62  BILITOT 0.7 0.8  PROT 6.8 6.8  ALBUMIN 3.2* 2.7*   Recent Labs  Lab 03/31/22 1720 04/04/22 1446  LIPASE 12 28   No results for input(s): "AMMONIA" in the last 168 hours. Coagulation Profile: No results for input(s): "INR", "PROTIME" in the last 168 hours. Cardiac Enzymes: No results for input(s): "CKTOTAL", "CKMB", "CKMBINDEX", "TROPONINI" in the last 168 hours. BNP (last 3 results) No results for input(s): "PROBNP" in the last 8760 hours. HbA1C: No results for input(s): "HGBA1C" in the last 72 hours. CBG: No results for input(s): "GLUCAP" in the last 168 hours. Lipid Profile: No results for input(s): "CHOL", "HDL", "LDLCALC", "TRIG", "CHOLHDL", "LDLDIRECT" in the last 72 hours. Thyroid Function Tests: Recent Labs    04/04/22 2222 04/06/22 0716  TSH 0.033*  --   FREET4  --  1.44*   Anemia Panel: No results for input(s): "VITAMINB12", "FOLATE", "FERRITIN", "TIBC", "IRON", "RETICCTPCT" in the last 72 hours. Sepsis Labs: Recent Labs  Lab 04/04/22 1448 04/04/22 1822  LATICACIDVEN 1.4 1.0    Recent Results (from the past 240 hour(s))  C Difficile Quick Screen w PCR reflex     Status: Abnormal   Collection Time: 04/04/22  7:30 PM   Specimen: STOOL  Result Value Ref Range Status   C Diff antigen POSITIVE (A) NEGATIVE Final   C Diff toxin NEGATIVE NEGATIVE Final   C Diff interpretation Results are indeterminate. See PCR results.  Final    Comment: Performed at [facility], [place] [address], [place], [place] [address]  C. Diff by PCR, Reflexed     Status: Abnormal    Collection Time: 04/04/22  7:30 PM  Result Value Ref Range Status   Toxigenic C. Difficile by PCR POSITIVE (A) NEGATIVE Final    Comment: Positive for toxigenic C. difficile with little to no toxin production. Only treat if clinical presentation suggests symptomatic illness. Performed at [facility], [place] [address] [place], [place] [address]          Radiology Studies: No results found.      Scheduled Meds:  cholecalciferol  2,000 Units Oral Daily   enoxaparin (LOVENOX) injection  40 mg Subcutaneous Q24H   estradiol  1 mg Oral Daily   gabapentin  300 mg Oral Q1500   gabapentin  400 mg Oral BID   levothyroxine  112 mcg Oral Q0600   lipase/protease/amylase  72,000 Units Oral TID WC   pantoprazole  40 mg Oral Daily  rosuvastatin  10 mg Oral QHS   Continuous Infusions:  cefTRIAXone (ROCEPHIN)  IV Stopped (04/06/22 2220)   metronidazole 500 mg (04/07/22 0929)   potassium PHOSPHATE IVPB (in mmol)       LOS: 2 days    Time spent: 35 minutes    Barb Merino, MD [facility] Pager [phone]

## 2022-04-07 NOTE — Progress Notes (Signed)
Mobility Specialist Progress Note   04/07/22 1145  Mobility  Activity Ambulated with assistance in hallway;Dangled on edge of bed  Level of Assistance Standby assist, set-up cues, supervision of patient - no hands on  Assistive Device Front wheel walker  Distance Ambulated (ft) 220 ft  Range of Motion/Exercises Active;All extremities  Activity Response Tolerated well   Patient received standing at bedside and eager to participate. Ambulated supervision level with slow steady gait. Returned to room without complaint or incident. Was left dangling with all needs met, call bell in reach.  [place] Corrissa Martello, BS EXP Mobility Specialist Please contact via SecureChat or Rehab office at [phone]

## 2022-04-08 ENCOUNTER — Ambulatory Visit: Payer: Medicare HMO

## 2022-04-08 ENCOUNTER — Other Ambulatory Visit (HOSPITAL_COMMUNITY): Payer: Self-pay

## 2022-04-08 DIAGNOSIS — K5792 Diverticulitis of intestine, part unspecified, without perforation or abscess without bleeding: Secondary | ICD-10-CM | POA: Diagnosis not present

## 2022-04-08 LAB — BASIC METABOLIC PANEL
Anion gap: 4 — ABNORMAL LOW (ref 5–15)
BUN: <5 mg/dL — ABNORMAL LOW (ref 8–23)
CO2: 26 mmol/L (ref 22–32)
Calcium: 8.5 mg/dL — ABNORMAL LOW (ref 8.9–10.3)
Chloride: 110 mmol/L (ref 98–111)
Creatinine, Ser: 0.59 mg/dL (ref 0.44–1.00)
GFR, Estimated: >60 mL/min (ref >60)
Glucose, Bld: 99 mg/dL (ref 70–99)
Potassium: 4 mmol/L (ref 3.5–5.1)
Sodium: 140 mmol/L (ref 135–145)

## 2022-04-08 LAB — CBC
HCT: 32.1 % — ABNORMAL LOW (ref 36.0–46.0)
Hemoglobin: 10.9 g/dL — ABNORMAL LOW (ref 12.0–15.0)
MCH: 32.2 pg (ref 26.0–34.0)
MCHC: 34 g/dL (ref 30.0–36.0)
MCV: 95 fL (ref 80.0–100.0)
Platelets: 141 10*3/uL — ABNORMAL LOW (ref 150–400)
RBC: 3.38 MIL/uL — ABNORMAL LOW (ref 3.87–5.11)
RDW: 14.7 % (ref 11.5–15.5)
WBC: 3.1 10*3/uL — ABNORMAL LOW (ref 4.0–10.5)
nRBC: 0 % (ref 0.0–0.2)

## 2022-04-08 LAB — MAGNESIUM: Magnesium: 1.3 mg/dL — ABNORMAL LOW (ref 1.7–2.4)

## 2022-04-08 MED ORDER — POTASSIUM CHLORIDE CRYS ER 20 MEQ PO TBCR: 20.0000 meq | EXTENDED_RELEASE_TABLET | Freq: Every day | ORAL | 0 refills | Status: DC

## 2022-04-08 MED ORDER — MAGNESIUM OXIDE -MG SUPPLEMENT 400 (240 MG) MG PO TABS
400.0000 mg | ORAL_TABLET | Freq: Every day | ORAL | Status: DC
  Administered 2022-04-08: 400 mg via ORAL
  Filled 2022-04-08: qty 1

## 2022-04-08 MED ORDER — MAGNESIUM OXIDE 400 MG PO TABS
400.0000 mg | ORAL_TABLET | Freq: Every day | ORAL | 0 refills | Status: AC
  Filled 2022-04-08: qty 30, 30d supply, fill #0

## 2022-04-08 MED ORDER — PREDNISONE 20 MG PO TABS
20.0000 mg | ORAL_TABLET | Freq: Every day | ORAL | Status: DC
  Administered 2022-04-08: 20 mg via ORAL
  Filled 2022-04-08: qty 1

## 2022-04-08 MED ORDER — MAGNESIUM SULFATE 4 GM/100ML IV SOLN
4.0000 g | Freq: Once | INTRAVENOUS | Status: AC
  Administered 2022-04-08: 4 g via INTRAVENOUS
  Filled 2022-04-08: qty 100

## 2022-04-08 MED ORDER — MAGNESIUM OXIDE -MG SUPPLEMENT 400 (240 MG) MG PO TABS: 400.0000 mg | ORAL_TABLET | Freq: Every day | ORAL | 0 refills | Status: DC

## 2022-04-08 MED ORDER — PANCRELIPASE (LIP-PROT-AMYL) 36000-114000 UNITS PO CPEP: 72000.0000 [IU] | ORAL_CAPSULE | Freq: Three times a day (TID) | ORAL | 0 refills | Status: DC

## 2022-04-08 MED ORDER — POTASSIUM CHLORIDE CRYS ER 20 MEQ PO TBCR
40.0000 meq | EXTENDED_RELEASE_TABLET | Freq: Every day | ORAL | Status: DC
  Administered 2022-04-08: 40 meq via ORAL
  Filled 2022-04-08: qty 2

## 2022-04-08 MED ORDER — POTASSIUM CHLORIDE CRYS ER 20 MEQ PO TBCR
20.0000 meq | EXTENDED_RELEASE_TABLET | Freq: Every day | ORAL | 0 refills | Status: DC
  Filled 2022-04-08: qty 14, 14d supply, fill #0

## 2022-04-08 MED ORDER — PANCRELIPASE (LIP-PROT-AMYL) 36000-114000 UNITS PO CPEP
72000.0000 [IU] | ORAL_CAPSULE | Freq: Three times a day (TID) | ORAL | 0 refills | Status: DC
  Filled 2022-04-08: qty 120, 20d supply, fill #0

## 2022-04-08 NOTE — Progress Notes (Signed)
Subjective: Feeling well.  No new complaints.  Objective: Vital signs in last 24 hours: Temp:  [97.4 F (36.3 C)-99.1 F (37.3 C)] 97.6 F (36.4 C) (01/19 0842) Pulse Rate:  [56-80] 56 (01/19 0842) Resp:  [18] 18 (01/19 0842) BP: (125-145)/(62-77) 125/62 (01/19 0842) SpO2:  [98 %-99 %] 98 % (01/19 0842) Last BM Date : 04/07/22  Intake/Output from previous day: 01/18 0701 - 01/19 0700 In: 200 [IV Piggyback:200] Out: -  Intake/Output this shift: No intake/output data recorded.  General appearance: alert and no distress GI: soft, non-tender; bowel sounds normal; no masses,  no organomegaly  Lab Results: Recent Labs    04/06/22 0716 04/07/22 0355 04/08/22 0636  WBC 5.0 4.0 3.1*  HGB 11.8* 10.7* 10.9*  HCT 33.3* 31.6* 32.1*  PLT 154 151 141*   BMET Recent Labs    04/06/22 0716 04/07/22 0355 04/08/22 0636  NA 138 140 140  K 2.9* 4.1 4.0  CL 109 111 110  CO2 19* 23 26  GLUCOSE 75 103* 99  BUN <5* <5* <5*  CREATININE 0.68 0.61 0.59  CALCIUM 8.1* 8.3* 8.5*   LFT No results for input(s): "PROT", "ALBUMIN", "AST", "ALT", "ALKPHOS", "BILITOT", "BILIDIR", "IBILI" in the last 72 hours. PT/INR No results for input(s): "LABPROT", "INR" in the last 72 hours. Hepatitis Panel No results for input(s): "HEPBSAG", "HCVAB", "HEPAIGM", "HEPBIGM" in the last 72 hours. C-Diff No results for input(s): "CDIFFTOX" in the last 72 hours. Fecal Lactopherrin No results for input(s): "FECLLACTOFRN" in the last 72 hours.  Studies/Results: No results found.  Medications: Scheduled:  cholecalciferol  2,000 Units Oral Daily   enoxaparin (LOVENOX) injection  40 mg Subcutaneous Q24H   estradiol  1 mg Oral Daily   gabapentin  300 mg Oral Q1500   gabapentin  400 mg Oral BID   levothyroxine  112 mcg Oral Q0600   lipase/protease/amylase  72,000 Units Oral TID WC   magnesium oxide  400 mg Oral Daily   pantoprazole  40 mg Oral Daily   potassium chloride  40 mEq Oral Daily   predniSONE   20 mg Oral Q breakfast   rosuvastatin  10 mg Oral QHS   Continuous:  cefTRIAXone (ROCEPHIN)  IV 2 g (04/07/22 2302)   magnesium sulfate bolus IVPB 4 g (04/08/22 1206)   metronidazole 500 mg (04/08/22 1026)    Assessment/Plan: 1) EPI. 2) Diverticulitis. 3) Weight loss. 4) Anemia - stable.   The patient is well.  Her last diarrheal bowel movement was early afternoon yesterday, almost 24 hours.  It was a small bout of diarrhea.  Dr. Sloan Leiter spoke with ID and they stated that the patient does not have C diff.  From the GI standpoint she is stable and feeling well.  She is ready for D/C.  A phone follow up will be made on Monday and further follow up will be recommended and made pending her response over the weekend at home.  She still has passage of mucus outside of any bowel movements.  The etiology is not known.  She was instructed to resume Metamucil QHS.  Her son was again updated with the plan and all questions were answered.  Plan: 1) Continue with Creon 36,000 two capsules with meals TID. 2) Upon discharge she can transition back to using her Augmentin and she is to complete the course. 3) Phone update on Monday. 4) Advance diet as tolerated at home.  LOS: 3 days   Tracy Hunt D 04/08/2022, 12:20 PM

## 2022-04-08 NOTE — Plan of Care (Signed)

## 2022-04-08 NOTE — Discharge Summary (Signed)
Physician Discharge Summary  Mesa Del Portal FAO:[id number] DOB: Sep 08, 1945 DOA: 04/04/2022  PCP: Prince Solian, MD  Admit date: 04/04/2022 Discharge date: 04/08/2022  Admitted From: Home Disposition: Home  Recommendations for Outpatient Follow-up:  Follow up with PCP in 1-2 weeks Please obtain BMP/CBC/magnesium/phosphorus in one week GI clinic will schedule follow-up  Home Health: N/A Equipment/Devices: N/A  Discharge Condition: Stable CODE STATUS: Full code Diet recommendation: Regular diet, plenty of hydration.  Continue Metamucil.  Discharge summary: 77 year old with history of HTN, hypothyroidism, Sjogren's syndrome, peripheral neuropathy, Bell's palsy who presented to the emergency room with abdominal pain and ongoing diarrhea.  Recently diagnosed with uncomplicated diverticulitis in the office and discharged on Augmentin.  Her main complaint is feeling weak, leg swelling.  Admitted and followed by GI.  Treated with broad-spectrum antibiotics.  Clinically improving.    Acute uncomplicated diverticulitis: clinical improvement today.  Tolerating regular diet. She had few episodes of diarrhea but none since last 24 hours.  Treated with IV Rocephin and Flagyl.  Seen and followed by gastroenterology. C. difficile antigen positive, toxin is negative.  Currently no indication to treat for C. Difficile, also discussed with infectious disease team and recommended not to treat C. difficile antigen.  Clinically stabilized today.  GI recommended to discharge on -Complete remaining course of Augmentin -Prescribed Creon 72,000 3 times daily. -Patient to continue Metamucil to avoid constipation. -GI clinic will schedule follow-up.   Electrolytes Persistent hypokalemia, adequate today.  Discharge on oral replacement. Persistent hypomagnesemia, persistently low.  Given high-dose IV replacement today with 4 g of magnesium rider.  Will discharge patient on magnesium oxide 400 mg  daily. Hypophosphatemia, replaced and adequate.   Right foot pain: Patient has diffuse pain and swelling of both feet. Uric acid is minimally elevated.  ESR and CRP are normal.  Less likely gout.  Colchicine was tried but caused diarrhea.  Symptomatic treatment.  Will give her 1 dose of prednisone today to see if that helps.  If prednisone is helpful, she can use that in the future.  Compression stockings.   Hypothyroidism: On Synthroid.  TSH 0.33.  Will continue similar doses.  Stable for discharge with outpatient follow-up plan at GI clinic.  Discharge Diagnoses:  Principal Problem:   Acute diverticulitis Active Problems:   Abdominal pain   Hypothyroidism   HTN (hypertension)   Hypokalemia   Hypocalcemia   Right foot pain   Glaucoma   Diverticulitis    Discharge Instructions  Discharge Instructions     Call MD for:  severe uncontrolled pain   Complete by: As directed    Diet general   Complete by: As directed    Increase activity slowly   Complete by: As directed       Allergies as of 04/08/2022       Reactions   Other Itching   Adhesive tape    Oyster Shell Hives   Shellfish Allergy Nausea And Vomiting   Oysters        Medication List     STOP taking these medications    traMADol 50 MG tablet Commonly known as: ULTRAM       TAKE these medications    acetaminophen 650 MG CR tablet Commonly known as: TYLENOL Take 1,300 mg by mouth in the morning, at noon, and at bedtime. Takes 2 tablets in the morning, 2 in the afternoon, and 2 at bedtime.   amoxicillin-clavulanate 875-125 MG tablet Commonly known as: AUGMENTIN Take 1 tablet by mouth every 12 (  twelve) hours.   B-12 1000-400 MCG Subl Place 1 tablet under the tongue daily.   BIOFREEZE EX Apply 1 drop topically daily as needed (back pain).   bismuth subsalicylate 093 MG chewable tablet Commonly known as: PEPTO BISMOL Chew 524 mg by mouth as needed for indigestion or diarrhea or loose  stools.   Carboxymethylcellulose Sodium 1 % Gel Place 1 drop into both eyes at bedtime.   CENTRUM SILVER 50+WOMEN PO Take 1 tablet by mouth daily.   Combigan 0.2-0.5 % ophthalmic solution Generic drug: brimonidine-timolol Place 1 drop into both eyes every 12 (twelve) hours.   estradiol 1 MG tablet Commonly known as: ESTRACE Take 1 mg by mouth daily.   gabapentin 100 MG capsule Commonly known as: NEURONTIN Take 1 tablet in the morning and bedtime along with '300mg'$  tablets. What changed:  how much to take how to take this when to take this additional instructions   gabapentin 300 MG capsule Commonly known as: NEURONTIN Take 1 capsule (300 mg total) by mouth 3 (three) times daily. What changed: Another medication with the same name was changed. Make sure you understand how and when to take each.   glycerin adult 2 g suppository Place 1 suppository rectally as needed for constipation.   hydroxypropyl methylcellulose / hypromellose 2.5 % ophthalmic solution Commonly known as: ISOPTO TEARS / GONIOVISC Place 1 drop into both eyes daily.   levothyroxine 112 MCG tablet Commonly known as: SYNTHROID Take 112 mcg by mouth daily.   lipase/protease/amylase 36000 UNITS Cpep capsule Commonly known as: CREON Take 2 capsules (72,000 Units total) by mouth 3 (three) times daily with meals.   magnesium oxide 400 (240 Mg) MG tablet Commonly known as: MAG-OX Take 1 tablet (400 mg total) by mouth daily. Start taking on: April 09, 2022   omeprazole 20 MG capsule Commonly known as: PRILOSEC Take 20 mg by mouth daily.   potassium chloride SA 20 MEQ tablet Commonly known as: KLOR-CON M Take 1 tablet (20 mEq total) by mouth daily for 14 days. Start taking on: April 09, 2022   psyllium 58.6 % packet Commonly known as: METAMUCIL Take 1 packet by mouth daily as needed (constipation).   rosuvastatin 10 MG tablet Commonly known as: CRESTOR Take 10 mg by mouth at bedtime.    simethicone 125 MG chewable tablet Commonly known as: MYLICON Chew 818 mg by mouth every 6 (six) hours as needed for flatulence.   SYSTANE COMPLETE OP Place 1 drop into both eyes 2 (two) times daily as needed (dry eyes).   Vitamin D 50 MCG (2000 UT) tablet Take 2,000 Units by mouth daily.        Allergies  Allergen Reactions   Other Itching    Adhesive tape    Oyster Shell Hives   Shellfish Allergy Nausea And Vomiting    Oysters    Consultations: Gastroenterology   Procedures/Studies: CT ABDOMEN PELVIS W CONTRAST  Result Date: 04/04/2022 CLINICAL DATA:  Abdominal pain, acute, nonlocalized. Clinical concern for inflammatory bowel disease. EXAM: CT ABDOMEN AND PELVIS WITH CONTRAST TECHNIQUE: Multidetector CT imaging of the abdomen and pelvis was performed using the standard protocol following bolus administration of intravenous contrast. RADIATION DOSE REDUCTION: This exam was performed according to the departmental dose-optimization program which includes automated exposure control, adjustment of the mA and/or kV according to patient size and/or use of iterative reconstruction technique. CONTRAST:  14m OMNIPAQUE IOHEXOL 350 MG/ML SOLN COMPARISON:  03/31/2022. FINDINGS: Lower chest: No acute abnormality.  A small hiatal  hernia is noted. Hepatobiliary: No focal liver abnormality is seen. Status post cholecystectomy. No biliary dilatation. Pancreas: Unremarkable. No pancreatic ductal dilatation or surrounding inflammatory changes. Spleen: Normal size.  Calcified granuloma are noted. Adrenals/Urinary Tract: No adrenal nodule or mass. The kidneys enhance symmetrically. No renal calculus or hydronephrosis. The bladder is unremarkable. Stomach/Bowel: There is a small hiatal hernia. The stomach is within normal limits. No bowel obstruction, free air, or pneumatosis. Scattered diverticula are present along the colon. There is bowel wall thickening involving the sigmoid colon and proximal  rectum common decreased. There is persistent pericolonic fat stranding involving the proximal to mid sigmoid colon. No abscess is identified. The appendix is not seen. Vascular/Lymphatic: Aortic atherosclerosis. No enlarged abdominal or pelvic lymph nodes. Reproductive: Status post hysterectomy. No adnexal masses. Other: No abdominopelvic ascites. Fat containing hernia in the ventral abdominal wall in the right upper quadrant. Musculoskeletal: Degenerative changes in the thoracolumbar spine. No acute or suspicious osseous abnormality. IMPRESSION: 1. Decreased bowel wall thickening involving the sigmoid colon and rectum, improved from the prior exam. There are scattered diverticula along the proximal to mid to sigmoid colon with surrounding inflammatory changes suggesting superimposed diverticulitis, unchanged. 2. Aortic atherosclerosis. Electronically Signed   By: Brett Fairy M.D.   On: 04/04/2022 20:24   DG Foot Complete Right  Result Date: 04/04/2022 CLINICAL DATA:  Right foot pain common no known injury, initial encounter EXAM: RIGHT FOOT COMPLETE - 3+ VIEW COMPARISON:  None Available. FINDINGS: Calcaneal spurring is noted. No acute fracture or dislocation is noted. No soft tissue abnormality is seen. Mild tarsal degenerative changes are noted. IMPRESSION: Degenerative change without acute abnormality. Electronically Signed   By: Inez Catalina M.D.   On: 04/04/2022 19:33   CT ABDOMEN PELVIS W CONTRAST  Addendum Date: 03/31/2022   ADDENDUM REPORT: 03/31/2022 23:07 ADDENDUM: Upon further review, the inflammatory changes involving the sigmoid colon are relatively extensive involving the majority of the sigmoid colon and extending to involve the rectum and anus. While more severe within the sigmoid colon, the extent of disease involves regions geographically remote from moderate background sigmoid diverticulosis and the imaging findings favor that of a infectious or inflammatory proctocolitis as can be seen  with pseudomembranous colitis or inflammatory bowel disease. Moderate colonic stool burden is noted proximal to the inflamed segment without abnormal dilation of this bowel to suggest a resultant obstruction. No free intraperitoneal gas, fluid, or loculated pericolonic fluid collections to suggest perforation. Reactive lymph node and within the sigmoid mesentery. The inferior mesenteric vein and artery appear patent. Electronically Signed   By: Fidela Salisbury M.D.   On: 03/31/2022 23:07   Result Date: 03/31/2022 CLINICAL DATA:  Left lower quadrant abdominal pain EXAM: CT ABDOMEN AND PELVIS WITH CONTRAST TECHNIQUE: Multidetector CT imaging of the abdomen and pelvis was performed using the standard protocol following bolus administration of intravenous contrast. RADIATION DOSE REDUCTION: This exam was performed according to the departmental dose-optimization program which includes automated exposure control, adjustment of the mA and/or kV according to patient size and/or use of iterative reconstruction technique. CONTRAST:  14m OMNIPAQUE IOHEXOL 300 MG/ML  SOLN COMPARISON:  CT abdomen and pelvis dated January 13, 2022 FINDINGS: Lower chest: Small hiatal hernia.  No acute abnormality. Hepatobiliary: No focal liver abnormality is seen. Status post cholecystectomy. No biliary dilatation. Pancreas: Unremarkable. No pancreatic ductal dilatation or surrounding inflammatory changes. Spleen: Normal in size without focal abnormality. Adrenals/Urinary Tract: Bilateral adrenal glands are unremarkable. No hydronephrosis nephrolithiasis. Bladder is unremarkable. Stomach/Bowel: Wall  thickening and diverticulosis of the sigmoid colon with adjacent inflammatory change. No evidence of obstruction. Vascular/Lymphatic: Aortic atherosclerosis. No enlarged abdominal or pelvic lymph nodes. Reproductive: No adnexal mass. Other: Small fat containing ventral abdominal wall hernia of the upper right anterior abdominal wall. No  abdominopelvic ascites. Musculoskeletal: No acute or significant osseous findings. IMPRESSION: 1. Findings compatible with acute uncomplicated sigmoid diverticulitis. 2. Aortic Atherosclerosis (ICD10-I70.0). Electronically Signed: By: Yetta Glassman M.D. On: 03/31/2022 19:53   (Echo, Carotid, EGD, Colonoscopy, ERCP)    Subjective: Seen in the morning rounds.  No more diarrhea or bowel movement since yesterday afternoon.  Denies any abdominal pain nausea or vomiting.  Eating regular diet.  Afebrile.   Discharge Exam: Vitals:   04/08/22 0519 04/08/22 0842  BP:  125/62  Pulse: (!) 58 (!) 56  Resp: 18 18  Temp: (!) 97.4 F (36.3 C) 97.6 F (36.4 C)  SpO2: 99% 98%   Vitals:   04/07/22 0742 04/07/22 2029 04/08/22 0519 04/08/22 0842  BP: (!) 143/75 (!) 145/77  125/62  Pulse: 67 80 (!) 58 (!) 56  Resp:  '18 18 18  '$ Temp: 97.6 F (36.4 C) 99.1 F (37.3 C) (!) 97.4 F (36.3 C) 97.6 F (36.4 C)  TempSrc: Oral Oral Oral Oral  SpO2: 98% 99% 99% 98%  Weight:      Height:        General: Pt is alert, awake, not in acute distress Cardiovascular: RRR, S1/S2 +, no rubs, no gallops Respiratory: CTA bilaterally, no wheezing, no rhonchi Abdominal: Soft, NT, ND, bowel sounds + Extremities: Mild swelling with 1+ edema both dorsum of the feet.  No joint effusion.  No cyanosis    The results of significant diagnostics from this hospitalization (including imaging, microbiology, ancillary and laboratory) are listed below for reference.     Microbiology: Recent Results (from the past 240 hour(s))  C Difficile Quick Screen w PCR reflex     Status: Abnormal   Collection Time: 04/04/22  7:30 PM   Specimen: STOOL  Result Value Ref Range Status   C Diff antigen POSITIVE (A) NEGATIVE Final   C Diff toxin NEGATIVE NEGATIVE Final   C Diff interpretation Results are indeterminate. See PCR results.  Final    Comment: Performed at [facility], [place] [address], [place], [place] [address]  C.  Diff by PCR, Reflexed     Status: Abnormal   Collection Time: 04/04/22  7:30 PM  Result Value Ref Range Status   Toxigenic C. Difficile by PCR POSITIVE (A) NEGATIVE Final    Comment: Positive for toxigenic C. difficile with little to no toxin production. Only treat if clinical presentation suggests symptomatic illness. Performed at [facility], [place] [address] [place], [place] [address]      Labs: BNP (last 3 results) No results for input(s): "BNP" in the last 8760 hours. Basic Metabolic Panel: Recent Labs  Lab 04/04/22 1446 04/04/22 2223 04/05/22 0734 04/06/22 0716 04/07/22 0355 04/08/22 0636  NA 140  --  139 138 140 140  K 2.7*  --  3.1* 2.9* 4.1 4.0  CL 106  --  107 109 111 110  CO2 20*  --  19* 19* 23 26  GLUCOSE 80  --  69* 75 103* 99  BUN 6*  --  6* <5* <5* <5*  CREATININE 0.67  --  0.68 0.68 0.61 0.59  CALCIUM 8.6*  --  8.1* 8.1* 8.3* 8.5*  MG  --  1.1*  1.5* 1.5* 1.8 1.3*  PHOS  --   --  2.8  --  2.1*  --    Liver Function Tests: Recent Labs  Lab 04/04/22 1446  AST 21  ALT 12  ALKPHOS 62  BILITOT 0.8  PROT 6.8  ALBUMIN 2.7*   Recent Labs  Lab 04/04/22 1446  LIPASE 28   No results for input(s): "AMMONIA" in the last 168 hours. CBC: Recent Labs  Lab 04/04/22 1446 04/06/22 0716 04/07/22 0355 04/08/22 0636  WBC 6.3 5.0 4.0 3.1*  HGB 13.8 11.8* 10.7* 10.9*  HCT 41.6 33.3* 31.6* 32.1*  MCV 97.0 92.2 94.3 95.0  PLT 177 154 151 141*   Cardiac Enzymes: No results for input(s): "CKTOTAL", "CKMB", "CKMBINDEX", "TROPONINI" in the last 168 hours. BNP: Invalid input(s): "POCBNP" CBG: No results for input(s): "GLUCAP" in the last 168 hours. D-Dimer No results for input(s): "DDIMER" in the last 72 hours. Hgb A1c No results for input(s): "HGBA1C" in the last 72 hours. Lipid Profile No results for input(s): "CHOL", "HDL", "LDLCALC", "TRIG", "CHOLHDL", "LDLDIRECT" in the last 72 hours. Thyroid function studies No results for input(s): "TSH",  "T4TOTAL", "T3FREE", "THYROIDAB" in the last 72 hours.  Invalid input(s): "FREET3" Anemia work up No results for input(s): "VITAMINB12", "FOLATE", "FERRITIN", "TIBC", "IRON", "RETICCTPCT" in the last 72 hours. Urinalysis    Component Value Date/Time   COLORURINE YELLOW 04/04/2022 2123   APPEARANCEUR CLEAR 04/04/2022 2123   LABSPEC >1.046 (H) 04/04/2022 2123   PHURINE 6.0 04/04/2022 2123   GLUCOSEU NEGATIVE 04/04/2022 2123   HGBUR NEGATIVE 04/04/2022 2123   BILIRUBINUR NEGATIVE 04/04/2022 2123   BILIRUBINUR negative 03/16/2022 0933   KETONESUR 80 (A) 04/04/2022 2123   PROTEINUR NEGATIVE 04/04/2022 2123   UROBILINOGEN 1.0 03/16/2022 0933   NITRITE NEGATIVE 04/04/2022 2123   LEUKOCYTESUR NEGATIVE 04/04/2022 2123   Sepsis Labs Recent Labs  Lab 04/04/22 1446 04/06/22 0716 04/07/22 0355 04/08/22 0636  WBC 6.3 5.0 4.0 3.1*   Microbiology Recent Results (from the past 240 hour(s))  C Difficile Quick Screen w PCR reflex     Status: Abnormal   Collection Time: 04/04/22  7:30 PM   Specimen: STOOL  Result Value Ref Range Status   C Diff antigen POSITIVE (A) NEGATIVE Final   C Diff toxin NEGATIVE NEGATIVE Final   C Diff interpretation Results are indeterminate. See PCR results.  Final    Comment: Performed at [facility], [place] [address], [place], [place] [address]  C. Diff by PCR, Reflexed     Status: Abnormal   Collection Time: 04/04/22  7:30 PM  Result Value Ref Range Status   Toxigenic C. Difficile by PCR POSITIVE (A) NEGATIVE Final    Comment: Positive for toxigenic C. difficile with little to no toxin production. Only treat if clinical presentation suggests symptomatic illness. Performed at [facility], [place] [address], [place], [place] [address]      Time coordinating discharge: 35 minutes  SIGNED:   Barb Merino, MD  [facility] 04/08/2022, 12:37 PM

## 2022-04-08 NOTE — Progress Notes (Signed)
Mobility Specialist Progress Note:   04/08/22 1142  Mobility  Activity Ambulated with assistance in hallway  Level of Assistance Minimal assist, patient does 75% or more  Assistive Device Front wheel walker  Distance Ambulated (ft) 500 ft  Activity Response Tolerated well  Mobility Referral Yes  $Mobility charge 1 Mobility   Pt received ambulating in room and agreeable. Independent with pericare. No complaints. Pt returned sitting EOB with all needs met and call bell in reach.   Andrey Campanile Mobility Specialist Please contact via SecureChat or  Rehab office at [phone]

## 2022-04-14 DIAGNOSIS — M359 Systemic involvement of connective tissue, unspecified: Secondary | ICD-10-CM | POA: Diagnosis not present

## 2022-04-14 DIAGNOSIS — K5792 Diverticulitis of intestine, part unspecified, without perforation or abscess without bleeding: Secondary | ICD-10-CM | POA: Diagnosis not present

## 2022-04-14 DIAGNOSIS — K8689 Other specified diseases of pancreas: Secondary | ICD-10-CM | POA: Diagnosis not present

## 2022-04-14 DIAGNOSIS — M79671 Pain in right foot: Secondary | ICD-10-CM | POA: Diagnosis not present

## 2022-04-14 DIAGNOSIS — R251 Tremor, unspecified: Secondary | ICD-10-CM | POA: Diagnosis not present

## 2022-04-14 DIAGNOSIS — E039 Hypothyroidism, unspecified: Secondary | ICD-10-CM | POA: Diagnosis not present

## 2022-04-14 DIAGNOSIS — M79645 Pain in left finger(s): Secondary | ICD-10-CM | POA: Diagnosis not present

## 2022-04-14 DIAGNOSIS — R531 Weakness: Secondary | ICD-10-CM | POA: Diagnosis not present

## 2022-04-14 DIAGNOSIS — Z6828 Body mass index (BMI) 28.0-28.9, adult: Secondary | ICD-10-CM | POA: Diagnosis not present

## 2022-04-14 DIAGNOSIS — E876 Hypokalemia: Secondary | ICD-10-CM | POA: Diagnosis not present

## 2022-04-14 DIAGNOSIS — R351 Nocturia: Secondary | ICD-10-CM | POA: Diagnosis not present

## 2022-04-14 DIAGNOSIS — G5793 Unspecified mononeuropathy of bilateral lower limbs: Secondary | ICD-10-CM | POA: Diagnosis not present

## 2022-04-14 DIAGNOSIS — M1A09X0 Idiopathic chronic gout, multiple sites, without tophus (tophi): Secondary | ICD-10-CM | POA: Diagnosis not present

## 2022-04-14 DIAGNOSIS — R600 Localized edema: Secondary | ICD-10-CM | POA: Diagnosis not present

## 2022-04-14 DIAGNOSIS — E663 Overweight: Secondary | ICD-10-CM | POA: Diagnosis not present

## 2022-04-14 DIAGNOSIS — M1991 Primary osteoarthritis, unspecified site: Secondary | ICD-10-CM | POA: Diagnosis not present

## 2022-04-15 ENCOUNTER — Inpatient Hospital Stay (HOSPITAL_COMMUNITY)
Admission: EM | Admit: 2022-04-15 | Discharge: 2022-04-22 | DRG: 372 | Disposition: A | Discharge status: Home Health | Payer: Medicare HMO | Attending: Family Medicine | Admitting: Family Medicine

## 2022-04-15 DIAGNOSIS — Z91011 Allergy to milk products: Secondary | ICD-10-CM

## 2022-04-15 DIAGNOSIS — I10 Essential (primary) hypertension: Secondary | ICD-10-CM | POA: Diagnosis present

## 2022-04-15 DIAGNOSIS — D638 Anemia in other chronic diseases classified elsewhere: Secondary | ICD-10-CM | POA: Diagnosis not present

## 2022-04-15 DIAGNOSIS — K8689 Other specified diseases of pancreas: Secondary | ICD-10-CM | POA: Diagnosis present

## 2022-04-15 DIAGNOSIS — D509 Iron deficiency anemia, unspecified: Secondary | ICD-10-CM | POA: Diagnosis present

## 2022-04-15 DIAGNOSIS — R509 Fever, unspecified: Secondary | ICD-10-CM | POA: Diagnosis not present

## 2022-04-15 DIAGNOSIS — R933 Abnormal findings on diagnostic imaging of other parts of digestive tract: Secondary | ICD-10-CM | POA: Diagnosis not present

## 2022-04-15 DIAGNOSIS — Z1152 Encounter for screening for COVID-19: Secondary | ICD-10-CM | POA: Diagnosis not present

## 2022-04-15 DIAGNOSIS — K5792 Diverticulitis of intestine, part unspecified, without perforation or abscess without bleeding: Secondary | ICD-10-CM | POA: Diagnosis not present

## 2022-04-15 DIAGNOSIS — Z800 Family history of malignant neoplasm of digestive organs: Secondary | ICD-10-CM

## 2022-04-15 DIAGNOSIS — R109 Unspecified abdominal pain: Secondary | ICD-10-CM | POA: Diagnosis not present

## 2022-04-15 DIAGNOSIS — K219 Gastro-esophageal reflux disease without esophagitis: Secondary | ICD-10-CM | POA: Diagnosis present

## 2022-04-15 DIAGNOSIS — Z801 Family history of malignant neoplasm of trachea, bronchus and lung: Secondary | ICD-10-CM

## 2022-04-15 DIAGNOSIS — E785 Hyperlipidemia, unspecified: Secondary | ICD-10-CM | POA: Diagnosis present

## 2022-04-15 DIAGNOSIS — Z90710 Acquired absence of both cervix and uterus: Secondary | ICD-10-CM

## 2022-04-15 DIAGNOSIS — H409 Unspecified glaucoma: Secondary | ICD-10-CM | POA: Diagnosis present

## 2022-04-15 DIAGNOSIS — Z79890 Hormone replacement therapy (postmenopausal): Secondary | ICD-10-CM

## 2022-04-15 DIAGNOSIS — A0472 Enterocolitis due to Clostridium difficile, not specified as recurrent: Secondary | ICD-10-CM | POA: Diagnosis not present

## 2022-04-15 DIAGNOSIS — D6959 Other secondary thrombocytopenia: Secondary | ICD-10-CM | POA: Diagnosis not present

## 2022-04-15 DIAGNOSIS — Z96612 Presence of left artificial shoulder joint: Secondary | ICD-10-CM | POA: Diagnosis present

## 2022-04-15 DIAGNOSIS — K6289 Other specified diseases of anus and rectum: Secondary | ICD-10-CM | POA: Diagnosis present

## 2022-04-15 DIAGNOSIS — M3500 Sicca syndrome, unspecified: Secondary | ICD-10-CM | POA: Diagnosis present

## 2022-04-15 DIAGNOSIS — R634 Abnormal weight loss: Secondary | ICD-10-CM | POA: Diagnosis not present

## 2022-04-15 DIAGNOSIS — K5909 Other constipation: Secondary | ICD-10-CM | POA: Diagnosis present

## 2022-04-15 DIAGNOSIS — E876 Hypokalemia: Secondary | ICD-10-CM | POA: Diagnosis not present

## 2022-04-15 DIAGNOSIS — Z888 Allergy status to other drugs, medicaments and biological substances status: Secondary | ICD-10-CM

## 2022-04-15 DIAGNOSIS — E739 Lactose intolerance, unspecified: Secondary | ICD-10-CM | POA: Diagnosis present

## 2022-04-15 DIAGNOSIS — M109 Gout, unspecified: Secondary | ICD-10-CM | POA: Diagnosis present

## 2022-04-15 DIAGNOSIS — K5732 Diverticulitis of large intestine without perforation or abscess without bleeding: Secondary | ICD-10-CM | POA: Diagnosis not present

## 2022-04-15 DIAGNOSIS — R1084 Generalized abdominal pain: Secondary | ICD-10-CM | POA: Diagnosis not present

## 2022-04-15 DIAGNOSIS — R32 Unspecified urinary incontinence: Secondary | ICD-10-CM | POA: Diagnosis present

## 2022-04-15 DIAGNOSIS — Z79899 Other long term (current) drug therapy: Secondary | ICD-10-CM

## 2022-04-15 DIAGNOSIS — M545 Low back pain, unspecified: Secondary | ICD-10-CM | POA: Diagnosis present

## 2022-04-15 DIAGNOSIS — Z8249 Family history of ischemic heart disease and other diseases of the circulatory system: Secondary | ICD-10-CM

## 2022-04-15 DIAGNOSIS — D696 Thrombocytopenia, unspecified: Secondary | ICD-10-CM | POA: Diagnosis not present

## 2022-04-15 DIAGNOSIS — E1142 Type 2 diabetes mellitus with diabetic polyneuropathy: Secondary | ICD-10-CM | POA: Diagnosis not present

## 2022-04-15 DIAGNOSIS — E039 Hypothyroidism, unspecified: Secondary | ICD-10-CM | POA: Diagnosis present

## 2022-04-15 DIAGNOSIS — Z87892 Personal history of anaphylaxis: Secondary | ICD-10-CM

## 2022-04-15 DIAGNOSIS — R935 Abnormal findings on diagnostic imaging of other abdominal regions, including retroperitoneum: Secondary | ICD-10-CM

## 2022-04-15 DIAGNOSIS — M419 Scoliosis, unspecified: Secondary | ICD-10-CM | POA: Diagnosis present

## 2022-04-15 DIAGNOSIS — R1032 Left lower quadrant pain: Secondary | ICD-10-CM | POA: Diagnosis not present

## 2022-04-15 DIAGNOSIS — R197 Diarrhea, unspecified: Secondary | ICD-10-CM | POA: Diagnosis not present

## 2022-04-15 DIAGNOSIS — Z833 Family history of diabetes mellitus: Secondary | ICD-10-CM

## 2022-04-15 DIAGNOSIS — I700 Atherosclerosis of aorta: Secondary | ICD-10-CM | POA: Diagnosis not present

## 2022-04-15 DIAGNOSIS — Z8613 Personal history of malaria: Secondary | ICD-10-CM

## 2022-04-15 DIAGNOSIS — M10071 Idiopathic gout, right ankle and foot: Secondary | ICD-10-CM | POA: Diagnosis not present

## 2022-04-15 DIAGNOSIS — Z803 Family history of malignant neoplasm of breast: Secondary | ICD-10-CM

## 2022-04-15 DIAGNOSIS — Z91013 Allergy to seafood: Secondary | ICD-10-CM

## 2022-04-15 HISTORY — DX: Gout, unspecified: M10.9

## 2022-04-15 HISTORY — DX: Diverticulitis of intestine, part unspecified, without perforation or abscess without bleeding: K57.92

## 2022-04-15 LAB — CBC WITH DIFFERENTIAL/PLATELET
Abs Immature Granulocytes: 0.03 10*3/uL (ref 0.00–0.07)
Basophils Absolute: 0 10*3/uL (ref 0.0–0.1)
Basophils Relative: 0 %
Eosinophils Absolute: 0 10*3/uL (ref 0.0–0.5)
Eosinophils Relative: 0 %
HCT: 41.7 % (ref 36.0–46.0)
Hemoglobin: 13.4 g/dL (ref 12.0–15.0)
Immature Granulocytes: 0 %
Lymphocytes Relative: 13 %
Lymphs Abs: 1.2 10*3/uL (ref 0.7–4.0)
MCH: 31.8 pg (ref 26.0–34.0)
MCHC: 32.1 g/dL (ref 30.0–36.0)
MCV: 98.8 fL (ref 80.0–100.0)
Monocytes Absolute: 0.8 10*3/uL (ref 0.1–1.0)
Monocytes Relative: 9 %
Neutro Abs: 6.9 10*3/uL (ref 1.7–7.7)
Neutrophils Relative %: 78 %
Platelets: 170 10*3/uL (ref 150–400)
RBC: 4.22 MIL/uL (ref 3.87–5.11)
RDW: 14.8 % (ref 11.5–15.5)
WBC: 8.9 10*3/uL (ref 4.0–10.5)
nRBC: 0 % (ref 0.0–0.2)

## 2022-04-15 LAB — COMPREHENSIVE METABOLIC PANEL
ALT: 12 U/L (ref 0–44)
AST: 22 U/L (ref 15–41)
Albumin: 3.1 g/dL — ABNORMAL LOW (ref 3.5–5.0)
Alkaline Phosphatase: 48 U/L (ref 38–126)
Anion gap: 14 (ref 5–15)
BUN: 12 mg/dL (ref 8–23)
CO2: 22 mmol/L (ref 22–32)
Calcium: 9.2 mg/dL (ref 8.9–10.3)
Chloride: 101 mmol/L (ref 98–111)
Creatinine, Ser: 0.63 mg/dL (ref 0.44–1.00)
GFR, Estimated: >60 mL/min (ref >60)
Glucose, Bld: 96 mg/dL (ref 70–99)
Potassium: 3.7 mmol/L (ref 3.5–5.1)
Sodium: 137 mmol/L (ref 135–145)
Total Bilirubin: 0.8 mg/dL (ref 0.3–1.2)
Total Protein: 7.1 g/dL (ref 6.5–8.1)

## 2022-04-15 LAB — URINALYSIS, ROUTINE W REFLEX MICROSCOPIC
Bilirubin Urine: NEGATIVE
Glucose, UA: NEGATIVE mg/dL
Hgb urine dipstick: NEGATIVE
Ketones, ur: 20 mg/dL — AB
Leukocytes,Ua: NEGATIVE
Nitrite: NEGATIVE
Protein, ur: NEGATIVE mg/dL
Specific Gravity, Urine: 1.02 (ref 1.005–1.030)
pH: 6 (ref 5.0–8.0)

## 2022-04-15 LAB — LACTIC ACID, PLASMA
Lactic Acid, Venous: 1.2 mmol/L (ref 0.5–1.9)
Lactic Acid, Venous: 0.8 mmol/L (ref 0.5–1.9)

## 2022-04-15 LAB — PROTIME-INR
INR: 1 (ref 0.8–1.2)
Prothrombin Time: 13.4 seconds (ref 11.4–15.2)

## 2022-04-15 LAB — APTT: aPTT: 26 seconds (ref 24–36)

## 2022-04-15 NOTE — ED Notes (Signed)
Urine Culture in main lab

## 2022-04-15 NOTE — ED Triage Notes (Signed)
Reports being discharged on Friday, reports her abdominal pain, fevers have returned, started last night. Reports urine and bowel incontinence. Saw PCP yesterday, they advised for her to return to ED as well as her GI MD. Tracy Hunt 2 IBU around 2:30pm, takes tylenol 3x daily but has not had this dose yet today. Did not take Creon today, did not eat lunch.

## 2022-04-15 NOTE — ED Provider Triage Note (Addendum)
Emergency Medicine Provider Triage Evaluation Note  Tracy Hunt , a 77 y.o. female  was evaluated in triage.  Pt complains of abdominal pain, fever, urinary and bowel incontinence.  Patient was discharged on Friday.  Saw PCP yesterday, they advised her to return to ED.  Patient has had weakness, lightheadedness, feeling off balance.  Has not had any falls.  Recently discharged from hospital for toxigenic C. Diff.   Review of Systems  Positive: As above Negative: As above  Physical Exam  BP 128/69 (BP Location: Right Arm)   Pulse (!) 107   Temp (!) 100.6 F (38.1 C) (Oral)   Resp 16   SpO2 96%  Gen:   Awake, no distress   Resp:  Normal effort  MSK:   Moves extremities without difficulty  Other:  Lower quadrant abdominal pain  Medical Decision Making  Medically screening exam initiated at 6:32 PM.  Appropriate orders placed.  [name] was informed that the remainder of the evaluation will be completed by another provider, this initial triage assessment does not replace that evaluation, and the importance of remaining in the ED until their evaluation is complete.     Theressa Stamps R, [place] 04/15/22 1837    Pat Kocher, [place] 04/15/22 [phone]

## 2022-04-16 ENCOUNTER — Other Ambulatory Visit: Payer: Self-pay

## 2022-04-16 ENCOUNTER — Encounter (HOSPITAL_COMMUNITY): Payer: Self-pay

## 2022-04-16 ENCOUNTER — Emergency Department (HOSPITAL_COMMUNITY): Payer: Medicare HMO

## 2022-04-16 DIAGNOSIS — M109 Gout, unspecified: Secondary | ICD-10-CM | POA: Diagnosis present

## 2022-04-16 DIAGNOSIS — R935 Abnormal findings on diagnostic imaging of other abdominal regions, including retroperitoneum: Secondary | ICD-10-CM | POA: Diagnosis not present

## 2022-04-16 DIAGNOSIS — R1084 Generalized abdominal pain: Secondary | ICD-10-CM | POA: Diagnosis not present

## 2022-04-16 DIAGNOSIS — K6289 Other specified diseases of anus and rectum: Secondary | ICD-10-CM | POA: Diagnosis present

## 2022-04-16 DIAGNOSIS — R1032 Left lower quadrant pain: Secondary | ICD-10-CM | POA: Diagnosis not present

## 2022-04-16 DIAGNOSIS — Z1152 Encounter for screening for COVID-19: Secondary | ICD-10-CM | POA: Diagnosis not present

## 2022-04-16 DIAGNOSIS — K5792 Diverticulitis of intestine, part unspecified, without perforation or abscess without bleeding: Secondary | ICD-10-CM | POA: Diagnosis not present

## 2022-04-16 DIAGNOSIS — K8689 Other specified diseases of pancreas: Secondary | ICD-10-CM | POA: Diagnosis present

## 2022-04-16 DIAGNOSIS — R109 Unspecified abdominal pain: Secondary | ICD-10-CM | POA: Diagnosis not present

## 2022-04-16 DIAGNOSIS — E785 Hyperlipidemia, unspecified: Secondary | ICD-10-CM

## 2022-04-16 DIAGNOSIS — I10 Essential (primary) hypertension: Secondary | ICD-10-CM

## 2022-04-16 DIAGNOSIS — E739 Lactose intolerance, unspecified: Secondary | ICD-10-CM | POA: Diagnosis present

## 2022-04-16 DIAGNOSIS — M3500 Sicca syndrome, unspecified: Secondary | ICD-10-CM | POA: Diagnosis present

## 2022-04-16 DIAGNOSIS — A0472 Enterocolitis due to Clostridium difficile, not specified as recurrent: Secondary | ICD-10-CM | POA: Diagnosis not present

## 2022-04-16 DIAGNOSIS — K5732 Diverticulitis of large intestine without perforation or abscess without bleeding: Secondary | ICD-10-CM | POA: Diagnosis not present

## 2022-04-16 DIAGNOSIS — M10071 Idiopathic gout, right ankle and foot: Secondary | ICD-10-CM | POA: Diagnosis not present

## 2022-04-16 DIAGNOSIS — D509 Iron deficiency anemia, unspecified: Secondary | ICD-10-CM | POA: Diagnosis present

## 2022-04-16 DIAGNOSIS — E1142 Type 2 diabetes mellitus with diabetic polyneuropathy: Secondary | ICD-10-CM | POA: Diagnosis present

## 2022-04-16 DIAGNOSIS — K5909 Other constipation: Secondary | ICD-10-CM | POA: Diagnosis present

## 2022-04-16 DIAGNOSIS — M545 Low back pain, unspecified: Secondary | ICD-10-CM | POA: Diagnosis present

## 2022-04-16 DIAGNOSIS — R509 Fever, unspecified: Secondary | ICD-10-CM

## 2022-04-16 DIAGNOSIS — E039 Hypothyroidism, unspecified: Secondary | ICD-10-CM | POA: Diagnosis present

## 2022-04-16 DIAGNOSIS — R933 Abnormal findings on diagnostic imaging of other parts of digestive tract: Secondary | ICD-10-CM

## 2022-04-16 DIAGNOSIS — E876 Hypokalemia: Secondary | ICD-10-CM | POA: Diagnosis not present

## 2022-04-16 DIAGNOSIS — R197 Diarrhea, unspecified: Secondary | ICD-10-CM | POA: Diagnosis not present

## 2022-04-16 DIAGNOSIS — Z79890 Hormone replacement therapy (postmenopausal): Secondary | ICD-10-CM | POA: Diagnosis not present

## 2022-04-16 DIAGNOSIS — R634 Abnormal weight loss: Secondary | ICD-10-CM | POA: Diagnosis not present

## 2022-04-16 DIAGNOSIS — D6959 Other secondary thrombocytopenia: Secondary | ICD-10-CM | POA: Diagnosis not present

## 2022-04-16 DIAGNOSIS — D696 Thrombocytopenia, unspecified: Secondary | ICD-10-CM | POA: Diagnosis not present

## 2022-04-16 DIAGNOSIS — R32 Unspecified urinary incontinence: Secondary | ICD-10-CM | POA: Diagnosis present

## 2022-04-16 DIAGNOSIS — D638 Anemia in other chronic diseases classified elsewhere: Secondary | ICD-10-CM | POA: Diagnosis present

## 2022-04-16 DIAGNOSIS — K219 Gastro-esophageal reflux disease without esophagitis: Secondary | ICD-10-CM | POA: Diagnosis present

## 2022-04-16 DIAGNOSIS — H409 Unspecified glaucoma: Secondary | ICD-10-CM | POA: Diagnosis present

## 2022-04-16 DIAGNOSIS — I700 Atherosclerosis of aorta: Secondary | ICD-10-CM | POA: Diagnosis not present

## 2022-04-16 DIAGNOSIS — M419 Scoliosis, unspecified: Secondary | ICD-10-CM | POA: Diagnosis present

## 2022-04-16 LAB — RESP PANEL BY RT-PCR (RSV, FLU A&B, COVID)  RVPGX2
Influenza A by PCR: NEGATIVE
Influenza B by PCR: NEGATIVE
Resp Syncytial Virus by PCR: NEGATIVE
SARS Coronavirus 2 by RT PCR: NEGATIVE

## 2022-04-16 LAB — BASIC METABOLIC PANEL
Anion gap: 8 (ref 5–15)
BUN: 12 mg/dL (ref 8–23)
CO2: 24 mmol/L (ref 22–32)
Calcium: 8.7 mg/dL — ABNORMAL LOW (ref 8.9–10.3)
Chloride: 103 mmol/L (ref 98–111)
Creatinine, Ser: 0.63 mg/dL (ref 0.44–1.00)
GFR, Estimated: >60 mL/min (ref >60)
Glucose, Bld: 87 mg/dL (ref 70–99)
Potassium: 3.9 mmol/L (ref 3.5–5.1)
Sodium: 135 mmol/L (ref 135–145)

## 2022-04-16 LAB — CBC
HCT: 39.9 % (ref 36.0–46.0)
Hemoglobin: 12.8 g/dL (ref 12.0–15.0)
MCH: 32.2 pg (ref 26.0–34.0)
MCHC: 32.1 g/dL (ref 30.0–36.0)
MCV: 100.3 fL — ABNORMAL HIGH (ref 80.0–100.0)
Platelets: UNDETERMINED 10*3/uL (ref 150–400)
RBC: 3.98 MIL/uL (ref 3.87–5.11)
RDW: 15.1 % (ref 11.5–15.5)
WBC: 6.4 10*3/uL (ref 4.0–10.5)
nRBC: 0 % (ref 0.0–0.2)

## 2022-04-16 LAB — TSH: TSH: 0.107 u[IU]/mL — ABNORMAL LOW (ref 0.350–4.500)

## 2022-04-16 LAB — C DIFFICILE QUICK SCREEN W PCR REFLEX
C Diff antigen: POSITIVE — AB
C Diff interpretation: DETECTED
C Diff toxin: POSITIVE — AB

## 2022-04-16 MED ORDER — POTASSIUM CHLORIDE IN NACL 20-0.9 MEQ/L-% IV SOLN
INTRAVENOUS | Status: AC
  Filled 2022-04-16: qty 1000

## 2022-04-16 MED ORDER — ENOXAPARIN SODIUM 40 MG/0.4ML IJ SOSY
40.0000 mg | PREFILLED_SYRINGE | INTRAMUSCULAR | Status: DC
  Administered 2022-04-16 – 2022-04-18 (×3): 40 mg via SUBCUTANEOUS
  Filled 2022-04-16 (×3): qty 0.4

## 2022-04-16 MED ORDER — PIPERACILLIN-TAZOBACTAM 3.375 G IVPB
3.3750 g | Freq: Three times a day (TID) | INTRAVENOUS | Status: DC
  Administered 2022-04-16 – 2022-04-17 (×5): 3.375 g via INTRAVENOUS
  Filled 2022-04-16 (×5): qty 50

## 2022-04-16 MED ORDER — TIMOLOL MALEATE 0.5 % OP SOLN
1.0000 [drp] | Freq: Two times a day (BID) | OPHTHALMIC | Status: DC
  Administered 2022-04-18 – 2022-04-22 (×9): 1 [drp] via OPHTHALMIC
  Filled 2022-04-16: qty 5

## 2022-04-16 MED ORDER — ROSUVASTATIN CALCIUM 10 MG PO TABS
10.0000 mg | ORAL_TABLET | Freq: Every day | ORAL | Status: DC
  Administered 2022-04-16 – 2022-04-17 (×2): 10 mg via ORAL
  Filled 2022-04-16 (×2): qty 1

## 2022-04-16 MED ORDER — SODIUM CHLORIDE 0.9 % IV SOLN
2.0000 g | Freq: Once | INTRAVENOUS | Status: AC
  Administered 2022-04-16: 2 g via INTRAVENOUS
  Filled 2022-04-16: qty 20

## 2022-04-16 MED ORDER — BRIMONIDINE TARTRATE-TIMOLOL 0.2-0.5 % OP SOLN: 1.0000 [drp] | Freq: Two times a day (BID) | OPHTHALMIC | Status: DC

## 2022-04-16 MED ORDER — GABAPENTIN 400 MG PO CAPS
400.0000 mg | ORAL_CAPSULE | Freq: Two times a day (BID) | ORAL | Status: DC
  Administered 2022-04-16 – 2022-04-18 (×5): 400 mg via ORAL
  Filled 2022-04-16 (×5): qty 1

## 2022-04-16 MED ORDER — IOHEXOL 300 MG/ML  SOLN
100.0000 mL | Freq: Once | INTRAMUSCULAR | Status: AC | PRN
  Administered 2022-04-16: 100 mL via INTRAVENOUS

## 2022-04-16 MED ORDER — BRIMONIDINE TARTRATE 0.2 % OP SOLN
1.0000 [drp] | Freq: Two times a day (BID) | OPHTHALMIC | Status: DC
  Administered 2022-04-18 – 2022-04-22 (×9): 1 [drp] via OPHTHALMIC
  Filled 2022-04-16 (×2): qty 5

## 2022-04-16 MED ORDER — PANCRELIPASE (LIP-PROT-AMYL) 12000-38000 UNITS PO CPEP
72000.0000 [IU] | ORAL_CAPSULE | Freq: Three times a day (TID) | ORAL | Status: DC
  Administered 2022-04-16 – 2022-04-22 (×20): 72000 [IU] via ORAL
  Filled 2022-04-16 (×6): qty 6
  Filled 2022-04-16: qty 2
  Filled 2022-04-16: qty 6
  Filled 2022-04-16: qty 2
  Filled 2022-04-16 (×8): qty 6
  Filled 2022-04-16: qty 2
  Filled 2022-04-16: qty 6
  Filled 2022-04-16: qty 2
  Filled 2022-04-16 (×2): qty 6

## 2022-04-16 MED ORDER — LEVOTHYROXINE SODIUM 112 MCG PO TABS
112.0000 ug | ORAL_TABLET | Freq: Every day | ORAL | Status: DC
  Administered 2022-04-16 – 2022-04-18 (×3): 112 ug via ORAL
  Filled 2022-04-16 (×3): qty 1

## 2022-04-16 MED ORDER — ACETAMINOPHEN 325 MG PO TABS
650.0000 mg | ORAL_TABLET | Freq: Four times a day (QID) | ORAL | Status: DC | PRN
  Administered 2022-04-16 – 2022-04-22 (×11): 650 mg via ORAL
  Filled 2022-04-16 (×12): qty 2

## 2022-04-16 MED ORDER — GABAPENTIN 100 MG PO CAPS: 100.0000 mg | ORAL_CAPSULE | Freq: Every day | ORAL | Status: DC

## 2022-04-16 MED ORDER — POLYVINYL ALCOHOL 1.4 % OP SOLN: 1.0000 [drp] | Freq: Two times a day (BID) | OPHTHALMIC | Status: DC | PRN

## 2022-04-16 MED ORDER — GABAPENTIN 100 MG PO CAPS
300.0000 mg | ORAL_CAPSULE | ORAL | Status: DC
  Administered 2022-04-16 – 2022-04-17 (×2): 300 mg via ORAL
  Filled 2022-04-16: qty 1
  Filled 2022-04-16: qty 3

## 2022-04-16 MED ORDER — METRONIDAZOLE 500 MG/100ML IV SOLN
500.0000 mg | Freq: Two times a day (BID) | INTRAVENOUS | Status: DC
  Administered 2022-04-16 (×2): 500 mg via INTRAVENOUS
  Filled 2022-04-16 (×2): qty 100

## 2022-04-16 MED ORDER — PREDNISONE 20 MG PO TABS
20.0000 mg | ORAL_TABLET | Freq: Every day | ORAL | Status: DC
  Administered 2022-04-16 – 2022-04-17 (×2): 20 mg via ORAL
  Filled 2022-04-16 (×2): qty 1

## 2022-04-16 MED ORDER — VANCOMYCIN HCL 125 MG PO CAPS
125.0000 mg | ORAL_CAPSULE | Freq: Four times a day (QID) | ORAL | Status: DC
  Administered 2022-04-16 – 2022-04-18 (×9): 125 mg via ORAL
  Filled 2022-04-16 (×11): qty 1

## 2022-04-16 MED ORDER — ONDANSETRON HCL 4 MG/2ML IJ SOLN
4.0000 mg | Freq: Four times a day (QID) | INTRAMUSCULAR | Status: DC | PRN
  Administered 2022-04-18: 4 mg via INTRAVENOUS
  Filled 2022-04-16: qty 2

## 2022-04-16 MED ORDER — LACTATED RINGERS IV BOLUS
1000.0000 mL | Freq: Once | INTRAVENOUS | Status: AC
  Administered 2022-04-16: 1000 mL via INTRAVENOUS

## 2022-04-16 MED ORDER — KETOROLAC TROMETHAMINE 15 MG/ML IJ SOLN
15.0000 mg | Freq: Once | INTRAMUSCULAR | Status: AC
  Administered 2022-04-16: 15 mg via INTRAVENOUS
  Filled 2022-04-16: qty 1

## 2022-04-16 MED ORDER — SENNA 8.6 MG PO TABS
1.0000 | ORAL_TABLET | Freq: Two times a day (BID) | ORAL | Status: DC
  Administered 2022-04-16 (×2): 8.6 mg via ORAL
  Filled 2022-04-16 (×3): qty 1

## 2022-04-16 MED ORDER — GABAPENTIN 300 MG PO CAPS: 300.0000 mg | ORAL_CAPSULE | Freq: Three times a day (TID) | ORAL | Status: DC

## 2022-04-16 MED ORDER — ESTRADIOL 0.5 MG PO TABS
1.0000 mg | ORAL_TABLET | Freq: Every day | ORAL | Status: DC
  Administered 2022-04-16: 0.5 mg via ORAL
  Administered 2022-04-17 – 2022-04-18 (×2): 1 mg via ORAL
  Filled 2022-04-16 (×4): qty 2

## 2022-04-16 MED ORDER — ONDANSETRON 4 MG PO TBDP
4.0000 mg | ORAL_TABLET | Freq: Four times a day (QID) | ORAL | Status: DC | PRN
  Administered 2022-04-16: 4 mg via ORAL
  Filled 2022-04-16: qty 1

## 2022-04-16 MED ORDER — ACETAMINOPHEN 650 MG RE SUPP: 650.0000 mg | Freq: Four times a day (QID) | RECTAL | Status: DC | PRN

## 2022-04-16 NOTE — Progress Notes (Signed)
-  Admitted earlier today. -As per H&P done earlier today: "This is a 77 year old female with past medical history of hypertension, Sjogren's syndrome, peripheral neuropathy, hypothyroidism, chronic constipation, glaucoma, gout and diverticulitis.  She was admitted 1/15- 1/19 after she had failed outpatient treatment for diabetic diverticulitis.  She was treated with IV Rocephin and Flagyl until she had clinical improvement.  She was discharged on Augmentin to complete the course.  After discharge, patient took the Augmentin until completion.  She was initially doing well done on Tuesday the day after finishing antibiotics she started feeling unwell.  She started having a mucus liquid incontinence.  Yesterday she went to her follow-up appointment with GI, getting ready completely wore her out.  Her temperature was checked, it was 100.6.  This morning she woke up weak, unable to get out of bed.  Her temperature was 102 and she had lots of shaking chills.  She contacted her PCP and GI, they both recommended her go to the ER.  She additionally complains of gout to the right ankle improved but still present and lower extremity swelling.   In the ER CT abdomen pelvis revealed. proctitis and sigmoid diverticulitis similar when compared with CT done 04/04/2022.   Large colonic stool load in the right and transverse colon.  Admission requested   History provided by patient and daughter at bedside"  04/16/2022: C. difficile came back positive.  Start patient on oral vancomycin.  Further management depend on hospital course.

## 2022-04-16 NOTE — ED Provider Notes (Signed)
[facility] Provider Note  CSN: [id number] Arrival date & time: 04/15/22 1754  Chief Complaint(s) Abdominal Pain  HPI Tracy Hunt is a 77 y.o. female with PMH HTN, hypothyroidism, Sjogren's syndrome, neuropathy, Bell's palsy, recent hospitalization on 04/04/2022 and discharged on 04/08/2022 for diverticulitis that failed outpatient antibiotics who presents emergency department for evaluation of abdominal pain, generalized malaise fatigue and fever.  Patient states that she was feeling well up until 3 days ago when her symptoms returned and she has had episodes of bowel and bladder incontinence with myalgias and fatigue.  She followed up with her outpatient PCP who is requesting that she return to the emergency department for further evaluation.  Currently endorses headache but denies chest pain, shortness of breath, nausea, vomiting or other systemic symptoms.  States that stools are loose but no bloody stools and no copious watery diarrhea.   Past Medical History Past Medical History:  Diagnosis Date   Anemia    Arthritis    Bell's palsy 6599   Complication of anesthesia    Hard to wake up after a  colonoscopy before  around 2000   Diverticulitis    Dry eye    Family history of adverse reaction to anesthesia    mom hard to wake up and had lethargy after anesthesia   Glaucoma    Gout    Hepatitis 1986   secondary to varicella was in hospital for 2 days   Hypertension    Hypothyroidism    Malaria    as as child   Mumps    PONV (postoperative nausea and vomiting)    Pre-diabetes    Rosacea    Scoliosis    Sjogren's syndrome ([place])    Patient Active Problem List   Diagnosis Date Noted   Diverticulitis 04/05/2022   Acute diverticulitis 04/04/2022   Abdominal pain 04/04/2022   HTN (hypertension) 04/04/2022   Hypokalemia 04/04/2022   Hypocalcemia 04/04/2022   Right foot pain 04/04/2022   Glaucoma 04/04/2022   Abnormality  of gait due to impairment of balance 06/22/2021   H/O total shoulder replacement, 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 Authorizing Provider  acetaminophen (TYLENOL) 650 MG CR tablet Take 1,300 mg by mouth in the morning, at noon, and at bedtime. Takes 2 tablets in the morning, 2 in the afternoon, and 2 at bedtime. 03/04/19   [provider]  amoxicillin-clavulanate (AUGMENTIN) 875-125 MG tablet Take 1 tablet by mouth every 12 (twelve) hours. 03/31/22   Audley Hose, MD  bismuth subsalicylate (PEPTO BISMOL) 262 MG chewable tablet Chew 524 mg by mouth as needed for indigestion or diarrhea or loose stools.    [provider]  brimonidine-timolol (COMBIGAN) 0.2-0.5 % ophthalmic solution Place 1 drop into both eyes every 12 (twelve) hours.    [provider]  Carboxymethylcellulose Sodium 1 % GEL Place 1 drop into both eyes at bedtime.    [provider]  Cholecalciferol (VITAMIN D) 50 MCG (2000 UT) tablet Take 2,000 Units by mouth daily.    [provider]  Cobalamin Combinations (B-12) 1000-400 MCG SUBL Place 1 tablet under the tongue daily.    [provider]  estradiol (ESTRACE) 1 MG tablet Take 1 mg  by mouth daily. 09/27/20   [provider]  gabapentin (NEURONTIN) 100 MG capsule Take 1 tablet in the morning and bedtime along with '300mg'$  tablets. Patient taking differently: Take 100 mg by mouth at bedtime. 01/21/22   Patel, Arvin Collard K, DO  gabapentin (NEURONTIN) 300 MG capsule Take 1 capsule (300 mg total) by mouth 3 (three) times daily. 01/21/22   Narda Amber K, DO  glycerin adult 2 g suppository Place 1 suppository rectally as needed for constipation. 03/16/22   Berton Mount, NP   hydroxypropyl methylcellulose / hypromellose (ISOPTO TEARS / GONIOVISC) 2.5 % ophthalmic solution Place 1 drop into both eyes daily.    [provider]  levothyroxine (SYNTHROID) 112 MCG tablet Take 112 mcg by mouth daily. 07/20/20   [provider]  lipase/protease/amylase (CREON) 36000 UNITS CPEP capsule Take 2 capsules (72,000 Units total) by mouth 3 (three) times daily with meals. 04/08/22 05/08/22  Barb Merino, MD  magnesium oxide (MAG-OX) 400 MG tablet Take 1 tablet (400 mg total) by mouth daily. 04/09/22 05/09/22  Barb Merino, MD  Menthol, Topical Analgesic, (BIOFREEZE EX) Apply 1 drop topically daily as needed (back pain).    [provider]  Multiple Vitamins-Minerals (CENTRUM SILVER 50+WOMEN PO) Take 1 tablet by mouth daily.    [provider]  omeprazole (PRILOSEC) 20 MG capsule Take 20 mg by mouth daily. 07/20/20   [provider]  potassium chloride SA (KLOR-CON M) 20 MEQ tablet Take 1 tablet (20 mEq total) by mouth daily for 14 days. 04/09/22 04/23/22  Barb Merino, MD  Propylene Glycol (SYSTANE COMPLETE OP) Place 1 drop into both eyes 2 (two) times daily as needed (dry eyes).    [provider]  psyllium (METAMUCIL) 58.6 % packet Take 1 packet by mouth daily as needed (constipation).    [provider]  rosuvastatin (CRESTOR) 10 MG tablet Take 10 mg by mouth at bedtime. 04/15/20   [provider]  simethicone (MYLICON) 086 MG chewable tablet Chew 125 mg by mouth every 6 (six) hours as needed for flatulence.    [provider]                                                                                                                                    Past Surgical History Past Surgical History:  Procedure Laterality Date   ABDOMINAL HYSTERECTOMY  1995   APPENDECTOMY  1995   CHOLECYSTECTOMY     EYE SURGERY Bilateral    cataract   JOINT REPLACEMENT  2012   Left knee   JOINT REPLACEMENT     right  knee 2017   REVERSE SHOULDER ARTHROPLASTY Left 10/23/2020   Procedure: REVERSE SHOULDER ARTHROPLASTY;  Surgeon: Netta Cedars, MD;  Location: [facility];  Service: Orthopedics;  Laterality: Left;  with ISB   steroid injections     L3,L4, L5   Family History Family History  Problem Relation Age  of Onset   Colon cancer Mother    Lung cancer Mother    Hypertension Mother    Diabetes Father    Breast cancer Paternal Grandmother     Social History Social History   Tobacco Use   Smoking status: Never   Smokeless tobacco: Never  Vaping Use   Vaping Use: Never used  Substance Use Topics   Alcohol use: Not Currently   Drug use: Never   Allergies Other, Oyster shell, and Shellfish allergy  Review of Systems Review of Systems  Constitutional:  Positive for chills, fatigue and fever.  Gastrointestinal:  Positive for abdominal pain.    Physical Exam Vital Signs  I have reviewed the triage vital signs BP (!) 113/52   Pulse 78   Temp 98.1 F (36.7 C) (Oral)   Resp 18   Ht '5\' 3"'$  (1.6 m)   Wt 70 kg   SpO2 95%   BMI 27.34 kg/m   Physical Exam Vitals and nursing note reviewed.  Constitutional:      General: She is not in acute distress.    Appearance: She is well-developed. She is ill-appearing.  HENT:     Head: Normocephalic and atraumatic.  Eyes:     Conjunctiva/sclera: Conjunctivae normal.  Cardiovascular:     Rate and Rhythm: Normal rate and regular rhythm.     Heart sounds: No murmur heard. Pulmonary:     Effort: Pulmonary effort is normal. No respiratory distress.     Breath sounds: Normal breath sounds.  Abdominal:     Palpations: Abdomen is soft.     Tenderness: There is abdominal tenderness in the left lower quadrant.  Musculoskeletal:        General: No swelling.     Cervical back: Neck supple.  Skin:    General: Skin is warm and dry.     Capillary Refill: Capillary refill takes less than 2 seconds.  Neurological:     Mental Status: She is alert.   Psychiatric:        Mood and Affect: Mood normal.     ED Results and Treatments Labs (all labs ordered are listed, but only abnormal results are displayed) Labs Reviewed  COMPREHENSIVE METABOLIC PANEL - Abnormal; Notable for the following components:      Result Value   Albumin 3.1 (*)    All other components within normal limits  URINALYSIS, ROUTINE W REFLEX MICROSCOPIC - Abnormal; Notable for the following components:   Ketones, ur 20 (*)    All other components within normal limits  RESP PANEL BY RT-PCR (RSV, FLU A&B, COVID)  RVPGX2  CULTURE, BLOOD (ROUTINE X 2)  CULTURE, BLOOD (ROUTINE X 2)  GASTROINTESTINAL PANEL BY PCR, STOOL (REPLACES STOOL CULTURE)  CBC WITH DIFFERENTIAL/PLATELET  LACTIC ACID, PLASMA  LACTIC ACID, PLASMA  PROTIME-INR  APTT  CBC  BASIC METABOLIC PANEL  TSH  Radiology CT ABDOMEN PELVIS W CONTRAST  Result Date: 04/16/2022 CLINICAL DATA:  Left lower quadrant abdominal pain. Fever and urinary incontinence. EXAM: CT ABDOMEN AND PELVIS WITH CONTRAST TECHNIQUE: Multidetector CT imaging of the abdomen and pelvis was performed using the standard protocol following bolus administration of intravenous contrast. RADIATION DOSE REDUCTION: This exam was performed according to the departmental dose-optimization program which includes automated exposure control, adjustment of the mA and/or kV according to patient size and/or use of iterative reconstruction technique. CONTRAST:  1108m OMNIPAQUE IOHEXOL 300 MG/ML  SOLN COMPARISON:  CT 04/04/2022 FINDINGS: Lower chest: Bibasilar atelectasis/scarring. Hepatobiliary: Cholecystectomy. No solid hepatic lesion. No biliary dilation. Pancreas: No acute abnormality. Spleen: No acute abnormality. Adrenals/Urinary Tract: No urinary calculi or hydronephrosis. Unremarkable bladder. Normal adrenal glands. Stomach/Bowel:  Stomach is nondistended. Normal caliber large and small bowel. Marked wall thickening and mucosal hyperenhancement of the sigmoid colon and rectum. Mild adjacent pericolonic fat stranding. There are a few sigmoid diverticula. Findings are similar to 04/04/2022. Large colonic stool load in the right and transverse colon. The appendix is not visualized. Vascular/Lymphatic: Aortic atherosclerosis. No enlarged abdominal or pelvic lymph nodes. Reproductive: Hysterectomy. Other: Small amount of free fluid in the pelvis. No free intraperitoneal air. Musculoskeletal: Thoracolumbar spondylosis. No acute osseous abnormality. IMPRESSION: Similar proctitis and sigmoid diverticulitis compared with 04/04/2022. No evidence of perforation or abscess. Consider follow-up colonoscopy following resolution of patient's acute symptoms to exclude underlying mass. Large colonic stool load in the right and transverse colon. Correlate for constipation. Aortic Atherosclerosis (ICD10-I70.0). Electronically Signed   By: TPlacido SouM.D.   On: 04/16/2022 02:21    Pertinent labs & imaging results that were available during my care of the patient were reviewed by me and considered in my medical decision making (see MDM for details).  Medications Ordered in ED Medications  metroNIDAZOLE (FLAGYL) IVPB 500 mg (500 mg Intravenous New Bag/Given 04/16/22 0421)  estradiol (ESTRACE) tablet 1 mg (has no administration in time range)  levothyroxine (SYNTHROID) tablet 112 mcg (has no administration in time range)  rosuvastatin (CRESTOR) tablet 10 mg (has no administration in time range)  lipase/protease/amylase (CREON) capsule 72,000 Units (has no administration in time range)  polyvinyl alcohol (LIQUIFILM TEARS) 1.4 % ophthalmic solution 1 drop (has no administration in time range)  brimonidine (ALPHAGAN) 0.2 % ophthalmic solution 1 drop (has no administration in time range)    And  timolol (TIMOPTIC) 0.5 % ophthalmic solution 1 drop (has  no administration in time range)  gabapentin (NEURONTIN) capsule 400 mg (has no administration in time range)  gabapentin (NEURONTIN) capsule 300 mg (has no administration in time range)  piperacillin-tazobactam (ZOSYN) IVPB 3.375 g (has no administration in time range)  enoxaparin (LOVENOX) injection 40 mg (has no administration in time range)  acetaminophen (TYLENOL) tablet 650 mg (has no administration in time range)    Or  acetaminophen (TYLENOL) suppository 650 mg (has no administration in time range)  senna (SENOKOT) tablet 8.6 mg (has no administration in time range)  0.9 % NaCl with KCl 20 mEq/ L  infusion (has no administration in time range)  predniSONE (DELTASONE) tablet 20 mg (has no administration in time range)  lactated ringers bolus 1,000 mL (0 mLs Intravenous Stopped 04/16/22 0250)  ketorolac (TORADOL) 15 MG/ML injection 15 mg (15 mg Intravenous Given 04/16/22 0118)  iohexol (OMNIPAQUE) 300 MG/ML solution 100 mL (100 mLs Intravenous Contrast Given 04/16/22 0155)  cefTRIAXone (ROCEPHIN) 2 g in sodium chloride 0.9 % 100 mL IVPB (2 g Intravenous  New Bag/Given 04/16/22 0420)                                                                                                                                     Procedures Procedures  (including critical care time)  Medical Decision Making / ED Course   This patient presents to the ED for concern of abdominal pain, fever, diarrhea, this involves an extensive number of treatment options, and is a complaint that carries with it a high risk of complications and morbidity.  The differential diagnosis includes diverticulitis, perforation, abscess, influenza, COVID-19, RSV, UTI  MDM: Patient seen emerged part for evaluation of fever and abdominal pain.  Physical exam with left lower quadrant tenderness to palpation but is otherwise unremarkable.  Laboratory evaluation is unremarkable with no send leukocytosis.  Urinalysis unremarkable.  COVID  flu RSV all negative.  CT abdomen pelvis showing persistent diverticulitis and proctitis.  Fever improved and patient fluid resuscitated, but with persistent diverticulitis despite antibiotic therapy and patient currently symptomatic, she will require hospitalization for IV antibiotics.  Ceftriaxone Flagyl initiated and patient admitted.   Additional history obtained: -Additional history obtained from daughter -External records from outside source obtained and reviewed including: Chart review including previous notes, labs, imaging, consultation notes   Lab Tests: -I ordered, reviewed, and interpreted labs.   The pertinent results include:   Labs Reviewed  COMPREHENSIVE METABOLIC PANEL - Abnormal; Notable for the following components:      Result Value   Albumin 3.1 (*)    All other components within normal limits  URINALYSIS, ROUTINE W REFLEX MICROSCOPIC - Abnormal; Notable for the following components:   Ketones, ur 20 (*)    All other components within normal limits  RESP PANEL BY RT-PCR (RSV, FLU A&B, COVID)  RVPGX2  CULTURE, BLOOD (ROUTINE X 2)  CULTURE, BLOOD (ROUTINE X 2)  GASTROINTESTINAL PANEL BY PCR, STOOL (REPLACES STOOL CULTURE)  CBC WITH DIFFERENTIAL/PLATELET  LACTIC ACID, PLASMA  LACTIC ACID, PLASMA  PROTIME-INR  APTT  CBC  BASIC METABOLIC PANEL  TSH      EKG   EKG Interpretation  Date/Time:  Friday April 15 2022 19:53:47 EST Ventricular Rate:  97 PR Interval:  152 QRS Duration: 82 QT Interval:  342 QTC Calculation: 435 R Axis:   -39 Text Interpretation: Sinus rhythm Left atrial enlargement Inferior infarct, old Consider anterior infarct Confirmed by Jerie Basford ([id number]) on 04/16/2022 5:45:47 AM         Imaging Studies ordered: I ordered imaging studies including CT abdomen pelvis I independently visualized and interpreted imaging. I agree with the radiologist interpretation   Medicines ordered and prescription drug management: Meds ordered  this encounter  Medications   lactated ringers bolus 1,000 mL   ketorolac (TORADOL) 15 MG/ML injection 15 mg   iohexol (OMNIPAQUE) 300 MG/ML solution 100 mL   cefTRIAXone (ROCEPHIN) 2 g in sodium chloride 0.9 % 100 mL IVPB  Order Specific Question:   Antibiotic Indication:    Answer:   Intra-abdominal   metroNIDAZOLE (FLAGYL) IVPB 500 mg    Order Specific Question:   Antibiotic Indication:    Answer:   Intra-abdominal Infection   estradiol (ESTRACE) tablet 1 mg   DISCONTD: gabapentin (NEURONTIN) capsule 100 mg    Take 1 tablet in the morning and bedtime along with '300mg'$  tablets.     DISCONTD: gabapentin (NEURONTIN) capsule 300 mg   levothyroxine (SYNTHROID) tablet 112 mcg   rosuvastatin (CRESTOR) tablet 10 mg   DISCONTD: brimonidine-timolol (COMBIGAN) 0.2-0.5 % ophthalmic solution 1 drop   lipase/protease/amylase (CREON) capsule 72,000 Units   polyvinyl alcohol (LIQUIFILM TEARS) 1.4 % ophthalmic solution 1 drop   AND Linked Order Group    brimonidine (ALPHAGAN) 0.2 % ophthalmic solution 1 drop    timolol (TIMOPTIC) 0.5 % ophthalmic solution 1 drop   gabapentin (NEURONTIN) capsule 400 mg   gabapentin (NEURONTIN) capsule 300 mg   piperacillin-tazobactam (ZOSYN) IVPB 3.375 g    Order Specific Question:   Antibiotic Indication:    Answer:   Intra-abdominal Infection   enoxaparin (LOVENOX) injection 40 mg   OR Linked Order Group    acetaminophen (TYLENOL) tablet 650 mg    acetaminophen (TYLENOL) suppository 650 mg   senna (SENOKOT) tablet 8.6 mg   0.9 % NaCl with KCl 20 mEq/ L  infusion   predniSONE (DELTASONE) tablet 20 mg    -I have reviewed the patients home medicines and have made adjustments as needed  Critical interventions none  Cardiac Monitoring: The patient was maintained on a cardiac monitor.  I personally viewed and interpreted the cardiac monitored which showed an underlying rhythm of: NSR  Social Determinants of Health:  Factors impacting patients care include:  none   Reevaluation: After the interventions noted above, I reevaluated the patient and found that they have :improved  Co morbidities that complicate the patient evaluation  Past Medical History:  Diagnosis Date   Anemia    Arthritis    Bell's palsy 2500   Complication of anesthesia    Hard to wake up after a  colonoscopy before  around 2000   Diverticulitis    Dry eye    Family history of adverse reaction to anesthesia    mom hard to wake up and had lethargy after anesthesia   Glaucoma    Gout    Hepatitis 1986   secondary to varicella was in hospital for 2 days   Hypertension    Hypothyroidism    Malaria    as as child   Mumps    PONV (postoperative nausea and vomiting)    Pre-diabetes    Rosacea    Scoliosis    Sjogren's syndrome ([place])       Dispostion: I considered admission for this patient, and due to persistent diverticulitis despite appropriate antibiotic therapy in the outpatient setting patient require hospitalization     Final Clinical Impression(s) / ED Diagnoses Final diagnoses:  Diverticulitis     '@PCDICTATION'$ @    Teressa Lower, MD 04/16/22 [phone]

## 2022-04-16 NOTE — Progress Notes (Signed)
Pharmacy Antibiotic Note  Tracy Hunt is a 77 y.o. female admitted on 04/15/2022 with abdominal pain, fevers have returned, started last night. Reports urine and bowel incontinence .  Pharmacy has been consulted for zosyn dosing.  Plan: Zosyn 3.375g IV Q8H infused over 4hrs. Pharmacy will sign off and follow peripherally  Height: '5\' 3"'$  (160 cm) Weight: 70 kg (154 lb 5.2 oz) IBW/kg (Calculated) : 52.4  Temp (24hrs), Avg:99.5 F (37.5 C), Min:98.3 F (36.8 C), Max:100.6 F (38.1 C)  Recent Labs  Lab 04/15/22 1926 04/15/22 1936 04/15/22 2252  WBC 8.9  --   --   CREATININE 0.63  --   --   LATICACIDVEN  --  1.2 0.8    Estimated Creatinine Clearance: 56.1 mL/min (by C-G formula based on SCr of 0.63 mg/dL).    Allergies  Allergen Reactions   Other Itching    Adhesive tape    Oyster Shell Hives   Shellfish Allergy Nausea And Vomiting    Oysters     Thank you for allowing pharmacy to be a part of this patient's care. Dolly Rias RPh 04/16/2022, 4:19 AM

## 2022-04-16 NOTE — Consult Note (Addendum)
Consult Note Covering for Drs. 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  Rule out C. difficile. Pancreatic insufficiency, recently diagnosed Elevated fecal calprotectin Anemia Alternating diarrhea and constipation   Recommendations    Stool for enteric pathogens and C. difficile toxin Agree with IV Zosyn Continue pancreatic enzymes with meals Consider flexible sigmoidoscopy or colonoscopy to further evaluate  Dr. Benson Norway to resume GI care Monday   HPI: Tracy Hunt is a 77 y.o. female followed by Dr. Benson Norway with a history of alternating bowel habits and steady weight loss.  She is Dr. Lind Covert MIL. She was seen in consultation by Dr. Oren Beckmann, [facility], in early January 2024 with the following results: fecal calprotectin elevated at 610, fecal elastase low at 67, C diff Ag positive, C. diff toxin negative.  She was started on pancreatic enzymes.  She developed lower abdominal pain then hospitalized on January 15 and was diagnosed with diverticulitis.  She was treated with IV Rocephin and Flagyl and then discharged home on Augmentin.  She finished Augmentin on Tuesday.  Yesterday she developed fevers and chills at home with worsening left lower quadrant pain and looser stools.  Her last colonoscopy was performed in 2021 by Dr. Benson Norway and he reports the only finding was diverticulosis.  Records not available for review. CT AP earlier today shows marked wall thickening and mucosal enhancement of the sigmoid colon and rectum with mild adjacent pericolonic fat stranding and a few sigmoid colon diverticula.  There is a large colonic stool burden in the right and transverse colon. Denies change in stool caliber,  melena, hematochezia, vomiting, dysphagia, reflux symptoms, chest pain.    Past Medical History:  Diagnosis Date   Anemia    Arthritis    Bell's palsy 4270   Complication of anesthesia    Hard to wake up after a  colonoscopy before  around 2000   Diverticulitis    Dry eye    Family history of adverse reaction to anesthesia    mom hard to wake up and had lethargy after anesthesia   Glaucoma    Gout    Hepatitis 1986   secondary to varicella was in hospital for 2 days   Hypertension    Hypothyroidism    Malaria    as as child   Mumps    PONV (postoperative nausea and vomiting)    Pre-diabetes    Rosacea    Scoliosis    Sjogren's syndrome ([place])     Past Surgical History:  Procedure Laterality Date   [place] Bilateral    cataract   JOINT REPLACEMENT  2012   Left knee   JOINT REPLACEMENT     right knee 2017   REVERSE SHOULDER ARTHROPLASTY Left 10/23/2020   Procedure: REVERSE SHOULDER ARTHROPLASTY;  Surgeon: Netta Cedars, MD;  Location: [facility];  Service: Orthopedics;  Laterality: Left;  with ISB   steroid injections     L3,L4, L5    Prior to Admission medications   Medication Sig Start Date End Date Taking? Authorizing Provider  acetaminophen (TYLENOL) 650 MG CR tablet Take 1,300 mg by mouth in the morning, at noon, and at  bedtime. Takes 2 tablets in the morning, 2 in the afternoon, and 2 at bedtime. 03/04/19   [provider]  amoxicillin-clavulanate (AUGMENTIN) 875-125 MG tablet Take 1 tablet by mouth every 12 (twelve) hours. 03/31/22   Audley Hose, MD  bismuth subsalicylate (PEPTO BISMOL) 262 MG chewable tablet Chew 524 mg by mouth as needed for indigestion or diarrhea or loose stools.    [provider]  brimonidine-timolol (COMBIGAN) 0.2-0.5 % ophthalmic solution Place 1 drop into both eyes every 12 (twelve) hours.    [provider]  Carboxymethylcellulose  Sodium 1 % GEL Place 1 drop into both eyes at bedtime.    [provider]  Cholecalciferol (VITAMIN D) 50 MCG (2000 UT) tablet Take 2,000 Units by mouth daily.    [provider]  Cobalamin Combinations (B-12) 1000-400 MCG SUBL Place 1 tablet under the tongue daily.    [provider]  estradiol (ESTRACE) 1 MG tablet Take 1 mg by mouth daily. 09/27/20   [provider]  gabapentin (NEURONTIN) 100 MG capsule Take 1 tablet in the morning and bedtime along with '300mg'$  tablets. Patient taking differently: Take 100 mg by mouth at bedtime. 01/21/22   Patel, Arvin Collard K, DO  gabapentin (NEURONTIN) 300 MG capsule Take 1 capsule (300 mg total) by mouth 3 (three) times daily. 01/21/22   Narda Amber K, DO  glycerin adult 2 g suppository Place 1 suppository rectally as needed for constipation. 03/16/22   Berton Mount, NP  hydroxypropyl methylcellulose / hypromellose (ISOPTO TEARS / GONIOVISC) 2.5 % ophthalmic solution Place 1 drop into both eyes daily.    [provider]  levothyroxine (SYNTHROID) 112 MCG tablet Take 112 mcg by mouth daily. 07/20/20   [provider]  lipase/protease/amylase (CREON) 36000 UNITS CPEP capsule Take 2 capsules (72,000 Units total) by mouth 3 (three) times daily with meals. 04/08/22 05/08/22  Barb Merino, MD  magnesium oxide (MAG-OX) 400 MG tablet Take 1 tablet (400 mg total) by mouth daily. 04/09/22 05/09/22  Barb Merino, MD  Menthol, Topical Analgesic, (BIOFREEZE EX) Apply 1 drop topically daily as needed (back pain).    [provider]  Multiple Vitamins-Minerals (CENTRUM SILVER 50+WOMEN PO) Take 1 tablet by mouth daily.    [provider]  omeprazole (PRILOSEC) 20 MG capsule Take 20 mg by mouth daily. 07/20/20   [provider]  potassium chloride SA (KLOR-CON M) 20 MEQ tablet Take 1 tablet (20 mEq total) by mouth daily for 14 days. 04/09/22 04/23/22  Barb Merino, MD  Propylene Glycol (SYSTANE COMPLETE  OP) Place 1 drop into both eyes 2 (two) times daily as needed (dry eyes).    [provider]  psyllium (METAMUCIL) 58.6 % packet Take 1 packet by mouth daily as needed (constipation).    [provider]  rosuvastatin (CRESTOR) 10 MG tablet Take 10 mg by mouth at bedtime. 04/15/20   [provider]  simethicone (MYLICON) 188 MG chewable tablet Chew 125 mg by mouth every 6 (six) hours as needed for flatulence.    [provider]    Current Facility-Administered Medications  Medication Dose Route Frequency Provider Last Rate Last Admin   0.9 % NaCl with KCl 20 mEq/ L  infusion   Intravenous Continuous Quintella Baton, MD 75 mL/hr at 04/16/22 0603 New Bag at 04/16/22 0603   acetaminophen (TYLENOL) tablet 650 mg  650 mg Oral Q6H PRN Quintella Baton, MD   650 mg at 04/16/22 4166   Or  acetaminophen (TYLENOL) suppository 650 mg  650 mg Rectal Q6H PRN Crosley, Debby, MD       brimonidine (ALPHAGAN) 0.2 % ophthalmic solution 1 drop  1 drop Both Eyes BID Crosley, Debby, MD       And   timolol (TIMOPTIC) 0.5 % ophthalmic solution 1 drop  1 drop Both Eyes BID Crosley, Debby, MD       enoxaparin (LOVENOX) injection 40 mg  40 mg Subcutaneous Q24H Crosley, Debby, MD       estradiol (ESTRACE) tablet 1 mg  1 mg Oral Daily Crosley, Debby, MD       gabapentin (NEURONTIN) capsule 300 mg  300 mg Oral Q24H Crosley, Debby, MD       gabapentin (NEURONTIN) capsule 400 mg  400 mg Oral BID Crosley, Debby, MD       levothyroxine (SYNTHROID) tablet 112 mcg  112 mcg Oral Q0600 Quintella Baton, MD   112 mcg at 04/16/22 0650   lipase/protease/amylase (CREON) capsule 72,000 Units  72,000 Units Oral TID WC Crosley, Debby, MD       metroNIDAZOLE (FLAGYL) IVPB 500 mg  500 mg Intravenous Q12H Kommor, Madison, MD   Stopped at 04/16/22 0546   ondansetron (ZOFRAN-ODT) disintegrating tablet 4 mg  4 mg Oral Q6H PRN Dana Allan I, MD   4 mg at 04/16/22 0847   Or   ondansetron (ZOFRAN) injection 4  mg  4 mg Intravenous Q6H PRN Dana Allan I, MD       piperacillin-tazobactam (ZOSYN) IVPB 3.375 g  3.375 g Intravenous Q8H Crosley, Debby, MD 12.5 mL/hr at 04/16/22 0607 3.375 g at 04/16/22 9678   polyvinyl alcohol (LIQUIFILM TEARS) 1.4 % ophthalmic solution 1 drop  1 drop Both Eyes BID PRN Crosley, Debby, MD       predniSONE (DELTASONE) tablet 20 mg  20 mg Oral Q breakfast Crosley, Debby, MD   20 mg at 04/16/22 0850   rosuvastatin (CRESTOR) tablet 10 mg  10 mg Oral QHS Crosley, Debby, MD       senna (SENOKOT) tablet 8.6 mg  1 tablet Oral BID Quintella Baton, MD       Current Outpatient Medications  Medication Sig Dispense Refill   acetaminophen (TYLENOL) 650 MG CR tablet Take 1,300 mg by mouth in the morning, at noon, and at bedtime. Takes 2 tablets in the morning, 2 in the afternoon, and 2 at bedtime.     amoxicillin-clavulanate (AUGMENTIN) 875-125 MG tablet Take 1 tablet by mouth every 12 (twelve) hours. 14 tablet 0   bismuth subsalicylate (PEPTO BISMOL) 262 MG chewable tablet Chew 524 mg by mouth as needed for indigestion or diarrhea or loose stools.     brimonidine-timolol (COMBIGAN) 0.2-0.5 % ophthalmic solution Place 1 drop into both eyes every 12 (twelve) hours.     Carboxymethylcellulose Sodium 1 % GEL Place 1 drop into both eyes at bedtime.     Cholecalciferol (VITAMIN D) 50 MCG (2000 UT) tablet Take 2,000 Units by mouth daily.     Cobalamin Combinations (B-12) 1000-400 MCG SUBL Place 1 tablet under the tongue daily.     estradiol (ESTRACE) 1 MG tablet Take 1 mg by mouth daily.     gabapentin (NEURONTIN) 100 MG capsule Take 1 tablet in the morning and bedtime along with '300mg'$  tablets. (Patient taking differently: Take 100 mg by mouth at bedtime.) 180 capsule 3   gabapentin (NEURONTIN) 300 MG capsule Take 1 capsule (300 mg total) by mouth 3 (three) times daily. 480 capsule  1   glycerin adult 2 g suppository Place 1 suppository rectally as needed for constipation. 12 suppository 0    hydroxypropyl methylcellulose / hypromellose (ISOPTO TEARS / GONIOVISC) 2.5 % ophthalmic solution Place 1 drop into both eyes daily.     levothyroxine (SYNTHROID) 112 MCG tablet Take 112 mcg by mouth daily.     lipase/protease/amylase (CREON) 36000 UNITS CPEP capsule Take 2 capsules (72,000 Units total) by mouth 3 (three) times daily with meals. 180 capsule 0   magnesium oxide (MAG-OX) 400 MG tablet Take 1 tablet (400 mg total) by mouth daily. 30 tablet 0   Menthol, Topical Analgesic, (BIOFREEZE EX) Apply 1 drop topically daily as needed (back pain).     Multiple Vitamins-Minerals (CENTRUM SILVER 50+WOMEN PO) Take 1 tablet by mouth daily.     omeprazole (PRILOSEC) 20 MG capsule Take 20 mg by mouth daily.     potassium chloride SA (KLOR-CON M) 20 MEQ tablet Take 1 tablet (20 mEq total) by mouth daily for 14 days. 14 tablet 0   Propylene Glycol (SYSTANE COMPLETE OP) Place 1 drop into both eyes 2 (two) times daily as needed (dry eyes).     psyllium (METAMUCIL) 58.6 % packet Take 1 packet by mouth daily as needed (constipation).     rosuvastatin (CRESTOR) 10 MG tablet Take 10 mg by mouth at bedtime.     simethicone (MYLICON) 710 MG chewable tablet Chew 125 mg by mouth every 6 (six) hours as needed for flatulence.      Allergies as of 04/15/2022 - Review Complete 04/05/2022  Allergen Reaction Noted   Other Itching 10/12/2020   Oyster shell Hives 10/14/2020   Shellfish allergy Nausea And Vomiting 10/07/2020    Family History  Problem Relation Age of Onset   Colon cancer Mother    Lung cancer Mother    Hypertension Mother    Diabetes Father    Breast cancer Paternal Grandmother     Social History   Socioeconomic History   Marital status: Widowed    Spouse name: Not on file   Number of children: 2   Years of education: Not on file   Highest education level: Not on file  Occupational History   Not on file  Tobacco Use   Smoking status: Never   Smokeless tobacco: Never  Vaping Use    Vaping Use: Never used  Substance and Sexual Activity   Alcohol use: Not Currently   Drug use: Never   Sexual activity: Not Currently  Other Topics Concern   Not on file  Social History Narrative   Right Handed    Lives in a 4 story home with a basement. Only uses the first story.          Son is a Tax adviser and daughter is a Curator Strain: Not on file  Food Insecurity: No Food Insecurity (04/05/2022)   Hunger Vital Sign    Worried About Running Out of Food in the Last Year: Never true    Ran Out of Food in the Last Year: Never true  Transportation Needs: No Transportation Needs (04/05/2022)   PRAPARE - Hydrologist (Medical): No    Lack of Transportation (Non-Medical): No  Physical Activity: Not on file  Stress: Not on file  Social Connections: Not on file  Intimate Partner Violence: Not At Risk (04/05/2022)   Humiliation, Afraid, Rape, and Kick questionnaire    Fear of  Current or Ex-Partner: No    Emotionally Abused: No    Physically Abused: No    Sexually Abused: No    Review of Systems: Gen: Denies any fever, chills, sweats, anorexia, fatigue, weakness, malaise, weight loss, and sleep disorder CV: Denies chest pain, angina, palpitations, syncope, orthopnea, PND, peripheral edema, and claudication. Resp: Denies dyspnea at rest, dyspnea with exercise, cough, sputum, wheezing, coughing up blood, and pleurisy. GI: Denies vomiting blood, jaundice, and fecal incontinence.   Denies dysphagia or odynophagia. GU : Denies urinary burning, blood in urine, urinary frequency, urinary hesitancy, nocturnal urination, and urinary incontinence. MS: Denies joint pain, limitation of movement, and swelling, stiffness, low back pain, extremity pain. Denies muscle weakness, cramps, atrophy.  Derm: Denies rash, itching, dry skin, hives, moles, warts, or unhealing ulcers.  Psych: Denies depression, anxiety, memory loss,  suicidal ideation, hallucinations, paranoia, and confusion. Heme: Denies bruising, bleeding, and enlarged lymph nodes. Neuro:  Denies any headaches, dizziness, paresthesias. Endo:  Denies any problems with DM, thyroid, adrenal function.  Physical Exam: Vital signs in last 24 hours: Temp:  [98.1 F (36.7 C)-101.2 F (38.4 C)] 101.2 F (38.4 C) (01/27 0814) Pulse Rate:  [72-107] 86 (01/27 0745) Resp:  [16-18] 18 (01/27 0700) BP: (97-157)/(52-87) 157/87 (01/27 0745) SpO2:  [95 %-99 %] 95 % (01/27 0745) Weight:  [70 kg] 70 kg (01/27 0011)    General:  Alert, well-developed, well-nourished, elderly, acutely ill appearing Head:  Normocephalic and atraumatic. Eyes:  Sclera clear, no icterus. Conjunctiva pink. Ears:  Normal auditory acuity. Nose:  No deformity, discharge, or lesions. Mouth:  No deformity or lesions. Oropharynx pink & moist. Neck:  Supple; no masses or thyromegaly. Chest:  Clear throughout to auscultation. No wheezes, crackles, or rhonchi. No acute distress. Heart:  Regular rate and rhythm; no murmurs, clicks, rubs, or gallops. Abdomen:  Soft, LLQ tenderness and nondistended. No masses, hepatosplenomegaly or hernias noted. Normal bowel sounds, without guarding, and without rebound.   Rectal:  Deferred   Msk:  Symmetrical without gross deformities. Normal posture. Pulses:  Normal pulses noted. Extremities:  Without clubbing or edema. Neurologic:  Alert and  oriented x4;  grossly normal neurologically. Skin:  Intact without significant lesions or rashes. Cervical Nodes:  No significant cervical adenopathy. Psych:  Alert and cooperative. Normal mood and affect.  Intake/Output from previous day: 01/26 0701 - 01/27 0700 In: 999 [IV Piggyback:999] Out: -  Intake/Output this shift: No intake/output data recorded.  Previous Endoscopies:  Colon/EGD 2021 Dr. Benson Norway: only diverticulosis per verbal report   Lab Results: Recent Labs    04/15/22 1926  WBC 8.9  HGB 13.4   HCT 41.7  PLT 170   BMET Recent Labs    04/15/22 1926 04/16/22 0433  NA 137 135  K 3.7 3.9  CL 101 103  CO2 22 24  GLUCOSE 96 87  BUN 12 12  CREATININE 0.63 0.63  CALCIUM 9.2 8.7*   LFT Recent Labs    04/15/22 1926  PROT 7.1  ALBUMIN 3.1*  AST 22  ALT 12  ALKPHOS 48  BILITOT 0.8   PT/INR Recent Labs    04/15/22 1926  LABPROT 13.4  INR 1.0   Hepatitis Panel No results for input(s): "HEPBSAG", "HCVAB", "HEPAIGM", "HEPBIGM" in the last 72 hours. C-Diff   Studies/Results: CT ABDOMEN PELVIS W CONTRAST  Result Date: 04/16/2022 CLINICAL DATA:  Left lower quadrant abdominal pain. Fever and urinary incontinence. EXAM: CT ABDOMEN AND PELVIS WITH CONTRAST TECHNIQUE: Multidetector CT imaging of the  abdomen and pelvis was performed using the standard protocol following bolus administration of intravenous contrast. RADIATION DOSE REDUCTION: This exam was performed according to the departmental dose-optimization program which includes automated exposure control, adjustment of the mA and/or kV according to patient size and/or use of iterative reconstruction technique. CONTRAST:  169m OMNIPAQUE IOHEXOL 300 MG/ML  SOLN COMPARISON:  CT 04/04/2022 FINDINGS: Lower chest: Bibasilar atelectasis/scarring. Hepatobiliary: Cholecystectomy. No solid hepatic lesion. No biliary dilation. Pancreas: No acute abnormality. Spleen: No acute abnormality. Adrenals/Urinary Tract: No urinary calculi or hydronephrosis. Unremarkable bladder. Normal adrenal glands. Stomach/Bowel: Stomach is nondistended. Normal caliber large and small bowel. Marked wall thickening and mucosal hyperenhancement of the sigmoid colon and rectum. Mild adjacent pericolonic fat stranding. There are a few sigmoid diverticula. Findings are similar to 04/04/2022. Large colonic stool load in the right and transverse colon. The appendix is not visualized. Vascular/Lymphatic: Aortic atherosclerosis. No enlarged abdominal or pelvic lymph  nodes. Reproductive: Hysterectomy. Other: Small amount of free fluid in the pelvis. No free intraperitoneal air. Musculoskeletal: Thoracolumbar spondylosis. No acute osseous abnormality. IMPRESSION: Similar proctitis and sigmoid diverticulitis compared with 04/04/2022. No evidence of perforation or abscess. Consider follow-up colonoscopy following resolution of patient's acute symptoms to exclude underlying mass. Large colonic stool load in the right and transverse colon. Correlate for constipation. Aortic Atherosclerosis (ICD10-I70.0). Electronically Signed   By: TPlacido SouM.D.   On: 04/16/2022 02:21      LOS: 0 days   Pasquale Matters T. SFuller Plan MD  04/16/2022, 8:57 AM See AShea Evans [facility], to contact our on call provider

## 2022-04-16 NOTE — H&P (Signed)
PCP:   Prince Solian, MD   Chief Complaint:  , Pain  HPI: This is a 77 year old female with past medical history of hypertension, Sjogren's syndrome, peripheral neuropathy, hypothyroidism, chronic constipation, glaucoma, gout and diverticulitis.  She was admitted 1/15- 1/19 after she had failed outpatient treatment for diabetic diverticulitis.  She was treated with IV Rocephin and Flagyl until she had clinical improvement.  She was discharged on Augmentin to complete the course.  After discharge, patient took the Augmentin until completion.  She was initially doing well done on Tuesday the day after finishing antibiotics she started feeling unwell.  She started having a mucus liquid incontinence.  Yesterday she went to her follow-up appointment with GI, getting ready completely wore her out.  Her temperature was checked, it was 100.6.  This morning she woke up weak, unable to get out of bed.  Her temperature was 102 and she had lots of shaking chills.  She contacted her PCP and GI, they both recommended her go to the ER.  She additionally complains of gout to the right ankle improved but still present and lower extremity swelling.  In the ER CT abdomen pelvis revealed. proctitis and sigmoid diverticulitis similar when compared with CT done 04/04/2022.   Large colonic stool load in the right and transverse colon.  Admission requested  History provided by patient and daughter at bedside  Review of Systems:  The patient denies anorexia, fever, weight loss,, vision loss, decreased hearing, hoarseness, chest pain, syncope, dyspnea on exertion, peripheral edema, balance deficits, hemoptysis, abdominal pain, melena, hematochezia, severe indigestion/heartburn, hematuria, incontinence, genital sores, muscle weakness, suspicious skin lesions, transient blindness, difficulty walking, depression, unusual weight change, abnormal bleeding, enlarged lymph nodes, angioedema, and breast masses. Positives: Fever,  weakness, lethargy  Past Medical History: Past Medical History:  Diagnosis Date   Anemia    Arthritis    Bell's palsy [id number]   Complication of anesthesia    Hard to wake up after a  colonoscopy before  around 2000   Diverticulitis    Dry eye    Family history of adverse reaction to anesthesia    mom hard to wake up and had lethargy after anesthesia   Glaucoma    Gout    Hepatitis 1986   secondary to varicella was in hospital for 2 days   Hypertension    Hypothyroidism    Malaria    as as child   Mumps    PONV (postoperative nausea and vomiting)    Pre-diabetes    Rosacea    Scoliosis    Sjogren's syndrome ([place])    Past Surgical History:  Procedure Laterality Date   [place] Bilateral    cataract   JOINT REPLACEMENT  2012   Left knee   JOINT REPLACEMENT     right knee 2017   REVERSE SHOULDER ARTHROPLASTY Left 10/23/2020   Procedure: REVERSE SHOULDER ARTHROPLASTY;  Surgeon: Netta Cedars, MD;  Location: [facility];  Service: Orthopedics;  Laterality: Left;  with ISB   steroid injections     L3,L4, L5    Medications: Prior to Admission medications   Medication Sig Start Date End Date Taking? Authorizing Provider  acetaminophen (TYLENOL) 650 MG CR tablet Take 1,300 mg by mouth in the morning, at noon, and at bedtime. Takes 2 tablets in the morning, 2 in the afternoon, and 2 at bedtime. 03/04/19   [provider]  amoxicillin-clavulanate (AUGMENTIN) 875-125 MG tablet Take 1 tablet by mouth every 12 (twelve) hours. 03/31/22   Audley Hose, MD  bismuth subsalicylate (PEPTO BISMOL) 262 MG chewable tablet Chew 524 mg by mouth as needed for indigestion or diarrhea or loose stools.    [provider]  brimonidine-timolol (COMBIGAN) 0.2-0.5 % ophthalmic solution Place 1 drop into both eyes every 12 (twelve) hours.    [provider]  Carboxymethylcellulose Sodium 1 % GEL Place 1  drop into both eyes at bedtime.    [provider]  Cholecalciferol (VITAMIN D) 50 MCG (2000 UT) tablet Take 2,000 Units by mouth daily.    [provider]  Cobalamin Combinations (B-12) 1000-400 MCG SUBL Place 1 tablet under the tongue daily.    [provider]  estradiol (ESTRACE) 1 MG tablet Take 1 mg by mouth daily. 09/27/20   [provider]  gabapentin (NEURONTIN) 100 MG capsule Take 1 tablet in the morning and bedtime along with '300mg'$  tablets. Patient taking differently: Take 100 mg by mouth at bedtime. 01/21/22   Patel, Arvin Collard K, DO  gabapentin (NEURONTIN) 300 MG capsule Take 1 capsule (300 mg total) by mouth 3 (three) times daily. 01/21/22   Narda Amber K, DO  glycerin adult 2 g suppository Place 1 suppository rectally as needed for constipation. 03/16/22   Berton Mount, NP  hydroxypropyl methylcellulose / hypromellose (ISOPTO TEARS / GONIOVISC) 2.5 % ophthalmic solution Place 1 drop into both eyes daily.    [provider]  levothyroxine (SYNTHROID) 112 MCG tablet Take 112 mcg by mouth daily. 07/20/20   [provider]  lipase/protease/amylase (CREON) 36000 UNITS CPEP capsule Take 2 capsules (72,000 Units total) by mouth 3 (three) times daily with meals. 04/08/22 05/08/22  Barb Merino, MD  magnesium oxide (MAG-OX) 400 MG tablet Take 1 tablet (400 mg total) by mouth daily. 04/09/22 05/09/22  Barb Merino, MD  Menthol, Topical Analgesic, (BIOFREEZE EX) Apply 1 drop topically daily as needed (back pain).    [provider]  Multiple Vitamins-Minerals (CENTRUM SILVER 50+WOMEN PO) Take 1 tablet by mouth daily.    [provider]  omeprazole (PRILOSEC) 20 MG capsule Take 20 mg by mouth daily. 07/20/20   [provider]  potassium chloride SA (KLOR-CON M) 20 MEQ tablet Take 1 tablet (20 mEq total) by mouth daily for 14 days. 04/09/22 04/23/22  Barb Merino, MD  Propylene Glycol (SYSTANE COMPLETE OP) Place 1 drop into  both eyes 2 (two) times daily as needed (dry eyes).    [provider]  psyllium (METAMUCIL) 58.6 % packet Take 1 packet by mouth daily as needed (constipation).    [provider]  rosuvastatin (CRESTOR) 10 MG tablet Take 10 mg by mouth at bedtime. 04/15/20   [provider]  simethicone (MYLICON) 109 MG chewable tablet Chew 125 mg by mouth every 6 (six) hours as needed for flatulence.    [provider]    Allergies:   Allergies  Allergen Reactions   Other Itching    Adhesive tape    Oyster Shell Hives   Shellfish Allergy Nausea And Vomiting    Oysters    Social History:  reports that she has never smoked. She has never used smokeless tobacco. She reports that she does not currently use alcohol. She reports that she does not use drugs.  Family History: Family History  Problem Relation Age of Onset   Colon cancer Mother    Lung cancer Mother  Hypertension Mother    Diabetes Father    Breast cancer Paternal Grandmother     Physical Exam: Vitals:   04/16/22 0115 04/16/22 0130 04/16/22 0145 04/16/22 0215  BP: 100/80 (!) 108/55 (!) 113/52   Pulse: 76 72 74 78  Resp: 18  18   Temp:      TempSrc:      SpO2: 99% 96% 96% 95%  Weight:      Height:        General:  Alert and oriented times three, well developed and nourished, no acute distress Eyes: PERRLA, pink conjunctiva, no scleral icterus ENT: Moist oral mucosa, neck supple, no thyromegaly Lungs: clear to ascultation, no wheeze, no crackles, no use of accessory muscles Cardiovascular: regular rate and rhythm, no regurgitation, no gallops, no murmurs. No carotid bruits, no JVD Abdomen: soft, positive BS, non-distended, no organomegaly, not an acute abdomen, bilateral lower quadrant tenderness to palpation GU: not examined Neuro: CN II - XII grossly intact, sensation intact Musculoskeletal: strength 5/5 all extremities, no clubbing, cyanosis or edema Skin: no rash, no subcutaneous  crepitation, no decubitus Psych: appropriate patient   Labs on Admission:  Recent Labs    04/15/22 1926  NA 137  K 3.7  CL 101  CO2 22  GLUCOSE 96  BUN 12  CREATININE 0.63  CALCIUM 9.2   Recent Labs    04/15/22 1926  AST 22  ALT 12  ALKPHOS 48  BILITOT 0.8  PROT 7.1  ALBUMIN 3.1*    Recent Labs    04/15/22 1926  WBC 8.9  NEUTROABS 6.9  HGB 13.4  HCT 41.7  MCV 98.8  PLT 170    Micro Results: Recent Results (from the past 240 hour(s))  Resp panel by RT-PCR (RSV, Flu A&B, Covid) Anterior Nasal Swab     Status: None   Collection Time: 04/16/22  1:15 AM   Specimen: Anterior Nasal Swab  Result Value Ref Range Status   SARS Coronavirus 2 by RT PCR NEGATIVE NEGATIVE Final    Comment: (NOTE) SARS-CoV-2 target nucleic acids are NOT DETECTED.  The SARS-CoV-2 RNA is generally detectable in upper respiratory specimens during the acute phase of infection. The lowest concentration of SARS-CoV-2 viral copies this assay can detect is 138 copies/mL. A negative result does not preclude SARS-Cov-2 infection and should not be used as the sole basis for treatment or other patient management decisions. A negative result may occur with  improper specimen collection/handling, submission of specimen other than nasopharyngeal swab, presence of viral mutation(s) within the areas targeted by this assay, and inadequate number of viral copies(<138 copies/mL). A negative result must be combined with clinical observations, patient history, and epidemiological information. The expected result is Negative.  Fact Sheet for Patients:  EntrepreneurPulse.com.au  Fact Sheet for Healthcare Providers:  IncredibleEmployment.be  This test is no t yet approved or cleared by the [place] FDA and  has been authorized for detection and/or diagnosis of SARS-CoV-2 by FDA under an Emergency Use Authorization (EUA). This EUA will remain  in effect (meaning  this test can be used) for the duration of the COVID-19 declaration under Section 564(b)(1) of the Act, 21 U.S.C.section 360bbb-3(b)(1), unless the authorization is terminated  or revoked sooner.       Influenza A by PCR NEGATIVE NEGATIVE Final   Influenza B by PCR NEGATIVE NEGATIVE Final    Comment: (NOTE) The Xpert Xpress SARS-CoV-2/FLU/RSV plus assay is intended as an aid in the diagnosis of influenza from Nasopharyngeal  swab specimens and should not be used as a sole basis for treatment. Nasal washings and aspirates are unacceptable for Xpert Xpress SARS-CoV-2/FLU/RSV testing.  Fact Sheet for Patients: EntrepreneurPulse.com.au  Fact Sheet for Healthcare Providers: IncredibleEmployment.be  This test is not yet approved or cleared by the [place] FDA and has been authorized for detection and/or diagnosis of SARS-CoV-2 by FDA under an Emergency Use Authorization (EUA). This EUA will remain in effect (meaning this test can be used) for the duration of the COVID-19 declaration under Section 564(b)(1) of the Act, 21 U.S.C. section 360bbb-3(b)(1), unless the authorization is terminated or revoked.     Resp Syncytial Virus by PCR NEGATIVE NEGATIVE Final    Comment: (NOTE) Fact Sheet for Patients: EntrepreneurPulse.com.au  Fact Sheet for Healthcare Providers: IncredibleEmployment.be  This test is not yet approved or cleared by the [place] FDA and has been authorized for detection and/or diagnosis of SARS-CoV-2 by FDA under an Emergency Use Authorization (EUA). This EUA will remain in effect (meaning this test can be used) for the duration of the COVID-19 declaration under Section 564(b)(1) of the Act, 21 U.S.C. section 360bbb-3(b)(1), unless the authorization is terminated or revoked.  Performed at [facility], [place] [address] [place], [place] [address]       Radiological Exams on Admission: CT ABDOMEN PELVIS W CONTRAST  Result Date: 04/16/2022 CLINICAL DATA:  Left lower quadrant abdominal pain. Fever and urinary incontinence. EXAM: CT ABDOMEN AND PELVIS WITH CONTRAST TECHNIQUE: Multidetector CT imaging of the abdomen and pelvis was performed using the standard protocol following bolus administration of intravenous contrast. RADIATION DOSE REDUCTION: This exam was performed according to the departmental dose-optimization program which includes automated exposure control, adjustment of the mA and/or kV according to patient size and/or use of iterative reconstruction technique. CONTRAST:  178m OMNIPAQUE IOHEXOL 300 MG/ML  SOLN COMPARISON:  CT 04/04/2022 FINDINGS: Lower chest: Bibasilar atelectasis/scarring. Hepatobiliary: Cholecystectomy. No solid hepatic lesion. No biliary dilation. Pancreas: No acute abnormality. Spleen: No acute abnormality. Adrenals/Urinary Tract: No urinary calculi or hydronephrosis. Unremarkable bladder. Normal adrenal glands. Stomach/Bowel: Stomach is nondistended. Normal caliber large and small bowel. Marked wall thickening and mucosal hyperenhancement of the sigmoid colon and rectum. Mild adjacent pericolonic fat stranding. There are a few sigmoid diverticula. Findings are similar to 04/04/2022. Large colonic stool load in the right and transverse colon. The appendix is not visualized. Vascular/Lymphatic: Aortic atherosclerosis. No enlarged abdominal or pelvic lymph nodes. Reproductive: Hysterectomy. Other: Small amount of free fluid in the pelvis. No free intraperitoneal air. Musculoskeletal: Thoracolumbar spondylosis. No acute osseous abnormality. IMPRESSION: Similar proctitis and sigmoid diverticulitis compared with 04/04/2022. No evidence of perforation or abscess. Consider follow-up colonoscopy following resolution of patient's acute symptoms to exclude underlying mass. Large colonic stool load in the right and transverse colon.  Correlate for constipation. Aortic Atherosclerosis (ICD10-I70.0). Electronically Signed   By: TPlacido SouM.D.   On: 04/16/2022 02:21    Assessment/Plan Present on Admission:  Acute diverticulitis -Admit to MedSurg -N.p.o., IV fluid hydration -Zosyn pharmacy to dose -GI consult -Creon resumed  Constipation -Patient has chronic constipation.  Likely having breakthrough stool -Senna twice daily and MiraLAX ordered -Defer to GI for additional input as patient with inflammation from her diverticulitis. -TSH ordered  Gout -Prednisone ordered   Hypothyroidism -Stable, Synthroid resumed, TSH ordered  Dyslipidemia -Simethicone resumed  Delecia Vastine 04/16/2022, 3:52 AM

## 2022-04-17 DIAGNOSIS — A0472 Enterocolitis due to Clostridium difficile, not specified as recurrent: Secondary | ICD-10-CM | POA: Diagnosis not present

## 2022-04-17 DIAGNOSIS — K5792 Diverticulitis of intestine, part unspecified, without perforation or abscess without bleeding: Secondary | ICD-10-CM | POA: Diagnosis not present

## 2022-04-17 LAB — GASTROINTESTINAL PANEL BY PCR, STOOL (REPLACES STOOL CULTURE)

## 2022-04-17 NOTE — Progress Notes (Signed)
PROGRESS NOTE    Tracy Hunt  OHY:[id number] DOB: 10-28-1945 DOA: 04/15/2022 PCP: Prince Solian, MD  Outpatient Specialists:     Brief Narrative:  Patient is a 77 year old female with history of hypertension, diverticulitis, gout, glaucoma, chronic constipation, hypothyroidism, Sjogren's disease and peripheral neuropathy.  Patient was recently admitted to the hospital for diverticulitis and discharged on 04/08/2022, to complete oral antibiotics (Augmentin) at home.  Patient is readmitted with symptoms suggestive of diverticulitis/colitis (abdominal pain with diarrhea).  Patient was initially on IV Zosyn.  However, stool analysis came back positive for C. difficile.  ID team has been consulted.  IV Zosyn has been discontinued.  Patient be continued on oral vancomycin.  04/17/2022: Patient seen.  Patient is a bit better today.  New symptoms reported.   Assessment & Plan:   Principal Problem:   Acute diverticulitis Active Problems:   Hypothyroidism   HTN (hypertension)   Likely C. difficile colitis: Initially presumed to have diverticulitis. -Stool C. difficile came back positive. -IV Zosyn has been discontinued. -Oral vancomycin has been started. -Input from GI and infectious disease team is highly appreciated.      Gout: -Patient was initially on prednisone. -Prednisone has been discontinued due to recent C. difficile colitis.   -Stable gout symptoms.    Hypothyroidism -Stable, Synthroid resumed, TSH ordered   Dyslipidemia -Continue Crestor.     DVT prophylaxis: Subcu Lovenox 40 Mg once daily. Code Status: Full code Family Communication:  Disposition Plan: Likely discharge back home.   Consultants:  Gastroenterology. Infectious disease.  Procedures:  None.  Antimicrobials:  IV Zosyn has been discontinued. Oral vancomycin.   Subjective: Abdominal pain and diarrhea.  Objective: Vitals:   04/17/22 0411 04/17/22 0902 04/17/22 1026 04/17/22 1146   BP: 134/71 (!) 120/59 (!) 113/59 101/69  Pulse: 89 77 81 76  Resp: '16 16 16 16  '$ Temp: 99.8 F (37.7 C) 99.3 F (37.4 C) (!) 100.5 F (38.1 C) 98.8 F (37.1 C)  TempSrc: Oral Oral Oral Oral  SpO2: 96% 99% 97% 95%  Weight:      Height:        Intake/Output Summary (Last 24 hours) at 04/17/2022 1612 Last data filed at 04/17/2022 1400 Gross per 24 hour  Intake 1045.93 ml  Output 500 ml  Net 545.93 ml   Filed Weights   04/16/22 0011  Weight: 70 kg    Examination:  General exam: Appears calm and comfortable  Respiratory system: Clear to auscultation.  Cardiovascular system: S1 & S2 heard Gastrointestinal system: Abdomen is soft.  Tenderness is vague.  . Central nervous system: Alert and oriented. No focal neurological deficits. Extremities: No leg edema.  Data Reviewed: I have personally reviewed following labs and imaging studies  CBC: Recent Labs  Lab 04/15/22 1926 04/16/22 0433  WBC 8.9 6.4  NEUTROABS 6.9  --   HGB 13.4 12.8  HCT 41.7 39.9  MCV 98.8 100.3*  PLT 170 PLATELET CLUMPS NOTED ON SMEAR, UNABLE TO ESTIMATE   Basic Metabolic Panel: Recent Labs  Lab 04/15/22 1926 04/16/22 0433  NA 137 135  K 3.7 3.9  CL 101 103  CO2 22 24  GLUCOSE 96 87  BUN 12 12  CREATININE 0.63 0.63  CALCIUM 9.2 8.7*   GFR: Estimated Creatinine Clearance: 56.1 mL/min (by C-G formula based on SCr of 0.63 mg/dL). Liver Function Tests: Recent Labs  Lab 04/15/22 1926  AST 22  ALT 12  ALKPHOS 48  BILITOT 0.8  PROT 7.1  ALBUMIN  3.1*   No results for input(s): "LIPASE", "AMYLASE" in the last 168 hours. No results for input(s): "AMMONIA" in the last 168 hours. Coagulation Profile: Recent Labs  Lab 04/15/22 1926  INR 1.0   Cardiac Enzymes: No results for input(s): "CKTOTAL", "CKMB", "CKMBINDEX", "TROPONINI" in the last 168 hours. BNP (last 3 results) No results for input(s): "PROBNP" in the last 8760 hours. HbA1C: No results for input(s): "HGBA1C" in the last 72  hours. CBG: No results for input(s): "GLUCAP" in the last 168 hours. Lipid Profile: No results for input(s): "CHOL", "HDL", "LDLCALC", "TRIG", "CHOLHDL", "LDLDIRECT" in the last 72 hours. Thyroid Function Tests: Recent Labs    04/16/22 0530  TSH 0.107*   Anemia Panel: No results for input(s): "VITAMINB12", "FOLATE", "FERRITIN", "TIBC", "IRON", "RETICCTPCT" in the last 72 hours. Urine analysis:    Component Value Date/Time   COLORURINE YELLOW 04/15/2022 1955   APPEARANCEUR CLEAR 04/15/2022 1955   LABSPEC 1.020 04/15/2022 1955   PHURINE 6.0 04/15/2022 1955   GLUCOSEU NEGATIVE 04/15/2022 1955   HGBUR NEGATIVE 04/15/2022 1955   BILIRUBINUR NEGATIVE 04/15/2022 1955   BILIRUBINUR negative 03/16/2022 0933   KETONESUR 20 (A) 04/15/2022 1955   PROTEINUR NEGATIVE 04/15/2022 1955   UROBILINOGEN 1.0 03/16/2022 0933   NITRITE NEGATIVE 04/15/2022 1955   LEUKOCYTESUR NEGATIVE 04/15/2022 1955   Sepsis Labs: '@LABRCNTIP'$ (procalcitonin:4,lacticidven:4)  ) Recent Results (from the past 240 hour(s))  Blood Culture (routine x 2)     Status: None (Preliminary result)   Collection Time: 04/15/22  7:26 PM   Specimen: BLOOD  Result Value Ref Range Status   Specimen Description   Final    BLOOD BLOOD RIGHT FOREARM Performed at [facility], [place] [address] [place], [place] [address]    Special Requests   Final    BOTTLES DRAWN AEROBIC AND ANAEROBIC Blood Culture adequate volume Performed at [place] [address], [place], [place] [address]    Culture   Final    NO GROWTH 1 DAY Performed at [facility], [place] [address] [place], [place] [address]    Report Status PENDING  Incomplete  Blood Culture (routine x 2)     Status: None (Preliminary result)   Collection Time: 04/15/22 10:52 PM   Specimen: BLOOD  Result Value Ref Range Status   Specimen Description   Final    BLOOD BLOOD RIGHT ARM Performed at [facility] [address], [place], [place] [address]    Special Requests   Final    BOTTLES DRAWN AEROBIC AND ANAEROBIC Blood Culture adequate volume Performed at [place] [address] [place], [place] [address]    Culture   Final    NO GROWTH 1 DAY Performed at [facility], [place] [address], [place], [place] [address]    Report Status PENDING  Incomplete  Resp panel by RT-PCR (RSV, Flu A&B, Covid) Anterior Nasal Swab     Status: None   Collection Time: 04/16/22  1:15 AM   Specimen: Anterior Nasal Swab  Result Value Ref Range Status   SARS Coronavirus 2 by RT PCR NEGATIVE NEGATIVE Final    Comment: (NOTE) SARS-CoV-2 target nucleic acids are NOT DETECTED.  The SARS-CoV-2 RNA is generally detectable in upper respiratory specimens during the acute phase of infection. The lowest concentration of SARS-CoV-2 viral copies this assay can detect is 138 copies/mL. A negative result does not preclude SARS-Cov-2 infection and should not be used as the  sole basis for treatment or other patient management decisions. A negative result may occur with  improper specimen collection/handling, submission of specimen other than nasopharyngeal swab, presence of viral mutation(s) within the areas targeted by this assay, and inadequate number of viral copies(<138 copies/mL). A negative result must be combined with clinical observations, patient history, and epidemiological information. The expected result is Negative.  Fact Sheet for Patients:  EntrepreneurPulse.com.au  Fact Sheet for Healthcare Providers:  IncredibleEmployment.be  This test is no t yet approved or cleared by the [place] FDA and  has been authorized for detection and/or diagnosis of SARS-CoV-2 by FDA under an Emergency Use Authorization (EUA). This EUA will remain  in effect (meaning this test can be used) for the duration of the COVID-19 declaration under Section 564(b)(1)  of the Act, 21 U.S.C.section 360bbb-3(b)(1), unless the authorization is terminated  or revoked sooner.       Influenza A by PCR NEGATIVE NEGATIVE Final   Influenza B by PCR NEGATIVE NEGATIVE Final    Comment: (NOTE) The Xpert Xpress SARS-CoV-2/FLU/RSV plus assay is intended as an aid in the diagnosis of influenza from Nasopharyngeal swab specimens and should not be used as a sole basis for treatment. Nasal washings and aspirates are unacceptable for Xpert Xpress SARS-CoV-2/FLU/RSV testing.  Fact Sheet for Patients: EntrepreneurPulse.com.au  Fact Sheet for Healthcare Providers: IncredibleEmployment.be  This test is not yet approved or cleared by the [place] FDA and has been authorized for detection and/or diagnosis of SARS-CoV-2 by FDA under an Emergency Use Authorization (EUA). This EUA will remain in effect (meaning this test can be used) for the duration of the COVID-19 declaration under Section 564(b)(1) of the Act, 21 U.S.C. section 360bbb-3(b)(1), unless the authorization is terminated or revoked.     Resp Syncytial Virus by PCR NEGATIVE NEGATIVE Final    Comment: (NOTE) Fact Sheet for Patients: EntrepreneurPulse.com.au  Fact Sheet for Healthcare Providers: IncredibleEmployment.be  This test is not yet approved or cleared by the [place] FDA and has been authorized for detection and/or diagnosis of SARS-CoV-2 by FDA under an Emergency Use Authorization (EUA). This EUA will remain in effect (meaning this test can be used) for the duration of the COVID-19 declaration under Section 564(b)(1) of the Act, 21 U.S.C. section 360bbb-3(b)(1), unless the authorization is terminated or revoked.  Performed at [facility], [place] [address], [place], [place] [address]   Gastrointestinal Panel by PCR , Stool     Status: None   Collection Time: 04/16/22  2:43 PM   Specimen:  Stool  Result Value Ref Range Status   Campylobacter species NOT DETECTED NOT DETECTED Final   Plesimonas shigelloides NOT DETECTED NOT DETECTED Final   Salmonella species NOT DETECTED NOT DETECTED Final   Yersinia enterocolitica NOT DETECTED NOT DETECTED Final   Vibrio species NOT DETECTED NOT DETECTED Final   Vibrio cholerae NOT DETECTED NOT DETECTED Final   Enteroaggregative E coli (EAEC) NOT DETECTED NOT DETECTED Final   Enteropathogenic E coli (EPEC) NOT DETECTED NOT DETECTED Final   Enterotoxigenic E coli (ETEC) NOT DETECTED NOT DETECTED Final   Shiga like toxin producing E coli (STEC) NOT DETECTED NOT DETECTED Final   Shigella/Enteroinvasive E coli (EIEC) NOT DETECTED NOT DETECTED Final   Cryptosporidium NOT DETECTED NOT DETECTED Final   Cyclospora cayetanensis NOT DETECTED NOT DETECTED Final   Entamoeba histolytica NOT DETECTED NOT DETECTED Final   Giardia lamblia NOT DETECTED NOT DETECTED Final   Adenovirus F40/41 NOT DETECTED NOT DETECTED  Final   Astrovirus NOT DETECTED NOT DETECTED Final   Norovirus GI/GII NOT DETECTED NOT DETECTED Final   Rotavirus A NOT DETECTED NOT DETECTED Final   Sapovirus (I, II, IV, and V) NOT DETECTED NOT DETECTED Final    Comment: Performed at [facility], [address] [place], [place] [address]  C Difficile Quick Screen w PCR reflex     Status: Abnormal   Collection Time: 04/16/22  2:43 PM   Specimen: Stool  Result Value Ref Range Status   C Diff antigen POSITIVE (A) NEGATIVE Final   C Diff toxin POSITIVE (A) NEGATIVE Final   C Diff interpretation Toxin producing C. difficile detected.  Final    Comment: CRITICAL RESULT CALLED TO, READ BACK BY AND VERIFIED WITH: PATTY RN ON 04/16/22 AT 1715 BY GOLSONM Performed at [facility] [address] [place],  [address]          Radiology Studies: CT ABDOMEN PELVIS W CONTRAST  Result Date: 04/16/2022 CLINICAL DATA:  Left lower quadrant abdominal pain.  Fever and urinary incontinence. EXAM: CT ABDOMEN AND PELVIS WITH CONTRAST TECHNIQUE: Multidetector CT imaging of the abdomen and pelvis was performed using the standard protocol following bolus administration of intravenous contrast. RADIATION DOSE REDUCTION: This exam was performed according to the departmental dose-optimization program which includes automated exposure control, adjustment of the mA and/or kV according to patient size and/or use of iterative reconstruction technique. CONTRAST:  11m OMNIPAQUE IOHEXOL 300 MG/ML  SOLN COMPARISON:  CT 04/04/2022 FINDINGS: Lower chest: Bibasilar atelectasis/scarring. Hepatobiliary: Cholecystectomy. No solid hepatic lesion. No biliary dilation. Pancreas: No acute abnormality. Spleen: No acute abnormality. Adrenals/Urinary Tract: No urinary calculi or hydronephrosis. Unremarkable bladder. Normal adrenal glands. Stomach/Bowel: Stomach is nondistended. Normal caliber large and small bowel. Marked wall thickening and mucosal hyperenhancement of the sigmoid colon and rectum. Mild adjacent pericolonic fat stranding. There are a few sigmoid diverticula. Findings are similar to 04/04/2022. Large colonic stool load in the right and transverse colon. The appendix is not visualized. Vascular/Lymphatic: Aortic atherosclerosis. No enlarged abdominal or pelvic lymph nodes. Reproductive: Hysterectomy. Other: Small amount of free fluid in the pelvis. No free intraperitoneal air. Musculoskeletal: Thoracolumbar spondylosis. No acute osseous abnormality. IMPRESSION: Similar proctitis and sigmoid diverticulitis compared with 04/04/2022. No evidence of perforation or abscess. Consider follow-up colonoscopy following resolution of patient's acute symptoms to exclude underlying mass. Large colonic stool load in the right and transverse colon. Correlate for constipation. Aortic Atherosclerosis (ICD10-I70.0). Electronically Signed   By: TPlacido SouM.D.   On: 04/16/2022 02:21         Scheduled Meds:  brimonidine  1 drop Both Eyes BID   And   timolol  1 drop Both Eyes BID   enoxaparin (LOVENOX) injection  40 mg Subcutaneous Q24H   estradiol  1 mg Oral Daily   gabapentin  300 mg Oral Q24H   gabapentin  400 mg Oral BID   levothyroxine  112 mcg Oral Q0600   lipase/protease/amylase  72,000 Units Oral TID WC   rosuvastatin  10 mg Oral QHS   vancomycin  125 mg Oral QID   Continuous Infusions:   LOS: 1 day    Time spent: 55 minutes.    SDana Allan MD  [facility] Pager #: [phone]-7AM contact night coverage as above

## 2022-04-17 NOTE — Progress Notes (Addendum)
GI Progress Note Covering for Drs. Mann & Hung   Assessment    C diff colitis. R/O persistent vs resolved diverticulitis. R/O IBD.    Recommendations   Continue vancomycin Can prednisone be reduced, discontinued?  Should we discontinue Zosyn for diverticulitis and treat only C diff?  Infectious disease consult Flex sigmoidoscopy or colonoscopy at some point - per Dr. Benson Norway Dr. Benson Norway to resume GI care Monday   Chief Complaint   Persistent diarrhea and fevers. C diff toxin positive, vanco was started. I spoke with her son, Quillian Quince, an Emergency Medicine physician in [place] and reviewed her active GI problems and addressed his questions.  Vital signs in last 24 hours: Temp:  [98.2 F (36.8 C)-103.1 F (39.5 C)] 99.8 F (37.7 C) (01/28 0411) Pulse Rate:  [66-105] 89 (01/28 0411) Resp:  [16-17] 16 (01/28 0411) BP: (111-147)/(50-83) 134/71 (01/28 0411) SpO2:  [93 %-100 %] 96 % (01/28 0411) Last BM Date : 04/16/22  General: Alert, well-developed,  in NAD Heart:  Regular rate and rhythm; no murmurs Chest: Clear to ascultation bilaterally Abdomen:  Soft, mild LLQ tenderness and nondistended. Normal bowel sounds, without guarding, and without rebound.   Extremities:  Without edema. Neurologic:  Alert and  oriented x4; grossly normal neurologically. Psych:  Alert and cooperative. Normal mood and affect.  Intake/Output from previous day: 01/27 0701 - 01/28 0700 In: 445.9 [P.O.:270; IV Piggyback:175.9] Out: 100 [Urine:100] Intake/Output this shift: No intake/output data recorded.  Lab Results: Recent Labs    04/15/22 1926 04/16/22 0433  WBC 8.9 6.4  HGB 13.4 12.8  HCT 41.7 39.9  PLT 170 PLATELET CLUMPS NOTED ON SMEAR, UNABLE TO ESTIMATE   BMET Recent Labs    04/15/22 1926 04/16/22 0433  NA 137 135  K 3.7 3.9  CL 101 103  CO2 22 24  GLUCOSE 96 87  BUN 12 12  CREATININE 0.63 0.63  CALCIUM 9.2 8.7*   LFT Recent Labs    04/15/22 1926  PROT 7.1   ALBUMIN 3.1*  AST 22  ALT 12  ALKPHOS 48  BILITOT 0.8   PT/INR Recent Labs    04/15/22 1926  LABPROT 13.4  INR 1.0    Studies/Results: CT ABDOMEN PELVIS W CONTRAST  Result Date: 04/16/2022 CLINICAL DATA:  Left lower quadrant abdominal pain. Fever and urinary incontinence. EXAM: CT ABDOMEN AND PELVIS WITH CONTRAST TECHNIQUE: Multidetector CT imaging of the abdomen and pelvis was performed using the standard protocol following bolus administration of intravenous contrast. RADIATION DOSE REDUCTION: This exam was performed according to the departmental dose-optimization program which includes automated exposure control, adjustment of the mA and/or kV according to patient size and/or use of iterative reconstruction technique. CONTRAST:  183m OMNIPAQUE IOHEXOL 300 MG/ML  SOLN COMPARISON:  CT 04/04/2022 FINDINGS: Lower chest: Bibasilar atelectasis/scarring. Hepatobiliary: Cholecystectomy. No solid hepatic lesion. No biliary dilation. Pancreas: No acute abnormality. Spleen: No acute abnormality. Adrenals/Urinary Tract: No urinary calculi or hydronephrosis. Unremarkable bladder. Normal adrenal glands. Stomach/Bowel: Stomach is nondistended. Normal caliber large and small bowel. Marked wall thickening and mucosal hyperenhancement of the sigmoid colon and rectum. Mild adjacent pericolonic fat stranding. There are a few sigmoid diverticula. Findings are similar to 04/04/2022. Large colonic stool load in the right and transverse colon. The appendix is not visualized. Vascular/Lymphatic: Aortic atherosclerosis. No enlarged abdominal or pelvic lymph nodes. Reproductive: Hysterectomy. Other: Small amount of free fluid in the pelvis. No free intraperitoneal air. Musculoskeletal: Thoracolumbar spondylosis. No acute osseous abnormality. IMPRESSION: Similar proctitis and sigmoid diverticulitis  compared with 04/04/2022. No evidence of perforation or abscess. Consider follow-up colonoscopy following resolution of  patient's acute symptoms to exclude underlying mass. Large colonic stool load in the right and transverse colon. Correlate for constipation. Aortic Atherosclerosis (ICD10-I70.0). Electronically Signed   By: Placido Sou M.D.   On: 04/16/2022 02:21      LOS: 1 day   Attila Mccarthy T. Fuller Plan, MD 04/17/2022, 9:00 AM See [facility], to contact our on call provider

## 2022-04-17 NOTE — Consult Note (Signed)
[facility]       Reason for Consult: C diff infection    Referring Physician: Dr. Marthenia Rolling  Principal Problem:   Acute diverticulitis Active Problems:   Hypothyroidism   HTN (hypertension)    brimonidine  1 drop Both Eyes BID   And   timolol  1 drop Both Eyes BID   enoxaparin (LOVENOX) injection  40 mg Subcutaneous Q24H   estradiol  1 mg Oral Daily   gabapentin  300 mg Oral Q24H   gabapentin  400 mg Oral BID   levothyroxine  112 mcg Oral Q0600   lipase/protease/amylase  72,000 Units Oral TID WC   predniSONE  20 mg Oral Q breakfast   rosuvastatin  10 mg Oral QHS   vancomycin  125 mg Oral QID    Recommendations: Continue oral vancomycin Stop piperacillin/tazobactam Stop prednisone  Assessment: She has had several months of ongoing symptoms including early satiety, poor digestion and years of n/diarrhea and constipation.  Recently admitted with worsening diarrhea and abdominal pain and CT c/w diverticulitis and treated with antibiotics including ceftriaxone and metronidazole followed by Augmentin.  Now with C diff Ag and toxin positive.   I discussed the options with the patient.  New C diff infection certainly possible with recent ceftriaxone use, now positive toxin and clinical syndrome and agree with treatment for this.  Some abdominal pain but is typical for her and no clear benefit with using antibiotics for diverticulitis so I favor stopping this as clinically she is not having significant pain.  She also is having little discomfort related to gout so will stop prednisone.    Antibiotics: Pip/tazo  HPI: [name] Portal is a 77 y.o. female with a long history of intermittent constipation/diarrhea and recently diagnosed with IBS-C and now on pancreatic enzyme replacement here withdiarrhea.  This has worsened compared to her baseline with recent antibiotic use.  No fever.  She has noted significant weight loss since October.  No evidence  of abscess on CT.     Review of Systems:  Constitutional: negative for fevers and chills All other systems reviewed and are negative    Past Medical History:  Diagnosis Date   Anemia    Arthritis    Bell's palsy 6433   Complication of anesthesia    Hard to wake up after a  colonoscopy before  around 2000   Diverticulitis    Dry eye    Family history of adverse reaction to anesthesia    mom hard to wake up and had lethargy after anesthesia   Glaucoma    Gout    Hepatitis 1986   secondary to varicella was in hospital for 2 days   Hypertension    Hypothyroidism    Malaria    as as child   Mumps    PONV (postoperative nausea and vomiting)    Pre-diabetes    Rosacea    Scoliosis    Sjogren's syndrome (HCC)     Social History   Tobacco Use   Smoking status: Never   Smokeless tobacco: Never  Vaping Use   Vaping Use: Never used  Substance Use Topics   Alcohol use: Not Currently   Drug use: Never    Family History  Problem Relation Age of Onset   Colon cancer Mother    Lung cancer Mother    Hypertension Mother    Diabetes Father    Breast cancer Paternal Grandmother  Allergies  Allergen Reactions   Cosopt [Dorzolamide Hcl-Timolol Mal] Anaphylaxis    "Very irritated"   Other Itching    Adhesive tape    Oyster Shell Hives    Physical Exam: Constitutional: in no apparent distress  Vitals:   04/17/22 1026 04/17/22 1146  BP: (!) 113/59 101/69  Pulse: 81 76  Resp: 16 16  Temp: (!) 100.5 F (38.1 C) 98.8 F (37.1 C)  SpO2: 97% 95%   EYES: anicteric Respiratory: normal respiratory effort GI: soft Musculoskeletal: no edema  Lab Results  Component Value Date   WBC 6.4 04/16/2022   HGB 12.8 04/16/2022   HCT 39.9 04/16/2022   MCV 100.3 (H) 04/16/2022   PLT PLATELET CLUMPS NOTED ON SMEAR, UNABLE TO ESTIMATE 04/16/2022    Lab Results  Component Value Date   CREATININE 0.63 04/16/2022   BUN 12 04/16/2022   NA 135 04/16/2022   K 3.9 04/16/2022    CL 103 04/16/2022   CO2 24 04/16/2022    Lab Results  Component Value Date   ALT 12 04/15/2022   AST 22 04/15/2022   ALKPHOS 48 04/15/2022     Microbiology: Recent Results (from the past 240 hour(s))  Blood Culture (routine x 2)     Status: None (Preliminary result)   Collection Time: 04/15/22  7:26 PM   Specimen: BLOOD  Result Value Ref Range Status   Specimen Description   Final    BLOOD BLOOD RIGHT FOREARM Performed at [facility], [place] [address], [place], [place] [address]    Special Requests   Final    BOTTLES DRAWN AEROBIC AND ANAEROBIC Blood Culture adequate volume Performed at [facility] [address] [place], [place] [address]    Culture   Final    NO GROWTH 1 DAY Performed at [facility], [place] [address] [place], [place] [address]    Report Status PENDING  Incomplete  Blood Culture (routine x 2)     Status: None (Preliminary result)   Collection Time: 04/15/22 10:52 PM   Specimen: BLOOD  Result Value Ref Range Status   Specimen Description   Final    BLOOD BLOOD RIGHT ARM Performed at [facility] [address], [place], [place] [address]    Special Requests   Final    BOTTLES DRAWN AEROBIC AND ANAEROBIC Blood Culture adequate volume Performed at [facility] [address], [place], [place] [address]    Culture   Final    NO GROWTH 1 DAY Performed at [facility], [place] [address] [place], [place] [address]    Report Status PENDING  Incomplete  Resp panel by RT-PCR (RSV, Flu A&B, Covid) Anterior Nasal Swab     Status: None   Collection Time: 04/16/22  1:15 AM   Specimen: Anterior Nasal Swab  Result Value Ref Range Status   SARS Coronavirus 2 by RT PCR NEGATIVE NEGATIVE Final    Comment: (NOTE) SARS-CoV-2 target nucleic acids are NOT DETECTED.  The SARS-CoV-2 RNA is generally detectable in upper respiratory specimens during the acute phase of infection.  The lowest concentration of SARS-CoV-2 viral copies this assay can detect is 138 copies/mL. A negative result does not preclude SARS-Cov-2 infection and should not be used as the sole basis for treatment or other patient management decisions. A negative result may occur with  improper specimen collection/handling, submission of specimen other than nasopharyngeal swab, presence of viral mutation(s) within the areas  targeted by this assay, and inadequate number of viral copies(<138 copies/mL). A negative result must be combined with clinical observations, patient history, and epidemiological information. The expected result is Negative.  Fact Sheet for Patients:  EntrepreneurPulse.com.au  Fact Sheet for Healthcare Providers:  IncredibleEmployment.be  This test is no t yet approved or cleared by the [place] FDA and  has been authorized for detection and/or diagnosis of SARS-CoV-2 by FDA under an Emergency Use Authorization (EUA). This EUA will remain  in effect (meaning this test can be used) for the duration of the COVID-19 declaration under Section 564(b)(1) of the Act, 21 U.S.C.section 360bbb-3(b)(1), unless the authorization is terminated  or revoked sooner.       Influenza A by PCR NEGATIVE NEGATIVE Final   Influenza B by PCR NEGATIVE NEGATIVE Final    Comment: (NOTE) The Xpert Xpress SARS-CoV-2/FLU/RSV plus assay is intended as an aid in the diagnosis of influenza from Nasopharyngeal swab specimens and should not be used as a sole basis for treatment. Nasal washings and aspirates are unacceptable for Xpert Xpress SARS-CoV-2/FLU/RSV testing.  Fact Sheet for Patients: EntrepreneurPulse.com.au  Fact Sheet for Healthcare Providers: IncredibleEmployment.be  This test is not yet approved or cleared by the [place] FDA and has been authorized for detection and/or diagnosis of SARS-CoV-2 by FDA  under an Emergency Use Authorization (EUA). This EUA will remain in effect (meaning this test can be used) for the duration of the COVID-19 declaration under Section 564(b)(1) of the Act, 21 U.S.C. section 360bbb-3(b)(1), unless the authorization is terminated or revoked.     Resp Syncytial Virus by PCR NEGATIVE NEGATIVE Final    Comment: (NOTE) Fact Sheet for Patients: EntrepreneurPulse.com.au  Fact Sheet for Healthcare Providers: IncredibleEmployment.be  This test is not yet approved or cleared by the [place] FDA and has been authorized for detection and/or diagnosis of SARS-CoV-2 by FDA under an Emergency Use Authorization (EUA). This EUA will remain in effect (meaning this test can be used) for the duration of the COVID-19 declaration under Section 564(b)(1) of the Act, 21 U.S.C. section 360bbb-3(b)(1), unless the authorization is terminated or revoked.  Performed at [facility], [place] [address], [place], [place] [address]   Gastrointestinal Panel by PCR , Stool     Status: None   Collection Time: 04/16/22  2:43 PM   Specimen: Stool  Result Value Ref Range Status   Campylobacter species NOT DETECTED NOT DETECTED Final   Plesimonas shigelloides NOT DETECTED NOT DETECTED Final   Salmonella species NOT DETECTED NOT DETECTED Final   Yersinia enterocolitica NOT DETECTED NOT DETECTED Final   Vibrio species NOT DETECTED NOT DETECTED Final   Vibrio cholerae NOT DETECTED NOT DETECTED Final   Enteroaggregative E coli (EAEC) NOT DETECTED NOT DETECTED Final   Enteropathogenic E coli (EPEC) NOT DETECTED NOT DETECTED Final   Enterotoxigenic E coli (ETEC) NOT DETECTED NOT DETECTED Final   Shiga like toxin producing E coli (STEC) NOT DETECTED NOT DETECTED Final   Shigella/Enteroinvasive E coli (EIEC) NOT DETECTED NOT DETECTED Final   Cryptosporidium NOT DETECTED NOT DETECTED Final   Cyclospora cayetanensis NOT DETECTED NOT  DETECTED Final   Entamoeba histolytica NOT DETECTED NOT DETECTED Final   Giardia lamblia NOT DETECTED NOT DETECTED Final   Adenovirus F40/41 NOT DETECTED NOT DETECTED Final   Astrovirus NOT DETECTED NOT DETECTED Final   Norovirus GI/GII NOT DETECTED NOT DETECTED Final   Rotavirus A NOT DETECTED NOT DETECTED Final   Sapovirus (I, II, IV,  and V) NOT DETECTED NOT DETECTED Final    Comment: Performed at [facility], [place], [place] [address]  C Difficile Quick Screen w PCR reflex     Status: Abnormal   Collection Time: 04/16/22  2:43 PM   Specimen: Stool  Result Value Ref Range Status   C Diff antigen POSITIVE (A) NEGATIVE Final   C Diff toxin POSITIVE (A) NEGATIVE Final   C Diff interpretation Toxin producing C. difficile detected.  Final    Comment: CRITICAL RESULT CALLED TO, READ BACK BY AND VERIFIED WITH: PATTY RN ON 04/16/22 AT 1715 BY GOLSONM Performed at [facility] [address] [place], [place] [address]     Shakyla Nolley W Nealy Karapetian, MD [facility] for Infectious Disease [facility] [email or web address] 04/17/2022, 12:38 PM

## 2022-04-17 NOTE — Progress Notes (Signed)
   04/16/22 2357  Assess: MEWS Score  Temp (!) 103.1 F (39.5 C)  BP (!) 147/61  MAP (mmHg) 86  Pulse Rate 90  Resp 16  SpO2 94 %  O2 Device Room Air  Assess: MEWS Score  MEWS Temp 2  MEWS Systolic 0  MEWS Pulse 0  MEWS RR 0  MEWS LOC 0  MEWS Score 2  MEWS Score Color Yellow  Assess: if the MEWS score is Yellow or Red  Were vital signs taken at a resting state? Yes  Focused Assessment Change from prior assessment (see assessment flowsheet)  Does the patient meet 2 or more of the SIRS criteria? No  MEWS guidelines implemented *See Row Information* Yes  Treat  MEWS Interventions Administered prn meds/treatments  Pain Scale 0-10  Pain Score 0  Take Vital Signs  Increase Vital Sign Frequency  Yellow: Q 2hr X 2 then Q 4hr X 2, if remains yellow, continue Q 4hrs  Escalate  MEWS: Escalate Yellow: discuss with charge nurse/RN and consider discussing with provider and RRT  Notify: Charge Nurse/RN  Name of Charge Nurse/RN Notified Mahsa Hanser RN  Date Charge Nurse/RN Notified 04/17/22  Time Charge Nurse/RN Notified 0009  Provider Notification  Provider Name/Title Abigail NP  Date Provider Notified 04/17/22  Time Provider Notified 0007  Method of Notification Page  Notification Reason Change in status  Provider response Other (Comment) (NP suggested for make room cool, remove extra blanket, apply ice-pack)  Date of Provider Response 04/17/22  Time of Provider Response 0009  Document  Patient Outcome Other (Comment) (will continue to monitor)  Progress note created (see row info) Yes  Assess: SIRS CRITERIA  SIRS Temperature  1  SIRS Pulse 0  SIRS Respirations  0  SIRS WBC 0  SIRS Score Sum  1

## 2022-04-18 ENCOUNTER — Other Ambulatory Visit (HOSPITAL_COMMUNITY): Payer: Self-pay

## 2022-04-18 ENCOUNTER — Telehealth: Payer: Self-pay | Admitting: Gastroenterology

## 2022-04-18 ENCOUNTER — Inpatient Hospital Stay (HOSPITAL_COMMUNITY): Payer: Medicare HMO

## 2022-04-18 ENCOUNTER — Telehealth (HOSPITAL_COMMUNITY): Payer: Self-pay | Admitting: Pharmacy Technician

## 2022-04-18 DIAGNOSIS — K5792 Diverticulitis of intestine, part unspecified, without perforation or abscess without bleeding: Secondary | ICD-10-CM | POA: Diagnosis not present

## 2022-04-18 LAB — CBC
HCT: 36.8 % (ref 36.0–46.0)
Hemoglobin: 11.8 g/dL — ABNORMAL LOW (ref 12.0–15.0)
MCH: 31.4 pg (ref 26.0–34.0)
MCHC: 32.1 g/dL (ref 30.0–36.0)
MCV: 97.9 fL (ref 80.0–100.0)
Platelets: 121 10*3/uL — ABNORMAL LOW (ref 150–400)
RBC: 3.76 MIL/uL — ABNORMAL LOW (ref 3.87–5.11)
RDW: 14.6 % (ref 11.5–15.5)
WBC: 7.1 10*3/uL (ref 4.0–10.5)
nRBC: 0 % (ref 0.0–0.2)

## 2022-04-18 LAB — COMPREHENSIVE METABOLIC PANEL
ALT: 12 U/L (ref 0–44)
AST: 32 U/L (ref 15–41)
Albumin: 2.1 g/dL — ABNORMAL LOW (ref 3.5–5.0)
Alkaline Phosphatase: 40 U/L (ref 38–126)
Anion gap: 7 (ref 5–15)
BUN: 16 mg/dL (ref 8–23)
CO2: 25 mmol/L (ref 22–32)
Calcium: 8.4 mg/dL — ABNORMAL LOW (ref 8.9–10.3)
Chloride: 102 mmol/L (ref 98–111)
Creatinine, Ser: 0.83 mg/dL (ref 0.44–1.00)
GFR, Estimated: >60 mL/min (ref >60)
Glucose, Bld: 146 mg/dL — ABNORMAL HIGH (ref 70–99)
Potassium: 3.3 mmol/L — ABNORMAL LOW (ref 3.5–5.1)
Sodium: 134 mmol/L — ABNORMAL LOW (ref 135–145)
Total Bilirubin: 0.7 mg/dL (ref 0.3–1.2)
Total Protein: 5.7 g/dL — ABNORMAL LOW (ref 6.5–8.1)

## 2022-04-18 LAB — MAGNESIUM: Magnesium: 1.5 mg/dL — ABNORMAL LOW (ref 1.7–2.4)

## 2022-04-18 MED ORDER — LEVOTHYROXINE SODIUM 100 MCG PO TABS
100.0000 ug | ORAL_TABLET | Freq: Every day | ORAL | Status: DC
  Administered 2022-04-19 – 2022-04-22 (×4): 100 ug via ORAL
  Filled 2022-04-18 (×4): qty 1

## 2022-04-18 MED ORDER — CHOLESTYRAMINE LIGHT 4 G PO PACK: 2.0000 g | PACK | Freq: Once | ORAL | Status: DC

## 2022-04-18 MED ORDER — CHOLESTYRAMINE LIGHT 4 G PO PACK
4.0000 g | PACK | Freq: Once | ORAL | Status: AC
  Administered 2022-04-18: 4 g via ORAL
  Filled 2022-04-18: qty 1

## 2022-04-18 MED ORDER — MAGNESIUM SULFATE 2 GM/50ML IV SOLN
2.0000 g | Freq: Once | INTRAVENOUS | Status: AC
  Administered 2022-04-18: 2 g via INTRAVENOUS
  Filled 2022-04-18: qty 50

## 2022-04-18 MED ORDER — LACTATED RINGERS IV SOLN: INTRAVENOUS | Status: DC

## 2022-04-18 MED ORDER — BOOST PLUS PO LIQD
237.0000 mL | Freq: Three times a day (TID) | ORAL | Status: DC
  Administered 2022-04-18 – 2022-04-22 (×8): 237 mL via ORAL
  Filled 2022-04-18 (×13): qty 237

## 2022-04-18 MED ORDER — GABAPENTIN 100 MG PO CAPS
300.0000 mg | ORAL_CAPSULE | Freq: Two times a day (BID) | ORAL | Status: DC
  Administered 2022-04-19 – 2022-04-22 (×8): 300 mg via ORAL
  Filled 2022-04-18 (×8): qty 3

## 2022-04-18 MED ORDER — GABAPENTIN 400 MG PO CAPS
400.0000 mg | ORAL_CAPSULE | Freq: Every day | ORAL | Status: DC
  Administered 2022-04-19 – 2022-04-21 (×3): 400 mg via ORAL
  Filled 2022-04-18 (×4): qty 1

## 2022-04-18 MED ORDER — FIDAXOMICIN 200 MG PO TABS
200.0000 mg | ORAL_TABLET | Freq: Two times a day (BID) | ORAL | Status: DC
  Administered 2022-04-19 – 2022-04-22 (×8): 200 mg via ORAL
  Filled 2022-04-18 (×9): qty 1

## 2022-04-18 MED ORDER — POTASSIUM CHLORIDE CRYS ER 20 MEQ PO TBCR
40.0000 meq | EXTENDED_RELEASE_TABLET | ORAL | Status: AC
  Administered 2022-04-18 (×2): 40 meq via ORAL
  Filled 2022-04-18 (×2): qty 2

## 2022-04-18 NOTE — Progress Notes (Signed)
  Transition of Care (TOC) Screening Note   Patient Details  Name: Tracy Hunt Date of Birth: Aug 02, 1945   Transition of Care [facility]) CM/SW Contact:    Lennart Pall, LCSW Phone Number: 04/18/2022, 10:40 AM    Transition of Care Department [facility]) has reviewed patient and no TOC needs have been identified at this time. We will continue to monitor patient advancement through interdisciplinary progression rounds. If new patient transition needs arise, please place a TOC consult.

## 2022-04-18 NOTE — Hospital Course (Signed)
PMH of HTN, hypothyroidism, Sjogren's syndrome, IBS, recent hospitalization for diverticulitis, chronic neuropathy, GERD.  Patient presented to the hospital with complaints of recurrence of fever, abdominal pain as well as diarrhea.  Currently being treated for C. difficile colitis.

## 2022-04-18 NOTE — Evaluation (Signed)
Physical Therapy Evaluation Patient Details Name: Tracy Hunt Portal MRN: [id number] DOB: 1945-10-04 Today's Date: 04/18/2022  History of Present Illness  77 year old female with history of hypertension, diverticulitis, gout, glaucoma, chronic constipation, hypothyroidism, Sjogren's disease and peripheral neuropathy.  Patient was recently admitted to the hospital for diverticulitis 04/04/2022- 04/08/2022, to complete oral antibiotics (Augmentin) at home.  Patient is readmitted with symptoms suggestive of diverticulitis/colitis (abdominal pain with diarrhea), stool analysis came back positive for C. difficile.  Clinical Impression  Pt admitted with above diagnosis. Mod assist for bed mobility, min A for sit to stand, pt took several pivotal steps from bed to recliner with RW, distance limited by fatigue. Noted IV site is red and swollen, RN notified. At baseline pt ambulates with a cane when going out, no assistive device in the home, no falls in the past 6 months. She reports feeling quite weak today, hopefully she will regain her strength and will be able to DC home to her family.  Pt currently with functional limitations due to the deficits listed below (see PT Problem List). Pt will benefit from skilled PT to increase their independence and safety with mobility to allow discharge to the venue listed below.          Recommendations for follow up therapy are one component of a multi-disciplinary discharge planning process, led by the attending physician.  Recommendations may be updated based on patient status, additional functional criteria and insurance authorization.  Follow Up Recommendations Home health PT      Assistance Recommended at Discharge Intermittent Supervision/Assistance  Patient can return home with the following  A little help with walking and/or transfers;A little help with bathing/dressing/bathroom;Assistance with cooking/housework;Assist for transportation;Help with stairs  or ramp for entrance    Equipment Recommendations None recommended by PT  Recommendations for Other Services       Functional Status Assessment Patient has had a recent decline in their functional status and demonstrates the ability to make significant improvements in function in a reasonable and predictable amount of time.     Precautions / Restrictions Precautions Precautions: Fall Precaution Comments: denies h/o falls in past 6 months Restrictions Weight Bearing Restrictions: No      Mobility  Bed Mobility Overal bed mobility: Needs Assistance Bed Mobility: Rolling, Sidelying to Sit Rolling: Mod assist Sidelying to sit: Mod assist       General bed mobility comments: assist to raise trunk. Assist to roll L and R for pericare, pt had large BM    Transfers Overall transfer level: Needs assistance Equipment used: Rolling walker (2 wheels) Transfers: Sit to/from Stand, Bed to chair/wheelchair/BSC Sit to Stand: Min assist   Step pivot transfers: Min guard       General transfer comment: pt took several pivotal steps to recliner, distance limited by fatigue, no loss of balance    Ambulation/Gait                  Stairs            Wheelchair Mobility    Modified Rankin (Stroke Patients Only)       Balance Overall balance assessment: Needs assistance   Sitting balance-Leahy Scale: Good     Standing balance support: Bilateral upper extremity supported, During functional activity Standing balance-Leahy Scale: Fair                               Pertinent Vitals/Pain Pain Assessment Pain  Assessment: No/denies pain    Home Living Family/patient expects to be discharged to:: Private residence Living Arrangements: Children Available Help at Discharge: Family;Available PRN/intermittently   Home Access: Stairs to enter   Entrance Stairs-Number of Steps: 3   Home Layout: One level Home Equipment: Cane - single Patent examiner (2 wheels)      Prior Function Prior Level of Function : Independent/Modified Independent             Mobility Comments: uses cane when going out       Hand Dominance        Extremity/Trunk Assessment        Lower Extremity Assessment Lower Extremity Assessment: Generalized weakness;LLE deficits/detail;RLE deficits/detail RLE Deficits / Details: B knee ext +4/5 RLE Sensation: history of peripheral neuropathy;decreased light touch LLE Deficits / Details: knee ext +4/5 LLE Sensation: decreased light touch;history of peripheral neuropathy    Cervical / Trunk Assessment Cervical / Trunk Assessment: Normal  Communication   Communication: No difficulties  Cognition Arousal/Alertness: Awake/alert Behavior During Therapy: WFL for tasks assessed/performed Overall Cognitive Status: Within Functional Limits for tasks assessed                                          General Comments      Exercises     Assessment/Plan    PT Assessment Patient needs continued PT services  PT Problem List Decreased activity tolerance;Decreased mobility       PT Treatment Interventions Gait training;Therapeutic activities;Therapeutic exercise;Patient/family education;Functional mobility training;DME instruction    PT Goals (Current goals can be found in the Care Plan section)  Acute Rehab PT Goals Patient Stated Goal: to get stronger PT Goal Formulation: With patient Time For Goal Achievement: 05/02/22 Potential to Achieve Goals: Good    Frequency Min 3X/week     Co-evaluation               AM-PAC PT "6 Clicks" Mobility  Outcome Measure Help needed turning from your back to your side while in a flat bed without using bedrails?: A Lot Help needed moving from lying on your back to sitting on the side of a flat bed without using bedrails?: A Lot Help needed moving to and from a bed to a chair (including a wheelchair)?: A Little Help needed standing  up from a chair using your arms (e.g., wheelchair or bedside chair)?: A Little Help needed to walk in hospital room?: A Lot Help needed climbing 3-5 steps with a railing? : Total 6 Click Score: 13    End of Session Equipment Utilized During Treatment: Gait belt Activity Tolerance: Patient limited by fatigue Patient left: in chair;with call bell/phone within reach;with nursing/sitter in room Nurse Communication: Mobility status;Other (comment) (IV appears to be infiltrated L forearm red and swollen at IV site) PT Visit Diagnosis: Difficulty in walking, not elsewhere classified (R26.2)    Time: 5027-7412 PT Time Calculation (min) (ACUTE ONLY): 20 min   Charges:   PT Evaluation $PT Eval Moderate Complexity: 1 Mod         Philomena Doheny PT 04/18/2022  Acute Rehabilitation Services  Office [phone]

## 2022-04-18 NOTE — Progress Notes (Signed)
[facility] Progress Note Patient: Tracy Hunt Portal TGG:[id number] DOB: 1945/04/28 DOA: 04/15/2022  DOS: the patient was seen and examined on 04/18/2022  Brief hospital course: PMH of HTN, hypothyroidism, Sjogren's syndrome, IBS, recent hospitalization for diverticulitis, chronic neuropathy, GERD.  Patient presented to the hospital with complaints of recurrence of fever, abdominal pain as well as diarrhea.  Currently being treated for C. difficile colitis. Assessment and Plan: C. difficile sigmoid diverticulitis and colitis. GI was consulted as well as ID. Patient was initially on IV antibiotics but now discontinued. Currently on oral vancomycin.  Due to ongoing fever I will switch her to fidaxomicin. Continues to have diarrhea. Will attempt 1 dose of oral Questran. Will also provide IV fluids. X-ray abdomen on 1/29 shows mildly dilated large sigmoid colon.  Hypokalemia. Hypomagnesemia. Will replace and monitor.  Chronic pancreatic insufficiency. Will continue home regimen.  Possible lactose intolerance. Will avoid lactose including substance.  Thrombocytopenia. Etiology is not clear. Likely dilutional in nature but will monitor. Currently no bleeding.  Anemia. Again dilutional in nature. Will monitor.  Generalized fatigue and deconditioning. PT OT consulted.  Recommended home health.  Hypothyroidism. TSH is actually suppressed and free T4 is elevated and therefore in the setting of diarrhea I will reduce the Synthroid from 112 to 100 mcg.  Chronic neuropathy. On gabapentin.  Dose was erroneously ordered 400 - 400 - 300. Will resuming home regimen of 300 - 300 - 400.  GERD. Was on PPI.  Currently on hold. Monitor.  HLD. Holding statin for now.  History of glaucoma. Continue home eyedrops.  Possible FUO. Patient actually have been having fever intermittently with GI symptoms. GI is worried that the patient could also have a possible adrenal  insufficiency. Will monitor fever curve although currently given recent use of prednisone cortisol level will not be accurate.   Subjective: Continues to have some abdominal discomfort.  Also intermittent nausea.  Minimal oral intake.  No significant distention.  Physical Exam: General: in moderate distress, No Rash Cardiovascular: S1 and S2 Present, No Murmur Respiratory: Good respiratory effort, Bilateral Air entry present. No Crackles, No wheezes Abdomen: Bowel Sound present, mild left-sided tenderness Extremities: No edema Neuro: Alert and oriented x3, no new focal deficit  Data Reviewed: I have Reviewed nursing notes, Vitals, and Lab results. Since last encounter, pertinent lab results CBC and BMP and magnesium   . I have ordered test including CBC and BMP and magnesium  . I have discussed pt's care plan and test results with GI, ID  . I have ordered imaging x-ray abdomen  .   Disposition: Status is: Inpatient Remains inpatient appropriate because: Need for resolution of the fever  enoxaparin (LOVENOX) injection 40 mg Start: 04/16/22 1000   Family Communication: Discussed with daughter at bedside Level of care: Med-Surg  Vitals:   04/18/22 1153 04/18/22 1425 04/18/22 1605 04/18/22 1842  BP: (!) 107/58 108/60 (!) 125/58 133/63  Pulse: 87 95 96 95  Resp: '16 16 14   '$ Temp: 99.3 F (37.4 C) 99.1 F (37.3 C) 99.7 F (37.6 C) (!) 102.7 F (39.3 C)  TempSrc: Oral Oral Oral Oral  SpO2: 94% 94% 97% 100%  Weight:      Height:         Author: Berle Mull, MD 04/18/2022 7:16 PM  Please look on [email or web address] to find out who is on call.

## 2022-04-18 NOTE — Progress Notes (Signed)
Subjective: No new complaints.  Not feeling well overall.  Her daughter states that she is not as talkative as she received gabapentin.  Objective: Vital signs in last 24 hours: Temp:  [97.6 F (36.4 C)-101.5 F (38.6 C)] 99.7 F (37.6 C) (01/29 1605) Pulse Rate:  [87-107] 96 (01/29 1605) Resp:  [14-18] 14 (01/29 1605) BP: (107-153)/(52-92) 125/58 (01/29 1605) SpO2:  [94 %-98 %] 97 % (01/29 1605) Last BM Date : 04/17/22  Intake/Output from previous day: 01/28 0701 - 01/29 0700 In: 1090 [P.O.:1090] Out: 500 [Urine:500] Intake/Output this shift: Total I/O In: 240 [P.O.:240] Out: 200 [Urine:200]  General appearance: alert and no distress GI: mild mid abdominal pain  Lab Results: Recent Labs    04/15/22 1926 04/16/22 0433 04/18/22 1223  WBC 8.9 6.4 7.1  HGB 13.4 12.8 11.8*  HCT 41.7 39.9 36.8  PLT 170 PLATELET CLUMPS NOTED ON SMEAR, UNABLE TO ESTIMATE 121*   BMET Recent Labs    04/15/22 1926 04/16/22 0433 04/18/22 1223  NA 137 135 134*  K 3.7 3.9 3.3*  CL 101 103 102  CO2 '22 24 25  '$ GLUCOSE 96 87 146*  BUN '12 12 16  '$ CREATININE 0.63 0.63 0.83  CALCIUM 9.2 8.7* 8.4*   LFT Recent Labs    04/18/22 1223  PROT 5.7*  ALBUMIN 2.1*  AST 32  ALT 12  ALKPHOS 40  BILITOT 0.7   PT/INR Recent Labs    04/15/22 1926  LABPROT 13.4  INR 1.0   Hepatitis Panel No results for input(s): "HEPBSAG", "HCVAB", "HEPAIGM", "HEPBIGM" in the last 72 hours. C-Diff Recent Labs    04/16/22 1443  CDIFFTOX POSITIVE*   Fecal Lactopherrin No results for input(s): "FECLLACTOFRN" in the last 72 hours.  Studies/Results: DG Abd Portable 1V  Result Date: 04/18/2022 CLINICAL DATA:  Rectosigmoid colitis, currently receiving therapy for C. difficile. Abdominal pain. EXAM: PORTABLE ABDOMEN - 1 VIEW COMPARISON:  CT abdomen 04/16/2022 FINDINGS: There is gas scattered in the colon and small bowel without evidence of obstruction. Paucity of colonic gas in the vicinity of the sigmoid may  correlate with the approximately 30 cm in length region of substantial wall thickening shown on the CT scan from 04/16/2022. A small amount of rectal gas is visible. There is no formed stool in the colon. No radiographic findings of free intraperitoneal gas. 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 IMPRESSION: 1. Paucity of colonic gas in the vicinity of the sigmoid may correlate with the approximately 30 cm in length region of substantial sigmoid colon wall thickening shown on the CT scan from 04/16/2022. 2. No formed stool in the colon. 3. No significantly dilated small bowel or evidence of obstruction or free intraperitoneal gas. 4. 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 Electronically Signed   By: Van Clines M.D.   On: 04/18/2022 12:51    Medications: Scheduled:  brimonidine  1 drop Both Eyes BID   And   timolol  1 drop Both Eyes BID   cholestyramine light  4 g Oral Once   enoxaparin (LOVENOX) injection  40 mg Subcutaneous Q24H   estradiol  1 mg Oral Daily   gabapentin  300 mg Oral BID   [START ON 04/19/2022] gabapentin  400 mg Oral QHS   lactose free nutrition  237 mL Oral TID WC   [START ON 04/19/2022] levothyroxine  100 mcg Oral Q0600   lipase/protease/amylase  72,000 Units Oral TID WC   potassium chloride  40 mEq Oral Q2H   vancomycin  125 mg Oral QID   Continuous:  lactated ringers 75 mL/hr at 04/18/22 1415   magnesium sulfate bolus IVPB      Assessment/Plan: 1) C diff diarrhea. 2) Fever - improving. 3) Pancreatic insufficiency.   With detailed questioning she did report that the volume of the diarrhea was lower, even though she had significant frequency.  There is some form to the diarrhea.  She feels more lethargic today, but this is presumed from the gabapentin.  I am very appreciative of Dr. Doristine Counter input as well as Dr. Storm Frisk.  Hopefully she will continue to improve with vancomycin.   I think one test dose of cholestyramine sufficiently spaced out from vancomycin is a good try.  Dr. Diamantina Providence spoke with Pharmacy.  Plan: 1) Continue with vancomycin. 2) Continue with Creon.   LOS: 2 days   Kiera Hussey D 04/18/2022, 6:25 PM

## 2022-04-18 NOTE — Telephone Encounter (Signed)
Good Afternoon Dr.Stark,  We received records on this patient from her daughter via walk-in. She states her mother is currently admitted to the hospital and has been seen by you. She is requesting this transfer as she feels more comfortable with you.   Please review records and advise on scheduling. Thank you.

## 2022-04-18 NOTE — Progress Notes (Signed)
[place] for Infectious Disease    Date of Admission:  04/15/2022   Total days of antibiotics 4   ID: Tracy Hunt is a 77 y.o. female with  cdifficile colitis/ acute diverticulitis Principal Problem:   Acute diverticulitis Active Problems:   Hypothyroidism   HTN (hypertension)    Subjective: Now afebrile. Some form with her morning stools, but afternoon stools are watery. She denies abdominal pain with stooling. Had peripheral iv that infiltrated early this afternoon (see picture in media)  Medications:   brimonidine  1 drop Both Eyes BID   And   timolol  1 drop Both Eyes BID   cholestyramine light  2 g Oral Once   enoxaparin (LOVENOX) injection  40 mg Subcutaneous Q24H   estradiol  1 mg Oral Daily   gabapentin  300 mg Oral BID   [START ON 04/19/2022] gabapentin  400 mg Oral QHS   lactose free nutrition  237 mL Oral TID WC   [START ON 04/19/2022] levothyroxine  100 mcg Oral Q0600   lipase/protease/amylase  72,000 Units Oral TID WC   potassium chloride  40 mEq Oral Q2H   vancomycin  125 mg Oral QID    Objective: Vital signs in last 24 hours: Temp:  [97.6 F (36.4 C)-103 F (39.4 C)] 99.7 F (37.6 C) (01/29 1605) Pulse Rate:  [82-107] 96 (01/29 1605) Resp:  [14-18] 14 (01/29 1605) BP: (107-153)/(52-92) 125/58 (01/29 1605) SpO2:  [94 %-98 %] 97 % (01/29 1605) Physical Exam  Constitutional:  oriented to person, place, and time. appears well-developed and well-nourished. No distress.  HENT: [name], PERRLA, no scleral icterus Mouth/Throat: Oropharynx is clear and moist. No oropharyngeal exudate.  Cardiovascular: Normal rate, regular rhythm and normal heart sounds. Exam reveals no gallop and no friction rub.  No murmur heard.  Pulmonary/Chest: Effort normal and breath sounds normal. No respiratory distress.  has no wheezes.  Neck = supple, no nuchal rigidity Abdominal: Soft. Bowel sounds are normal.  exhibits no distension. There is no tenderness.   VXB:LTJQ forearm, well circumscribed raised fluctuant area associated at PIV entrance Lymphadenopathy: no cervical adenopathy. No axillary adenopathy Neurological: alert and oriented to person, place, and time.  Skin: Skin is warm and dry. No rash noted. No erythema.  Psychiatric: a normal mood and affect.  behavior is normal.    Lab Results Recent Labs    04/16/22 0433 04/18/22 1223  WBC 6.4 7.1  HGB 12.8 11.8*  HCT 39.9 36.8  NA 135 134*  K 3.9 3.3*  CL 103 102  CO2 24 25  BUN 12 16  CREATININE 0.63 0.83   Liver Panel Recent Labs    04/15/22 1926 04/18/22 1223  PROT 7.1 5.7*  ALBUMIN 3.1* 2.1*  AST 22 32  ALT 12 12  ALKPHOS 48 40  BILITOT 0.8 0.7   Sedimentation Rate No results for input(s): "ESRSEDRATE" in the last 72 hours. C-Reactive Protein No results for input(s): "CRP" in the last 72 hours.  Microbiology: reviewed Studies/Results: DG Abd Portable 1V  Result Date: 04/18/2022 CLINICAL DATA:  Rectosigmoid colitis, currently receiving therapy for C. difficile. Abdominal pain. EXAM: PORTABLE ABDOMEN - 1 VIEW COMPARISON:  CT abdomen 04/16/2022 FINDINGS: There is gas scattered in the colon and small bowel without evidence of obstruction. Paucity of colonic gas in the vicinity of the sigmoid may correlate with the approximately 30 cm in length region of substantial wall thickening shown on the CT scan from 04/16/2022. A small amount  of rectal gas is visible. There is no formed stool in the colon. No radiographic findings of free intraperitoneal gas. 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 IMPRESSION: 1. Paucity of colonic gas in the vicinity of the sigmoid may correlate with the approximately 30 cm in length region of substantial sigmoid colon wall thickening shown on the CT scan from 04/16/2022. 2. No formed stool in the colon. 3. No significantly dilated small bowel or evidence of obstruction or free intraperitoneal gas. 4.  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 Electronically Signed   By: Van Clines M.D.   On: 04/18/2022 12:51     Assessment/Plan: Continue with oral vancomycin, also has added some questran. Will reassess tomorrow to see if any improvement  Infiltrated PIV = have had piv removed. Will watch site for any signs of cellulitis. Appeared fairly quickly, suspect it will resolve by tomorrow.  [facility] for Infectious Diseases Pager: [phone]  04/18/2022, 4:50 PM

## 2022-04-18 NOTE — TOC Benefit Eligibility Note (Signed)
Patient Teacher, English as a foreign language completed.    The patient is currently admitted and upon discharge could be taking Vancomycin 125 mg capsules.  Prior Authorization Required  The patient is insured through [place], [place] Patient Advocate Specialist [place] Patient Advocate Team Direct Number: [phone]  Fax: [phone]

## 2022-04-18 NOTE — Telephone Encounter (Signed)
Patient Advocate Encounter  Prior Authorization for Vancomycin HCl '125MG'$  capsules has been approved.    PA# [id number] Key: [id number] Effective dates: 04/18/2022 through 03/21/2023  Patients co-pay is $72.30.     Lyndel Safe, [place] Patient Advocate Specialist [place] Patient Advocate Team Direct Number: [phone]  Fax: [phone]

## 2022-04-18 NOTE — Progress Notes (Addendum)
   04/18/22 0343  Assess: MEWS Score  Temp (!) 101.5 F (38.6 C)  BP (!) 153/92  MAP (mmHg) 108  Pulse Rate (!) 107  Resp 18  Level of Consciousness Alert  SpO2 97 %  O2 Device Nasal Cannula  O2 Flow Rate (L/min) 2 L/min  Assess: MEWS Score  MEWS Temp 2  MEWS Systolic 0  MEWS Pulse 1  MEWS RR 0  MEWS LOC 0  MEWS Score 3  MEWS Score Color Yellow  Assess: if the MEWS score is Yellow or Red  Were vital signs taken at a resting state? Yes  Focused Assessment No change from prior assessment  Does the patient meet 2 or more of the SIRS criteria? No  MEWS guidelines implemented *See Row Information* Yes  Treat  MEWS Interventions Administered prn meds/treatments  Take Vital Signs  Increase Vital Sign Frequency  Yellow: Q 2hr X 2 then Q 4hr X 2, if remains yellow, continue Q 4hrs  Notify: Charge Nurse/RN  Name of Charge Nurse/RN Notified Treylin Burtch RN/ discussed with other RN on the floor Nallie RN  Date Charge Nurse/RN Notified 04/18/22  Time Charge Nurse/RN Notified 0350  Provider Notification  Provider Name/Title Gershon Cull NO  Date Provider Notified 04/18/22  Time Provider Notified 0350  Method of Notification Page  Notification Reason Change in status  Provider response Other (Comment) (waiting for the NP's response)  Date of Provider Response 04/18/22  Time of Provider Response [phone]  Document  Patient Outcome Other (Comment) (applied ice pack, removed extra blanket, made room cooler.)  Progress note created (see row info) Yes  Assess: SIRS CRITERIA  SIRS Temperature  1  SIRS Pulse 1  SIRS Respirations  0  SIRS WBC 0  SIRS Score Sum  2   Administered Tylenol at 0503 per NP's order.

## 2022-04-19 ENCOUNTER — Other Ambulatory Visit (HOSPITAL_COMMUNITY): Payer: Self-pay

## 2022-04-19 DIAGNOSIS — A0472 Enterocolitis due to Clostridium difficile, not specified as recurrent: Secondary | ICD-10-CM | POA: Diagnosis not present

## 2022-04-19 DIAGNOSIS — K5792 Diverticulitis of intestine, part unspecified, without perforation or abscess without bleeding: Secondary | ICD-10-CM | POA: Diagnosis not present

## 2022-04-19 DIAGNOSIS — R1084 Generalized abdominal pain: Secondary | ICD-10-CM

## 2022-04-19 DIAGNOSIS — R935 Abnormal findings on diagnostic imaging of other abdominal regions, including retroperitoneum: Secondary | ICD-10-CM

## 2022-04-19 LAB — CBC WITH DIFFERENTIAL/PLATELET
Abs Immature Granulocytes: 0.02 10*3/uL (ref 0.00–0.07)
Basophils Absolute: 0 10*3/uL (ref 0.0–0.1)
Basophils Relative: 0 %
Eosinophils Absolute: 0.1 10*3/uL (ref 0.0–0.5)
Eosinophils Relative: 3 %
HCT: 37.4 % (ref 36.0–46.0)
Hemoglobin: 12.3 g/dL (ref 12.0–15.0)
Immature Granulocytes: 0 %
Lymphocytes Relative: 24 %
Lymphs Abs: 1.1 10*3/uL (ref 0.7–4.0)
MCH: 31.8 pg (ref 26.0–34.0)
MCHC: 32.9 g/dL (ref 30.0–36.0)
MCV: 96.6 fL (ref 80.0–100.0)
Monocytes Absolute: 0.3 10*3/uL (ref 0.1–1.0)
Monocytes Relative: 7 %
Neutro Abs: 3.1 10*3/uL (ref 1.7–7.7)
Neutrophils Relative %: 66 %
Platelets: 104 10*3/uL — ABNORMAL LOW (ref 150–400)
RBC: 3.87 MIL/uL (ref 3.87–5.11)
RDW: 14.2 % (ref 11.5–15.5)
WBC: 4.7 10*3/uL (ref 4.0–10.5)
nRBC: 0 % (ref 0.0–0.2)

## 2022-04-19 LAB — BASIC METABOLIC PANEL
Anion gap: 8 (ref 5–15)
BUN: 15 mg/dL (ref 8–23)
CO2: 19 mmol/L — ABNORMAL LOW (ref 22–32)
Calcium: 8.3 mg/dL — ABNORMAL LOW (ref 8.9–10.3)
Chloride: 105 mmol/L (ref 98–111)
Creatinine, Ser: 0.69 mg/dL (ref 0.44–1.00)
GFR, Estimated: >60 mL/min (ref >60)
Glucose, Bld: 115 mg/dL — ABNORMAL HIGH (ref 70–99)
Potassium: 4.2 mmol/L (ref 3.5–5.1)
Sodium: 132 mmol/L — ABNORMAL LOW (ref 135–145)

## 2022-04-19 LAB — LACTATE DEHYDROGENASE: LDH: 143 U/L (ref 98–192)

## 2022-04-19 LAB — CBC
HCT: 35.5 % — ABNORMAL LOW (ref 36.0–46.0)
Hemoglobin: 11.3 g/dL — ABNORMAL LOW (ref 12.0–15.0)
MCH: 31.9 pg (ref 26.0–34.0)
MCHC: 31.8 g/dL (ref 30.0–36.0)
MCV: 100.3 fL — ABNORMAL HIGH (ref 80.0–100.0)
Platelets: 98 10*3/uL — ABNORMAL LOW (ref 150–400)
RBC: 3.54 MIL/uL — ABNORMAL LOW (ref 3.87–5.11)
RDW: 14.6 % (ref 11.5–15.5)
WBC: 5.8 10*3/uL (ref 4.0–10.5)
nRBC: 0 % (ref 0.0–0.2)

## 2022-04-19 LAB — VITAMIN B12: Vitamin B-12: 1794 pg/mL — ABNORMAL HIGH (ref 180–914)

## 2022-04-19 LAB — SEDIMENTATION RATE: Sed Rate: 76 mm/hr — ABNORMAL HIGH (ref 0–22)

## 2022-04-19 LAB — TECHNOLOGIST SMEAR REVIEW: Plt Morphology: DECREASED

## 2022-04-19 LAB — MAGNESIUM: Magnesium: 2.1 mg/dL (ref 1.7–2.4)

## 2022-04-19 LAB — IRON AND TIBC
Iron: 18 ug/dL — ABNORMAL LOW (ref 28–170)
Saturation Ratios: 14 % (ref 10.4–31.8)
TIBC: 126 ug/dL — ABNORMAL LOW (ref 250–450)
UIBC: 108 ug/dL

## 2022-04-19 LAB — C-REACTIVE PROTEIN: CRP: 13.3 mg/dL — ABNORMAL HIGH (ref <1.0)

## 2022-04-19 LAB — FERRITIN: Ferritin: 382 ng/mL — ABNORMAL HIGH (ref 11–307)

## 2022-04-19 LAB — FIBRINOGEN: Fibrinogen: 449 mg/dL (ref 210–475)

## 2022-04-19 LAB — FOLATE: Folate: 15.5 ng/mL (ref >5.9)

## 2022-04-19 MED ORDER — ENOXAPARIN SODIUM 40 MG/0.4ML IJ SOSY
40.0000 mg | PREFILLED_SYRINGE | INTRAMUSCULAR | Status: DC
  Administered 2022-04-19 – 2022-04-22 (×4): 40 mg via SUBCUTANEOUS
  Filled 2022-04-19 (×4): qty 0.4

## 2022-04-19 NOTE — Care Management Important Message (Signed)
Important Message  Patient Details IM Letter given. Name: Tracy Hunt Portal MRN: [id number] Date of Birth: 05/27/1945   Medicare Important Message Given:  Yes     Kerin Salen 04/19/2022, 12:22 PM

## 2022-04-19 NOTE — TOC Transition Note (Signed)
Transition of Care [facility]) - CM/SW Discharge Note   Patient Details  Name: Tracy Hunt Portal MRN: [id number] Date of Birth: 1945-11-22  [facility] of [facility] CM/SW Contact:  Lennart Pall, LCSW Phone Number: 04/19/2022, 3:24 PM   Clinical Narrative:    Spoke with pt and daughter regarding recommendation for HHPT.  They confirm [facility] had been set up at last discharge and agreed to have them resume at discharge.  Orders in for resumption.  Agency contact info on AVS.  No further TOC needs.   Final next level of care: [place] Barriers to Discharge: No Barriers Identified   Patient Goals and CMS Choice      Discharge Placement                         Discharge Plan and Services Additional resources added to the After Visit Summary for                            [facility] Arranged: PT HH Agency: [facility] Date [place]: 04/19/22 Time [place]: [id number] Representative spoke with at [place]: [place] Determinants of Health ([place]) Interventions SDOH Screenings   Food Insecurity: No Food Insecurity (04/16/2022)  Housing: Low Risk  (04/16/2022)  Transportation Needs: No Transportation Needs (04/16/2022)  Utilities: Not At Risk (04/16/2022)  Tobacco Use: Low Risk  (04/16/2022)     Readmission Risk Interventions    04/18/2022   10:40 AM  Readmission Risk Prevention Plan  Transportation Screening Complete  PCP or Specialist Appt within 5-7 Days Complete  Home Care Screening Complete  Medication Review (RN CM) Complete

## 2022-04-19 NOTE — Telephone Encounter (Signed)
I spoke with the patient's daughter Tanija.  They request that Dr. Fuller Plan assume her care while inpatient and her follow up. Per Dr. Fuller Plan okay.  Patient and daughter notified Dr. Benson Norway last night of their request.  I notified Adeleigh that Dr. Fuller Plan will see her in follow up, but he is not covering the hospital this week and Ms. Del Portal will be seen by Dr. Lynne Leader partners while she remains inpatient. She verbalized understanding. Naomi was going to call Dr. Ulyses Amor office and let him know that Dr. Fuller Plan has accepted.

## 2022-04-19 NOTE — Progress Notes (Addendum)
[place] for Infectious Disease    Date of Admission:  04/15/2022   Total days of antibiotics 5           ID: Gerald Dexter Del Portal is a 77 y.o. female with  diarrhea found to have cdiff EIA+/PCR+ plus/minus acute diverticulitis Principal Problem:   Acute diverticulitis Active Problems:   Hypothyroidism   HTN (hypertension)    Subjective: Still febrile up to 102F last night. Had a total of 11 bowel movements across 2 shifts ( inc. 6 last night) had 2 BM this shift (from 7am thru noon). No fever this morning but noticing some cramping with diarrhea. No vomiting. Feels more alert this morning.  Medications:   brimonidine  1 drop Both Eyes BID   And   timolol  1 drop Both Eyes BID   enoxaparin (LOVENOX) injection  40 mg Subcutaneous Q24H   fidaxomicin  200 mg Oral BID   gabapentin  300 mg Oral BID   gabapentin  400 mg Oral QHS   lactose free nutrition  237 mL Oral TID WC   levothyroxine  100 mcg Oral Q0600   lipase/protease/amylase  72,000 Units Oral TID WC    Objective: Vital signs in last 24 hours: Temp:  [97.5 F (36.4 C)-102.8 F (39.3 C)] 97.5 F (36.4 C) (01/30 1003) Pulse Rate:  [80-97] 90 (01/30 1003) Resp:  [14-18] 16 (01/30 1003) BP: (108-145)/(58-75) 133/66 (01/30 1003) SpO2:  [94 %-100 %] 95 % (01/30 1003)  Physical Exam  Constitutional:  oriented to person, place, and time. appears well-developed and well-nourished. No distress.  HENT: [place]/AT, PERRLA, no scleral icterus Mouth/Throat: Oropharynx is clear and moist. No oropharyngeal exudate.  Cardiovascular: Normal rate, regular rhythm and normal heart sounds. Exam reveals no gallop and no friction rub.  No murmur heard.  Pulmonary/Chest: Effort normal and breath sounds normal. No respiratory distress.  has no wheezes.  Neck = supple, no nuchal rigidity Abdominal: Soft. Bowel sounds are normal.  exhibits no distension. There is no tenderness.  Lymphadenopathy: no cervical adenopathy. No axillary  adenopathy Neurological: alert and oriented to person, place, and time.  Skin: Skin is warm and dry. No rash noted. No erythema.  Psychiatric: a normal mood and affect.  behavior is normal.    Lab Results Recent Labs    04/18/22 1223 04/19/22 0442  WBC 7.1 5.8  HGB 11.8* 11.3*  HCT 36.8 35.5*  NA 134* 132*  K 3.3* 4.2  CL 102 105  CO2 25 19*  BUN 16 15  CREATININE 0.83 0.69   Liver Panel Recent Labs    04/18/22 1223  PROT 5.7*  ALBUMIN 2.1*  AST 32  ALT 12  ALKPHOS 40  BILITOT 0.7   Sedimentation Rate No results for input(s): "ESRSEDRATE" in the last 72 hours. C-Reactive Protein No results for input(s): "CRP" in the last 72 hours.  Microbiology: reviewed Studies/Results: DG Abd Portable 1V  Result Date: 04/18/2022 CLINICAL DATA:  Rectosigmoid colitis, currently receiving therapy for C. difficile. Abdominal pain. EXAM: PORTABLE ABDOMEN - 1 VIEW COMPARISON:  CT abdomen 04/16/2022 FINDINGS: There is gas scattered in the colon and small bowel without evidence of obstruction. Paucity of colonic gas in the vicinity of the sigmoid may correlate with the approximately 30 cm in length region of substantial wall thickening shown on the CT scan from 04/16/2022. A small amount of rectal gas is visible. There is no formed stool in the colon. No radiographic findings of free intraperitoneal gas.  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 IMPRESSION: 1. Paucity of colonic gas in the vicinity of the sigmoid may correlate with the approximately 30 cm in length region of substantial sigmoid colon wall thickening shown on the CT scan from 04/16/2022. 2. No formed stool in the colon. 3. No significantly dilated small bowel or evidence of obstruction or free intraperitoneal gas. 4. 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 Electronically Signed   By: Van Clines M.D.   On: 04/18/2022 12:51      Assessment/Plan: Cdifficile colitis = agree with the change top fidaxomicin. Continue to monitor to see if improving with this regimen.would like need 10 day of fidaxomicin plus Q3d dosing thereafter if having slow to response. -would be a good candidate for vowst and zimplava after hospitalization. ( Unless this becomes refractory CDI)  Fever = thought to be due to cdifficile colitis. Will continue to monitor  [facility] for Infectious Diseases Pager: [phone]  04/19/2022, 12:42 PM

## 2022-04-19 NOTE — Progress Notes (Addendum)
Patient ID: Tracy Hunt, female   DOB: [birth year], 77 y.o.   MRN: [id number]    Progress Note   Subjective   Day # 4  CC; Diarrhea/ abd pain, recent diverticulitis ,Cdiff colitis , history of IBS  Vancomycin 125 mg -oral -changed to Dificid last pm Questran one dose yesterday   Febrile to 102.8 last p.m. afebrile this a.m.  KUB 1/29 -paucity of colonic gas in the region of the sigmoid may correlate with the approximately 30 cm in length region of wall thickening shown on CT no dilated small bowel CT 1/27-status post cholecystectomy, marked wall thickening and mucosal hyperenhancement of the sigmoid colon and rectum with mild adjacent pericolonic fat stranding, few sigmoid diverticuli-similar findings to CT of 04/04/2022, large stool load in the right and transverse colon  C. difficile antigen and toxin + 04/17/22 GI path panel negative Labs today -WBC 5.8/hemoglobin 11.3/hematocrit 35.5/platelets 98 Potassium 4.2/BUN 15/creatinine 0.69  Patient is resting in bed, says she feels better than she did yesterday, no further high fever, she was able to tolerate full liquids but says she is generally lactose intolerant, some intermittent mild nausea no vomiting. She had a bad night last evening and last night with 10-11 diarrheal stools.  Since breakfast time this morning she has had 2 bowel movements.    Objective   Vital signs in last 24 hours: Temp:  [97.5 F (36.4 C)-102.8 F (39.3 C)] 97.5 F (36.4 C) (01/30 1003) Pulse Rate:  [80-97] 90 (01/30 1003) Resp:  [14-18] 16 (01/30 1003) BP: (108-145)/(58-75) 133/66 (01/30 1003) SpO2:  [94 %-100 %] 95 % (01/30 1003) Last BM Date : 04/17/22 General:   Elderly white female in NAD, daughter at bedside Heart:  Regular rate and rhythm; no murmurs Lungs: Respirations even and unlabored, lungs CTA bilaterally Abdomen:  Soft, nondistended, bowel sounds active to hyperactive, there is mild tenderness across the lower abdomen no rebound  or guarding  Extremities:  Without edema. Neurologic:  Alert and oriented,  grossly normal neurologically. Psych:  Cooperative. Normal mood and affect.  Intake/Output from previous day: 01/29 0701 - 01/30 0700 In: 803.8 [P.O.:720; I.V.:33.8; IV Piggyback:50] Out: 200 [Urine:200] Intake/Output this shift: Total I/O In: 240 [P.O.:240] Out: -   Lab Results: Recent Labs    04/18/22 1223 04/19/22 0442  WBC 7.1 5.8  HGB 11.8* 11.3*  HCT 36.8 35.5*  PLT 121* 98*   BMET Recent Labs    04/18/22 1223 04/19/22 0442  NA 134* 132*  K 3.3* 4.2  CL 102 105  CO2 25 19*  GLUCOSE 146* 115*  BUN 16 15  CREATININE 0.83 0.69  CALCIUM 8.4* 8.3*   LFT Recent Labs    04/18/22 1223  PROT 5.7*  ALBUMIN 2.1*  AST 32  ALT 12  ALKPHOS 40  BILITOT 0.7   PT/INR No results for input(s): "LABPROT", "INR" in the last 72 hours.  Studies/Results: DG Abd Portable 1V  Result Date: 04/18/2022 CLINICAL DATA:  Rectosigmoid colitis, currently receiving therapy for C. difficile. Abdominal pain. EXAM: PORTABLE ABDOMEN - 1 VIEW COMPARISON:  CT abdomen 04/16/2022 FINDINGS: There is gas scattered in the colon and small bowel without evidence of obstruction. Paucity of colonic gas in the vicinity of the sigmoid may correlate with the approximately 30 cm in length region of substantial wall thickening shown on the CT scan from 04/16/2022. A small amount of rectal gas is visible. There is no formed stool in the colon. No radiographic findings of free  intraperitoneal gas. 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 IMPRESSION: 1. Paucity of colonic gas in the vicinity of the sigmoid may correlate with the approximately 30 cm in length region of substantial sigmoid colon wall thickening shown on the CT scan from 04/16/2022. 2. No formed stool in the colon. 3. No significantly dilated small bowel or evidence of obstruction or free intraperitoneal gas. 4. Levoconvex lumbar  scoliosis with lumbar spondylosis and degenerative disc disease. Electronically Signed   By: Van Clines M.D.   On: 04/18/2022 12:51       Assessment / Plan:    #43 77 year old female with 80-monthhistory of change in bowel habits, with looser stools and alternating bowel habits, lower abdominal discomfort and weight loss. [facility] fecal calprotectin of 610, and fecal elastase low at 67, C. difficile antigen positive then but toxin negative She was started on Creon which they felt did help her symptoms a bit.  She was hospitalized 04/04/2022 and diagnosed with diverticulitis, treated with a course of Rocephin and Flagyl and then discharged home on Augmentin. She finished the Augmentin 1 week ago, then on Friday, 04/15/2022 she developed fevers chills and worsening left lower quadrant pain as well as worsening diarrhea. CT as outlined above shows a long segment of wall thickening and mucosal enhancement of the sigmoid colon and rectum and a few sigmoid colon diverticuli  C. difficile toxin and antigen positive here.  Had initially been started on IV antibiotics and prednisone, both have been discontinued and she was switched to vancomycin. Vancomycin discontinued last evening and switched to Dificid due to fever  Patient feels better today, Tmax 99 range  There is suspicion that she may have an underlying proctosigmoiditis/IBD (markedly elevated fecal calprotectin January 2024) with superimposed C. Difficile.  #2 pancreatic insufficiency-continue Creon #3 anemia-mild stable #4 thrombocytopenia-stable  Plan; will advance to soft bland diet, needs calories and protein Continue Dificid 200 mg p.o. twice daily with initial plans for 10-day course and then possible pulse dosing thereafter depending on response  Will hold on any further Questran for now  GI will follow with you Hopefully if she improves with Dificid, she can  complete a course of Dificid, then plan for colonoscopy as an outpatient for further evaluation for potential underlying IBD with Dr. SFuller Plan    Principal Problem:   Acute diverticulitis Active Problems:   Hypothyroidism   HTN (hypertension)     LOS: 3 days   Amy Esterwood  PA-C1/30/2024, 12:38 PM  GI ATTENDING  Case discussed with Dr. SFuller Plan  Interval history and data reviewed.  Agree with interval progress note as outlined above.  Patient personally seen and examined.  Agree with impressions and plans without additions or deletions.  Will follow  JDocia Chuck PGeri Seminole, M.D. [facility]

## 2022-04-19 NOTE — Progress Notes (Signed)
[facility] Progress Note Patient: Tracy Hunt Portal DEY:[id number] DOB: 01/05/1946 DOA: 04/15/2022  DOS: the patient was seen and examined on 04/19/2022  Brief hospital course: PMH of HTN, hypothyroidism, Sjogren's syndrome, IBS, recent hospitalization for diverticulitis, chronic neuropathy, GERD.  Patient presented to the hospital with complaints of recurrence of fever, abdominal pain as well as diarrhea.  Currently being treated for C. difficile colitis.  Initially was on vancomycin but due to persistent fever now switched to fidaxomicin.  Further workup initiated due to persistent fever. Assessment and Plan: C. difficile sigmoid diverticulitis and colitis. GI was consulted as well as ID. Patient was initially on IV antibiotics for diverticulitis concern, but now discontinued. Initially was on oral vancomycin.  Due to ongoing fever switched to fidaxomicin. Diarrhea frequency is improving after dose of Questran.  3 BMs so far today. X-ray abdomen on 1/29 shows mildly dilated large sigmoid colon. Monitor GI recommendation.  Persistent fever. Patient has evening fever since admission of 102-103. Had a temp of 100.1 on 1/30 as well after transitioning to fidaxomicin. Blood cultures have been negative.  CRP unremarkable.  ESR was mildly abnormal earlier. I will repeat single set of blood cultures.  Sed rate is 76.  CRP is pending.  Will send ANA as well. If persistently having fever, may require further workup to rule out inflammatory pathologies.  Thrombocytopenia. Likely in reaction to infection. Platelet counts were normal now trending down to 98. No active bleeding seen. Will initiate workup with D14, folic acid, fibrinogen, tech smear.  And monitor. Currently continuing DVT prophylaxis.  Hypokalemia. Hypomagnesemia. Corrected.  Chronic pancreatic insufficiency. Will continue home regimen.  Possible lactose intolerance. Will avoid lactose including substance.  Anemia.   Iron deficiency. Again dilutional in nature.  With chronic iron deficiency with iron levels of 18. Will initiate supplementation. Will monitor.  Generalized fatigue and deconditioning. PT OT consulted.  Recommended home health.  Hypothyroidism. TSH is actually suppressed and free T4 is elevated and therefore in the setting of diarrhea I will reduce the Synthroid from 112 to 100 mcg.  Chronic neuropathy. On gabapentin.  Dose was erroneously ordered 400 - 400 - 300. Will resuming home regimen of 300 - 300 - 400.  GERD. Was on PPI.  Currently on hold. Monitor.  HLD. Holding statin for now.  History of glaucoma. Continue home eyedrops.   Subjective: Diarrhea improving.  6 BM last night but 3 BMs so far throughout the day.  No nausea or vomiting.  Abdominal pain still present intermittently, starts after eating and resolves after a BM.  No blood in the stool.  Physical Exam: General: in Mild distress, No Rash Cardiovascular: S1 and S2 Present, No Murmur Respiratory: Good respiratory effort, Bilateral Air entry present. No Crackles, No wheezes Abdomen: Bowel Sound present, mild left-sided tenderness Extremities: No edema Neuro: Alert and oriented x3, no new focal deficit   Data Reviewed: I have Reviewed nursing notes, Vitals, and Lab results. Since last encounter, pertinent lab results CBC and BMP   . I have ordered test including CBC, BMP, fibrinogen, smear, B12, B1, iron levels  . I have discussed pt's care plan and test results with ID  .    Disposition: Status is: Inpatient Remains inpatient appropriate because: Need for resolution of the fever  enoxaparin (LOVENOX) injection 40 mg Start: 04/19/22 1000   Family Communication: Discussed with daughter Level of care: Telemetry continue telemetry due to ongoing diarrhea with electrolyte abnormality.  Vitals:   04/19/22 0230 04/19/22 9702  04/19/22 1003 04/19/22 1414  BP: 136/69 136/75 133/66 (!) 113/55  Pulse: 80 88 90  88  Resp: '18 18 16 16  '$ Temp: 98.4 F (36.9 C) 99.2 F (37.3 C) (!) 97.5 F (36.4 C) 99.5 F (37.5 C)  TempSrc: Oral Oral Oral Oral  SpO2: 99% 98% 95% 98%  Weight:      Height:         Author: Berle Mull, MD 04/19/2022 7:20 PM  Please look on [email or web address] to find out who is on call.

## 2022-04-19 NOTE — TOC Benefit Eligibility Note (Signed)
Patient Teacher, English as a foreign language completed.    The patient is currently admitted and upon discharge could be taking Dificid 200 mg tablets.  The current 10 day co-pay is $1,411.53.   The patient is insured through [employer], [place] Patient Advocate Specialist [place] Patient Advocate Team Direct Number: [phone]  Fax: [phone]

## 2022-04-20 DIAGNOSIS — D696 Thrombocytopenia, unspecified: Secondary | ICD-10-CM

## 2022-04-20 DIAGNOSIS — K5792 Diverticulitis of intestine, part unspecified, without perforation or abscess without bleeding: Secondary | ICD-10-CM | POA: Diagnosis not present

## 2022-04-20 DIAGNOSIS — A0472 Enterocolitis due to Clostridium difficile, not specified as recurrent: Secondary | ICD-10-CM | POA: Diagnosis not present

## 2022-04-20 DIAGNOSIS — E039 Hypothyroidism, unspecified: Secondary | ICD-10-CM | POA: Diagnosis not present

## 2022-04-20 LAB — CBC
HCT: 34.5 % — ABNORMAL LOW (ref 36.0–46.0)
Hemoglobin: 11.4 g/dL — ABNORMAL LOW (ref 12.0–15.0)
MCH: 32 pg (ref 26.0–34.0)
MCHC: 33 g/dL (ref 30.0–36.0)
MCV: 96.9 fL (ref 80.0–100.0)
Platelets: 125 10*3/uL — ABNORMAL LOW (ref 150–400)
RBC: 3.56 MIL/uL — ABNORMAL LOW (ref 3.87–5.11)
RDW: 13.9 % (ref 11.5–15.5)
WBC: 5.4 10*3/uL (ref 4.0–10.5)
nRBC: 0 % (ref 0.0–0.2)

## 2022-04-20 LAB — BASIC METABOLIC PANEL
Anion gap: 7 (ref 5–15)
BUN: 13 mg/dL (ref 8–23)
CO2: 23 mmol/L (ref 22–32)
Calcium: 8.4 mg/dL — ABNORMAL LOW (ref 8.9–10.3)
Chloride: 104 mmol/L (ref 98–111)
Creatinine, Ser: 0.5 mg/dL (ref 0.44–1.00)
GFR, Estimated: >60 mL/min (ref >60)
Glucose, Bld: 99 mg/dL (ref 70–99)
Potassium: 3.1 mmol/L — ABNORMAL LOW (ref 3.5–5.1)
Sodium: 134 mmol/L — ABNORMAL LOW (ref 135–145)

## 2022-04-20 LAB — ENA+DNA/DS+ANTICH+CENTRO+JO...
Anti JO-1: <0.2 AI (ref 0.0–0.9)
Centromere Ab Screen: <0.2 AI (ref 0.0–0.9)
Chromatin Ab SerPl-aCnc: <0.2 AI (ref 0.0–0.9)
ENA SM Ab Ser-aCnc: <0.2 AI (ref 0.0–0.9)
Ribonucleic Protein: 1 AI — ABNORMAL HIGH (ref 0.0–0.9)
SSA (Ro) (ENA) Antibody, IgG: 6.3 AI — ABNORMAL HIGH (ref 0.0–0.9)
SSB (La) (ENA) Antibody, IgG: <0.2 AI (ref 0.0–0.9)
Scleroderma (Scl-70) (ENA) Antibody, IgG: <0.2 AI (ref 0.0–0.9)
ds DNA Ab: 8 IU/mL (ref 0–9)

## 2022-04-20 LAB — ANA W/REFLEX IF POSITIVE: Anti Nuclear Antibody (ANA): POSITIVE — AB

## 2022-04-20 LAB — MAGNESIUM: Magnesium: 1.7 mg/dL (ref 1.7–2.4)

## 2022-04-20 MED ORDER — ZINC OXIDE 40 % EX OINT
TOPICAL_OINTMENT | Freq: Every day | CUTANEOUS | Status: DC | PRN
  Filled 2022-04-20 (×2): qty 57

## 2022-04-20 MED ORDER — POTASSIUM CHLORIDE CRYS ER 20 MEQ PO TBCR
40.0000 meq | EXTENDED_RELEASE_TABLET | ORAL | Status: AC
  Administered 2022-04-20 (×2): 40 meq via ORAL
  Filled 2022-04-20 (×2): qty 2

## 2022-04-20 MED ORDER — MAGNESIUM SULFATE 2 GM/50ML IV SOLN
2.0000 g | Freq: Once | INTRAVENOUS | Status: AC
  Administered 2022-04-20: 2 g via INTRAVENOUS
  Filled 2022-04-20: qty 50

## 2022-04-20 MED ORDER — DICYCLOMINE HCL 10 MG PO CAPS: 10.0000 mg | ORAL_CAPSULE | Freq: Three times a day (TID) | ORAL | Status: DC | PRN

## 2022-04-20 NOTE — Progress Notes (Addendum)
77 y.o patient admitted through the ER with complaints of abdominal pain. Patient is alert and oriented x4 with cranial nerves within normal limits. Assisted patient with bed change and incontinence care with limited assistance. Skin was intact with reddening on the perineal area. Barrier cream applied. Body posture was upright and erect with no signs of deformities. Patient states pain within abdominal area. Patient has normal breathing with no abnormalities present. Pupils are round and reactive to light. Gave patient new gown and lenin as well as water. Patient ate 75% of breakfast and lunch.  Montel Clock, Student Nurse

## 2022-04-20 NOTE — Progress Notes (Addendum)
Patient ID: Tracy Hunt, female   DOB: 02/28/1946, 77 y.o.   MRN: [id number]    Progress Note   Subjective   Day # 5 CC; diarrhea/abdominal pain, recent diverticulitis, C. difficile colitis/history of IBS  Dificid day 3  Tmax 99 5  Labs today-WBC 5.4/hemoglobin 11.4/hematocrit 34.5 stable Sodium 134/potassium 3.1/BUN 13/creatinine 0.5/magnesium 1.7 B12/folate within normal limits Iron 18/TIBC 126/sat 14//ferritin 382  She says overall she feels better, but still weak, still has soreness in the lower abdomen no severe pain, no nausea and has been tolerating a soft diet since yesterday has not been out of bed Patient says she had about 10 episodes of diarrhea last evening some of these were just small squirts of stool and had about 4 episodes overnight and some were just small squirts and others were more volume     Objective   Vital signs in last 24 hours: Temp:  [97.5 F (36.4 C)-99.5 F (37.5 C)] 98.5 F (36.9 C) (01/31 0514) Pulse Rate:  [72-90] 72 (01/31 0514) Resp:  [16-18] 16 (01/31 0514) BP: (113-137)/(55-74) 134/74 (01/31 0514) SpO2:  [95 %-99 %] 98 % (01/31 0514) Last BM Date : 04/19/22 General:   Elderly white female in NAD Heart:  Regular rate and rhythm; no murmurs Lungs: Respirations even and unlabored, lungs CTA bilaterally Abdomen:  Soft, there is mild tenderness across the lower abdomen right greater than left no guarding or rebound bowel sounds present Extremities:  Without edema. Neurologic:  Alert and oriented,  grossly normal neurologically. Psych:  Cooperative. Normal mood and affect.  Intake/Output from previous day: 01/30 0701 - 01/31 0700 In: 600 [P.O.:600] Out: -  Intake/Output this shift: No intake/output data recorded.  Lab Results: Recent Labs    04/19/22 0442 04/19/22 1633 04/20/22 0458  WBC 5.8 4.7 5.4  HGB 11.3* 12.3 11.4*  HCT 35.5* 37.4 34.5*  PLT 98* 104* 125*   BMET Recent Labs    04/18/22 1223 04/19/22 0442  04/20/22 0458  NA 134* 132* 134*  K 3.3* 4.2 3.1*  CL 102 105 104  CO2 25 19* 23  GLUCOSE 146* 115* 99  BUN '16 15 13  '$ CREATININE 0.83 0.69 0.50  CALCIUM 8.4* 8.3* 8.4*   LFT Recent Labs    04/18/22 1223  PROT 5.7*  ALBUMIN 2.1*  AST 32  ALT 12  ALKPHOS 40  BILITOT 0.7   PT/INR No results for input(s): "LABPROT", "INR" in the last 72 hours.  Studies/Results: DG Abd Portable 1V  Result Date: 04/18/2022 CLINICAL DATA:  Rectosigmoid colitis, currently receiving therapy for C. difficile. Abdominal pain. EXAM: PORTABLE ABDOMEN - 1 VIEW COMPARISON:  CT abdomen 04/16/2022 FINDINGS: There is gas scattered in the colon and small bowel without evidence of obstruction. Paucity of colonic gas in the vicinity of the sigmoid may correlate with the approximately 30 cm in length region of substantial wall thickening shown on the CT scan from 04/16/2022. A small amount of rectal gas is visible. There is no formed stool in the colon. No radiographic findings of free intraperitoneal gas. 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 IMPRESSION: 1. Paucity of colonic gas in the vicinity of the sigmoid may correlate with the approximately 30 cm in length region of substantial sigmoid colon wall thickening shown on the CT scan from 04/16/2022. 2. No formed stool in the colon. 3. No significantly dilated small bowel or evidence of obstruction or free intraperitoneal gas. 4. Levoconvex lumbar scoliosis with lumbar spondylosis  and degenerative disc disease. Electronically Signed   By: Van Clines M.D.   On: 04/18/2022 12:51       Assessment / Plan:    #77 77 year old white female with 1-monthhistory of change in bowel habits with looser stools and alternating bowel habits lower abdominal pain [facility] found to have elevated fecal calprotectin of 610 and low fecal elastase at 67 That time C. difficile antigen positive but toxin  negative Started on Creon which did improve her symptoms somewhat Hospitalized 04/04/2022 diagnosed with acute diverticulitis treated with Rocephin and Flagyl then home on Augmentin Completed Augmentin 1/2 04/15/2022 then developed fever chills worsening lower abdominal pain and diarrhea CT here shows a long segment of wall thickening and mucosal enhancement of the sigmoid colon and rectum and a few sigmoid diverticuli no discrete diverticulitis  If toxin positive and antigen positive this admit  Initially started on IV antibiotics and steroids on admit, discontinued started oral vancomycin and then switched to Dificid on 04/18/2022  She is no longer having any fevers and overall feels better, tolerating solid diet Still having quite a bit of diarrhea though admits that some of these episodes are just small squirts of stool.  There is suspicion that she may have an underlying proctosigmoiditis/IBD with markedly elevated fecal calprotectin previously-and now with superimposed C. Difficile  #2 pancreatic insufficiency-continue Creon #3 anemia mild stable more consistent with chronic disease #4 thrombocytopenia-parameters improved  Plan;  Continue soft bland diet Protein supplements as tolerates Continue Dificid 200 mg p.o. twice daily today is day 3-plan is for an initial 10-day course and then consider pulsed dosing to 3 days  Will start Bentyl 10 mg p.o. 3 times daily as needed for cramping-believe this will help with her discomfort and slow stool frequency some  If continued slow improvement here would plan to let her completely resolved C. difficile and plan for colonoscopy as an outpatient with Dr. SFuller Plan Reviewed with ID that [place] therapy for C. [place]   Principal Problem:   Acute diverticulitis Active Problems:   Hypothyroidism   HTN (hypertension)   C. difficile diarrhea   Abnormal CT of the abdomen     LOS: 4 days   Amy  EsterwoodPA-C  04/20/2022, 8:54 AM  GI ATTENDING  Interval history and data reviewed.  Agree with interval progress note, including history and physical exam, as outlined above.  As well, Case reviewed with GI physician assistant.  Agree with assessment and specific plans as outlined.  Will continue to follow.  JDocia Chuck PGeri Seminole, M.D. [facility]uff[facility] CenterDivision of Gastroenterology

## 2022-04-20 NOTE — Progress Notes (Signed)
  Progress Note   Patient: Tracy Hunt KAJ:[id number] DOB: September 25, 1945 DOA: 04/15/2022     4 DOS: the patient was seen and examined on 04/20/2022   Brief hospital course: 77 year old woman PMH including IBS, Sjogren's syndrome presenting with recurrent fever, abdominal pain and diarrhea.  Treating for C. difficile colitis.  Assessment and Plan: C. difficile sigmoid diverticulitis and colitis Initially treated for diverticulitis but subsequently treated for C. difficile with oral vancomycin but given ongoing fever, was switched to fidaxomicin. Seen by GI and infectious disease.  Diarrhea frequency slowly improving.   Persistent fever. Fever thought to be secondary to C. Difficile. Blood cultures negative.  Patient has evening fever since admission of 102-103. CRP and ESR not elevated.  ANA positive but she also has Sjogren's syndrome. As now afebrile will monitor but if has recurrent fever consider inflammatory/autoimmune workup further.   Thrombocytopenia. Platelets have rebounded up to 125 today.  Likely from infection B12 was high.   Hypokalemia.  Repleted. Hypomagnesemia. Replete   Chronic pancreatic insufficiency. Continue Creon   Possible lactose intolerance. Will avoid lactose including substance.   Anemia.  Iron deficiency. Supplement    Generalized fatigue and deconditioning. PT OT consulted.  Recommended home health.   Hypothyroidism. TSH is actually suppressed and free T4 is elevated and therefore in the setting of diarrhea Synthroid reduced from 112 to 100 mcg.   Chronic neuropathy. Gabapentin   GERD. Was on PPI.  Currently on hold. Monitor.   HLD. Holding statin for now.   History of glaucoma. Continue home eyedrops.       Subjective:  Feels ok  Physical Exam: Vitals:   04/19/22 2111 04/20/22 0014 04/20/22 0514 04/20/22 1259  BP: 137/71  134/74 125/72  Pulse: 80  72 73  Resp: '18  16 17  '$ Temp: 98.4 F (36.9 C) 99 F (37.2 C) 98.5  F (36.9 C) 98 F (36.7 C)  TempSrc: Oral Oral Oral Oral  SpO2: 99%  98% 98%  Weight:      Height:       Physical Exam Vitals reviewed.  Constitutional:      General: She is not in acute distress.    Appearance: She is not ill-appearing or toxic-appearing.  Cardiovascular:     Rate and Rhythm: Normal rate and regular rhythm.     Heart sounds: No murmur heard. Pulmonary:     Effort: Pulmonary effort is normal. No respiratory distress.     Breath sounds: No wheezing, rhonchi or rales.  Neurological:     Mental Status: She is alert.  Psychiatric:        Behavior: Behavior normal.     Data Reviewed: K+ 3.1 Plts stable 125  Family Communication: daughter Gladis (also) at bedside  Disposition: Status is: Inpatient Remains inpatient appropriate because: cdiff diarrhea  Planned Discharge Destination: Home health PT    Time spent: 20 minutes  Author: Murray Hodgkins, MD 04/20/2022 5:29 PM  For on call review [email or web address].

## 2022-04-20 NOTE — Progress Notes (Signed)
Physical Therapy Treatment Patient Details Name: Tracy Hunt MRN: [id number] DOB: 1945/04/28 Today's Date: 04/20/2022   History of Present Illness 77 year old female with history of hypertension, diverticulitis, gout, glaucoma, chronic constipation, hypothyroidism, Sjogren's disease and peripheral neuropathy.  Patient was recently admitted to the hospital for diverticulitis 04/04/2022- 04/08/2022, to complete oral antibiotics (Augmentin) at home.  Patient is readmitted with symptoms suggestive of diverticulitis/colitis (abdominal pain with diarrhea), stool analysis came back positive for C. difficile.    PT Comments    Pt progressing well this session, much improved activity tolerance/incr gait distance. Recommend HHPT at d/c, will continue to follow in acute setting  Recommendations for follow up therapy are one component of a multi-disciplinary discharge planning process, led by the attending physician.  Recommendations may be updated based on patient status, additional functional criteria and insurance authorization.  Follow Up Recommendations  Home health PT     Assistance Recommended at Discharge Intermittent Supervision/Assistance  Patient can return home with the following A little help with bathing/dressing/bathroom;Assistance with cooking/housework;Assist for transportation;Help with stairs or ramp for entrance   Equipment Recommendations  None recommended by PT (has RW)    Recommendations for Other Services       Precautions / Restrictions Precautions Precautions: Fall Precaution Comments: denies h/o falls in past 6 months Restrictions Weight Bearing Restrictions: No     Mobility  Bed Mobility Overal bed mobility: Needs Assistance Bed Mobility: Rolling, Sidelying to Sit Rolling: Supervision Sidelying to sit: Supervision       General bed mobility comments: cues to self assist from flat bed, no physical assist    Transfers Overall transfer level: Needs  assistance Equipment used: Rolling walker (2 wheels) Transfers: Sit to/from Stand Sit to Stand: Supervision, Min guard           General transfer comment: cues for hand placement, overall safety; educated on STS for incr endurance/strength    Ambulation/Gait Ambulation/Gait assistance: Min guard, Supervision, Min assist Gait Distance (Feet): 210 Feet Assistive device: Rolling walker (2 wheels) Gait Pattern/deviations: Step-through pattern       General Gait Details: initially unsteady with LOB x1 with head turn; stability improved with distance progressing to supervision   Stairs             Wheelchair Mobility    Modified Rankin (Stroke Patients Only)       Balance     Sitting balance-Leahy Scale: Good     Standing balance support: Reliant on assistive device for balance, During functional activity Standing balance-Leahy Scale: Fair                              Cognition Arousal/Alertness: Awake/alert Behavior During Therapy: WFL for tasks assessed/performed Overall Cognitive Status: Within Functional Limits for tasks assessed                                          Exercises      General Comments General comments (skin integrity, edema, etc.): reviewed ankle pumps, quad sets and heel slides      Pertinent Vitals/Pain Pain Assessment Pain Assessment: No/denies pain    Home Living                          Prior Function  PT Goals (current goals can now be found in the care plan section) Acute Rehab PT Goals Patient Stated Goal: to get stronger PT Goal Formulation: With patient Time For Goal Achievement: 05/02/22 Potential to Achieve Goals: Good Progress towards PT goals: Progressing toward goals    Frequency    Min 3X/week      PT Plan Current plan remains appropriate    Co-evaluation              AM-PAC PT "6 Clicks" Mobility   Outcome Measure  Help needed turning  from your back to your side while in a flat bed without using bedrails?: None Help needed moving from lying on your back to sitting on the side of a flat bed without using bedrails?: A Little Help needed moving to and from a bed to a chair (including a wheelchair)?: A Little Help needed standing up from a chair using your arms (e.g., wheelchair or bedside chair)?: A Little Help needed to walk in hospital room?: A Little Help needed climbing 3-5 steps with a railing? : A Little 6 Click Score: 19    End of Session Equipment Utilized During Treatment: Gait belt Activity Tolerance: Patient tolerated treatment well Patient left: in chair;with call bell/phone within reach;with family/visitor present Nurse Communication: Mobility status PT Visit Diagnosis: Difficulty in walking, not elsewhere classified (R26.2)     Time: 4540-9811 PT Time Calculation (min) (ACUTE ONLY): 24 min  Charges:  $Gait Training: 23-37 mins                     Baxter Flattery, PT  Acute Rehab Dept [facility]) [phone]  [facility] Weekend Pager (Saturday/Sunday only)  [phone]  04/20/2022    [facility] 04/20/2022, 3:47 PM

## 2022-04-20 NOTE — Progress Notes (Signed)
[place] for Infectious Disease    Date of Admission:  04/15/2022   Total days of antibiotics 3rd day of fidaxomicin           ID: Tracy Hunt is a 77 y.o. female with   Principal Problem:   Acute diverticulitis Active Problems:   Hypothyroidism   HTN (hypertension)   C. difficile diarrhea   Abnormal CT of the abdomen    Subjective: Afebrile still having frequent bowel movements, 6 today but decrease in quantity and some with improvement, not as watery per her daughter's report. Patient noticing some abdominal cramping  Medications:   brimonidine  1 drop Both Eyes BID   And   timolol  1 drop Both Eyes BID   enoxaparin (LOVENOX) injection  40 mg Subcutaneous Q24H   fidaxomicin  200 mg Oral BID   gabapentin  300 mg Oral BID   gabapentin  400 mg Oral QHS   lactose free nutrition  237 mL Oral TID WC   levothyroxine  100 mcg Oral Q0600   lipase/protease/amylase  72,000 Units Oral TID WC    Objective: Vital signs in last 24 hours: Temp:  [98 F (36.7 C)-99 F (37.2 C)] 98 F (36.7 C) (01/31 1259) Pulse Rate:  [72-80] 73 (01/31 1259) Resp:  [16-18] 17 (01/31 1259) BP: (125-137)/(71-74) 125/72 (01/31 1259) SpO2:  [98 %-99 %] 98 % (01/31 1259) Physical Exam  Constitutional:  oriented to person, place, and time. appears well-developed and well-nourished. No distress.  HENT: [place]/AT, PERRLA, no scleral icterus Mouth/Throat: Oropharynx is clear and moist. No oropharyngeal exudate.  Cardiovascular: Normal rate, regular rhythm and normal heart sounds. Exam reveals no gallop and no friction rub.  No murmur heard.  Pulmonary/Chest: Effort normal and breath sounds normal. No respiratory distress.  has no wheezes.  Neck = supple, no nuchal rigidity Abdominal: Soft. Bowel sounds are normal.  exhibits no distension. There is no tenderness.  Lymphadenopathy: no cervical adenopathy. No axillary adenopathy Neurological: alert and oriented to person, place, and time.   Skin: Skin is warm and dry. No rash noted. No erythema.  Psychiatric: a normal mood and affect.  behavior is normal.    Lab Results Recent Labs    04/19/22 0442 04/19/22 1633 04/20/22 0458  WBC 5.8 4.7 5.4  HGB 11.3* 12.3 11.4*  HCT 35.5* 37.4 34.5*  NA 132*  --  134*  K 4.2  --  3.1*  CL 105  --  104  CO2 19*  --  23  BUN 15  --  13  CREATININE 0.69  --  0.50   Liver Panel Recent Labs    04/18/22 1223  PROT 5.7*  ALBUMIN 2.1*  AST 32  ALT 12  ALKPHOS 40  BILITOT 0.7   Sedimentation Rate Recent Labs    04/19/22 1633  ESRSEDRATE 76*   C-Reactive Protein Recent Labs    04/19/22 1633  CRP 13.3*     Assessment/Plan: C.difficile colitis= continue with fidaxomicin. Plan for 20 doses/10 days but may extend depending on how patient is doing.  Abdominal cramping =agree with trial of bentyl.  [facility] for Infectious Diseases Pager: [phone]  04/20/2022, 5:30 PM

## 2022-04-21 DIAGNOSIS — D696 Thrombocytopenia, unspecified: Secondary | ICD-10-CM | POA: Diagnosis not present

## 2022-04-21 DIAGNOSIS — A0472 Enterocolitis due to Clostridium difficile, not specified as recurrent: Secondary | ICD-10-CM | POA: Diagnosis not present

## 2022-04-21 DIAGNOSIS — R935 Abnormal findings on diagnostic imaging of other abdominal regions, including retroperitoneum: Secondary | ICD-10-CM | POA: Diagnosis not present

## 2022-04-21 DIAGNOSIS — K5792 Diverticulitis of intestine, part unspecified, without perforation or abscess without bleeding: Secondary | ICD-10-CM | POA: Diagnosis not present

## 2022-04-21 LAB — CULTURE, BLOOD (ROUTINE X 2)
Culture: NO GROWTH
Culture: NO GROWTH
Special Requests: ADEQUATE
Special Requests: ADEQUATE

## 2022-04-21 LAB — CBC
HCT: 34.9 % — ABNORMAL LOW (ref 36.0–46.0)
Hemoglobin: 11.3 g/dL — ABNORMAL LOW (ref 12.0–15.0)
MCH: 31.4 pg (ref 26.0–34.0)
MCHC: 32.4 g/dL (ref 30.0–36.0)
MCV: 96.9 fL (ref 80.0–100.0)
Platelets: 128 10*3/uL — ABNORMAL LOW (ref 150–400)
RBC: 3.6 MIL/uL — ABNORMAL LOW (ref 3.87–5.11)
RDW: 14 % (ref 11.5–15.5)
WBC: 3.7 10*3/uL — ABNORMAL LOW (ref 4.0–10.5)
nRBC: 0 % (ref 0.0–0.2)

## 2022-04-21 LAB — BASIC METABOLIC PANEL
Anion gap: 7 (ref 5–15)
BUN: 12 mg/dL (ref 8–23)
CO2: 25 mmol/L (ref 22–32)
Calcium: 8.4 mg/dL — ABNORMAL LOW (ref 8.9–10.3)
Chloride: 103 mmol/L (ref 98–111)
Creatinine, Ser: 0.6 mg/dL (ref 0.44–1.00)
GFR, Estimated: >60 mL/min (ref >60)
Glucose, Bld: 120 mg/dL — ABNORMAL HIGH (ref 70–99)
Potassium: 3.7 mmol/L (ref 3.5–5.1)
Sodium: 135 mmol/L (ref 135–145)

## 2022-04-21 LAB — MAGNESIUM: Magnesium: 1.7 mg/dL (ref 1.7–2.4)

## 2022-04-21 MED ORDER — DICYCLOMINE HCL 10 MG PO CAPS
10.0000 mg | ORAL_CAPSULE | Freq: Three times a day (TID) | ORAL | Status: DC
  Administered 2022-04-21 – 2022-04-22 (×5): 10 mg via ORAL
  Filled 2022-04-21 (×5): qty 1

## 2022-04-21 NOTE — Progress Notes (Signed)
  Progress Note   Patient: Tracy Hunt GNF:[id number] DOB: 06-28-1945 DOA: 04/15/2022     5 DOS: the patient was seen and examined on 04/21/2022   Brief hospital course: 77 year old woman PMH including IBS, Sjogren's syndrome presenting with recurrent fever, abdominal pain and diarrhea.  Treating for C. difficile colitis.  Assessment and Plan: C. difficile sigmoid diverticulitis and colitis Initially treated for diverticulitis but subsequently treated for C. difficile with oral vancomycin but given ongoing fever, was switched to fidaxomicin. Seen by GI and infectious disease.  Diarrhea frequency slowly improving.  Plan to continue total 10-day course at least.  Follow-up with ID in 2 weeks. Follow-up with Dr. Fuller Plan as an outpatient.   Persistent fever. Fever thought to be secondary to C. Difficile. Blood cultures negative.  Afebrile now 48+ hours. CRP and ESR not elevated.  ANA positive but she also has Sjogren's syndrome. Could consider inflammatory and autoimmune workup as an outpatient if diarrhea does not resolve.   Thrombocytopenia. Platelets up to 128 today.  Follow clinically.   Hypokalemia.  Repleted. Hypomagnesemia. Replete   Chronic pancreatic insufficiency. Continue Creon   Possible lactose intolerance. Will avoid lactose including substance.   Anemia.  Iron deficiency. Supplement    Generalized fatigue and deconditioning. PT OT consulted.  Recommended home health.   Hypothyroidism. TSH is actually suppressed and free T4 is elevated and therefore in the setting of diarrhea Synthroid reduced from 112 to 100 mcg.   Chronic neuropathy. Gabapentin   GERD. Was on PPI.  Currently on hold. Monitor.   HLD. Holding statin for now.   History of glaucoma. Continue home eyedrops.      Subjective:  Feels better Stool more formed Walking   Physical Exam: Vitals:   04/20/22 1259 04/20/22 1959 04/21/22 0611 04/21/22 1308  BP: 125/72 136/69 (!) 145/76  102/71  Pulse: 73 86 63 81  Resp: '17 18 18 17  '$ Temp: 98 F (36.7 C) 99.1 F (37.3 C) 97.7 F (36.5 C) 98 F (36.7 C)  TempSrc: Oral Oral Oral Oral  SpO2: 98% 96% 100% 97%  Weight:      Height:       Physical Exam Vitals reviewed.  Constitutional:      General: She is not in acute distress.    Appearance: She is not ill-appearing or toxic-appearing.  Cardiovascular:     Rate and Rhythm: Regular rhythm.     Heart sounds: No murmur heard. Pulmonary:     Effort: Pulmonary effort is normal. No respiratory distress.     Breath sounds: No wheezing, rhonchi or rales.  Abdominal:     Palpations: Abdomen is soft.  Neurological:     Mental Status: She is alert.  Psychiatric:        Mood and Affect: Mood normal.        Behavior: Behavior normal.    Data Reviewed: BMP noted Mg2+ 1.7 Hgb stable 11.3 Plts stable 128  Family Communication: updated daughter Tracy Hunt by telephone  Disposition: Status is: Inpatient Remains inpatient appropriate because: cdiff  Planned Discharge Destination: Home with Home Health    Time spent: 25 minutes  Author: Murray Hodgkins, MD 04/21/2022 3:56 PM  For on call review [email or web address].

## 2022-04-21 NOTE — Progress Notes (Signed)
Mobility Specialist - Progress Note   04/21/22 0943  Mobility  Activity Ambulated with assistance in hallway  Level of Assistance Modified independent, requires aide device or extra time  Assistive Device Front wheel walker  Distance Ambulated (ft) 750 ft  Activity Response Tolerated well  Mobility Referral Yes  $Mobility charge 1 Mobility   Pt received in recliner and agreeable to mobility. No complaints during session. Pt to recliner after session with all needs met.    [facility]

## 2022-04-21 NOTE — Progress Notes (Addendum)
Progress Note   Subjective  Day #6 Chief Complaint: Diarrhea/abdominal pain, recent diverticulitis, C. difficile colitis/history of IBS  Dificid day 4  Today, the patient tells me that she continues to feel like she is moving in the right direction.  She is keeping record of all of her bowel movements and describes 12 smaller stools over the course of yesterday and into the night.  Her rectum is hurting her so she has been trying various methods to help with this including Desitin and even not wearing a diaper overnight and just calling for help when needed.  Abdominal pain also seems better per patient today.  Tells me she is not sure that she got Bentyl or when (it does not look like this was given at all).   Objective   Vital signs in last 24 hours: Temp:  [97.7 F (36.5 C)-99.1 F (37.3 C)] 97.7 F (36.5 C) (02/01 0611) Pulse Rate:  [63-86] 63 (02/01 0611) Resp:  [17-18] 18 (02/01 0611) BP: (125-145)/(69-76) 145/76 (02/01 0611) SpO2:  [96 %-100 %] 100 % (02/01 0611) Last BM Date : 04/21/22 General:   Elderly female in NAD Heart:  Regular rate and rhythm; no murmurs Lungs: Respirations even and unlabored, lungs CTA bilaterally Abdomen:  Soft, mild generalized ttp and nondistended. Normal bowel sounds. Neurologic:  Alert and oriented,  grossly normal neurologically. Psych:  Cooperative. Normal mood and affect.  Intake/Output from previous day: 01/31 0701 - 02/01 0700 In: 600 [P.O.:600] Out: 0  Intake/Output this shift: Total I/O In: 120 [P.O.:120] Out: -   Lab Results: Recent Labs    04/19/22 1633 04/20/22 0458 04/21/22 0432  WBC 4.7 5.4 3.7*  HGB 12.3 11.4* 11.3*  HCT 37.4 34.5* 34.9*  PLT 104* 125* 128*   BMET Recent Labs    04/19/22 0442 04/20/22 0458 04/21/22 0432  NA 132* 134* 135  K 4.2 3.1* 3.7  CL 105 104 103  CO2 19* 23 25  GLUCOSE 115* 99 120*  BUN '15 13 12  '$ CREATININE 0.69 0.50 0.60  CALCIUM 8.3* 8.4* 8.4*   LFT Recent Labs     04/18/22 1223  PROT 5.7*  ALBUMIN 2.1*  AST 32  ALT 12  ALKPHOS 40  BILITOT 0.7     Assessment / Plan:   Assessment: 1.  Change in bowel habits and abdominal pain: 61-monthhistory of a change in bowel habits with looser stools and alternating bowel habits and lower abdominal pain and weight loss, [facility] found to have elevated fecal calprotectin in 610 and low fecal elastase 67, at that time C. difficile antigen positive but toxin negative, started on Creon which helped some, hospitalized 04/04/2022 diagnosed with acute diverticulitis treated with Rocephin and Flagyl then home on Augmentin, completed Augmentin 1/21 then developed fever/chills/worsening lower abdominal pain and diarrhea, CT at admission here shows a long segment of wall thickening and mucosal enhancement in the sigmoid colon and rectum and a few sigmoid diverticuli, initially started on IV antibiotics and steroids, discontinued Oral vancomycin and switch to Dificid on 04/18/2022 (ID now following), no longer having any fevers, still having quite a bit of loose stools though, suspicion she may have an underlying proctosigmoiditis/IBD with elevated fecal calprotectin previously with now superimposed C. difficile 2.  Pancreatic insufficiency: Continued on Creon 3.  Anemia mild stable: Consistent with chronic disease 4.  Thrombocytopenia  Plan: 1.  Continue current diet 2.  Continue protein supplements as tolerated 3.  Continue Dificid  200 mg p.o. twice daily, this is day 4-plan for initial 10-day course with consideration for pulsing afterwards 4.  It does not look like patient asked for Bentyl yesterday, I have gone ahead and made this scheduled 3 times daily AC to see if this slows down her stool some and is helpful with abdominal discomfort 5.  Again slow improvement, plan to let her completely resolve from C. difficile and consideration of colonoscopy as an outpatient with Dr. Fuller Plan 6.  Continue to  appreciate IDs recommendations  Thank you for your kind consultation, we will continue to follow.   LOS: 5 days   Levin Erp  04/21/2022, 9:46 AM  GI ATTENDING  Interval history data reviewed.  Agree with interval progress note as outlined above including assessment and plans.  Discussed with patient and her daughter yesterday afternoon.  Will follow.  Docia Chuck. Geri [place]in[place]., M.D. [facility] Division of Gastroenterology

## 2022-04-21 NOTE — Progress Notes (Signed)
[place] for Infectious Disease    Date of Admission:  04/15/2022   Total days of antibiotics 4 of fidaxomicin          ID: Tracy Hunt is a 77 y.o. female with pancreatic insufficiency possible IBD with newly diagnosed cdi Principal Problem:   Acute diverticulitis Active Problems:   Hypothyroidism   HTN (hypertension)   C. difficile diarrhea   Abnormal CT of the abdomen    Subjective: Afebrile, having less BM since midnight. Able to sit up in chair this morning and participated with mobility team ambulating. Did have numerous small BM yesterday.  Medications:   brimonidine  1 drop Both Eyes BID   And   timolol  1 drop Both Eyes BID   dicyclomine  10 mg Oral TID AC   enoxaparin (LOVENOX) injection  40 mg Subcutaneous Q24H   fidaxomicin  200 mg Oral BID   gabapentin  300 mg Oral BID   gabapentin  400 mg Oral QHS   lactose free nutrition  237 mL Oral TID WC   levothyroxine  100 mcg Oral Q0600   lipase/protease/amylase  72,000 Units Oral TID WC    Objective: Vital signs in last 24 hours: Temp:  [97.7 F (36.5 C)-99.1 F (37.3 C)] 97.7 F (36.5 C) (02/01 0611) Pulse Rate:  [63-86] 63 (02/01 0611) Resp:  [17-18] 18 (02/01 0611) BP: (125-145)/(69-76) 145/76 (02/01 0611) SpO2:  [96 %-100 %] 100 % (02/01 0355)  Physical Exam  Constitutional:  oriented to person, place, and time. appears well-developed and well-nourished. No distress.  HENT: [place]/AT, PERRLA, no scleral icterus Mouth/Throat: Oropharynx is clear and moist. No oropharyngeal exudate.  Cardiovascular: Normal rate, regular rhythm and normal heart sounds. Exam reveals no gallop and no friction rub.  No murmur heard.  Pulmonary/Chest: Effort normal and breath sounds normal. No respiratory distress.  has no wheezes.  Neck = supple, no nuchal rigidity Abdominal: Soft. Bowel sounds are normal.  mild distension. There is no tenderness.  Lymphadenopathy: no cervical adenopathy. No axillary  adenopathy Neurological: alert and oriented to person, place, and time.  Skin: Skin is warm and dry. No rash noted. No erythema.  Psychiatric: a normal mood and affect.  behavior is normal.    Lab Results Recent Labs    04/20/22 0458 04/21/22 0432  WBC 5.4 3.7*  HGB 11.4* 11.3*  HCT 34.5* 34.9*  NA 134* 135  K 3.1* 3.7  CL 104 103  CO2 23 25  BUN 13 12  CREATININE 0.50 0.60    Sedimentation Rate Recent Labs    04/19/22 1633  ESRSEDRATE 76*   C-Reactive Protein Recent Labs    04/19/22 1633  CRP 13.3*    Microbiology: 04/16/22: cdiff antigen/cdiff toxin  Studies/Results: No results found.   Assessment/Plan: Cdifficile infection/colitis = continues to show some improvement. Will plan to continue with the 10 day course -may extend a few days depending on how she continues to improve. Her frequent diarrhea also maybe IBS/IBD component to reassess after she finishes treatment course.  Pancreatic insufficiency = continue with creon, to follow up with GI if still needs current dosage  Abdominal cramping = appears improving, unclear if she is getting bentyl.   Will set up appt with me for follow up in 2 wk  [facility] for Infectious Diseases Pager: [phone]  04/21/2022, 12:43 PM

## 2022-04-22 ENCOUNTER — Telehealth: Payer: Self-pay

## 2022-04-22 ENCOUNTER — Other Ambulatory Visit (HOSPITAL_COMMUNITY): Payer: Self-pay

## 2022-04-22 DIAGNOSIS — K5792 Diverticulitis of intestine, part unspecified, without perforation or abscess without bleeding: Secondary | ICD-10-CM | POA: Diagnosis not present

## 2022-04-22 DIAGNOSIS — D696 Thrombocytopenia, unspecified: Secondary | ICD-10-CM | POA: Diagnosis not present

## 2022-04-22 DIAGNOSIS — A0472 Enterocolitis due to Clostridium difficile, not specified as recurrent: Secondary | ICD-10-CM

## 2022-04-22 DIAGNOSIS — E039 Hypothyroidism, unspecified: Secondary | ICD-10-CM | POA: Diagnosis not present

## 2022-04-22 LAB — BASIC METABOLIC PANEL
Anion gap: 6 (ref 5–15)
BUN: 16 mg/dL (ref 8–23)
CO2: 25 mmol/L (ref 22–32)
Calcium: 8.9 mg/dL (ref 8.9–10.3)
Chloride: 107 mmol/L (ref 98–111)
Creatinine, Ser: 0.7 mg/dL (ref 0.44–1.00)
GFR, Estimated: >60 mL/min (ref >60)
Glucose, Bld: 112 mg/dL — ABNORMAL HIGH (ref 70–99)
Potassium: 4.3 mmol/L (ref 3.5–5.1)
Sodium: 138 mmol/L (ref 135–145)

## 2022-04-22 LAB — MAGNESIUM: Magnesium: 1.9 mg/dL (ref 1.7–2.4)

## 2022-04-22 MED ORDER — POLYETHYLENE GLYCOL 3350 17 G PO PACK
17.0000 g | PACK | Freq: Every day | ORAL | 0 refills | Status: DC
  Filled 2022-04-22: qty 14, 14d supply, fill #0

## 2022-04-22 MED ORDER — FIDAXOMICIN 200 MG PO TABS
200.0000 mg | ORAL_TABLET | Freq: Two times a day (BID) | ORAL | 0 refills | Status: AC
  Filled 2022-04-22: qty 20, 10d supply, fill #0

## 2022-04-22 MED ORDER — DICYCLOMINE HCL 10 MG PO CAPS
10.0000 mg | ORAL_CAPSULE | Freq: Three times a day (TID) | ORAL | 2 refills | Status: DC
  Filled 2022-04-22: qty 90, 30d supply, fill #0

## 2022-04-22 NOTE — Discharge Summary (Addendum)
Physician Discharge Summary   Patient: Tracy Hunt MRN: [id number] DOB: Oct 28, 1945  Admit date:     04/15/2022  Discharge date: 04/22/22  Discharge Physician: Murray Hodgkins   PCP: Prince Solian, MD   Recommendations at discharge:  C. difficile sigmoid diverticulitis and colitis Initially treated for diverticulitis but subsequently treated for C. difficile with oral vancomycin but given ongoing fever, was switched to fidaxomicin. Seen by GI and infectious disease.  Diarrhea frequency slowly improving.  Plan to continue total 10-day course at least.  Follow-up with ID in 2 weeks. Follow-up with Dr. Fuller Plan as an outpatient.  Generalized fatigue and deconditioning. PT, OT consulted.  Recommended home health.   Hypothyroidism. TSH is actually suppressed and free T4 is elevated. Consider dose reduction.   Discharge Diagnoses: Principal Problem:   Acute diverticulitis Active Problems:   Hypothyroidism   HTN (hypertension)   C. difficile diarrhea   Abnormal CT of the abdomen   Enteritis due to Clostridium difficile  Resolved Problems:   * No resolved hospital problems. *  Hospital Course: 77 year old woman PMH including IBS, Sjogren's syndrome presenting with recurrent fever, abdominal pain and diarrhea.  Treated for C. difficile colitis.  Assessment and Plan: C. difficile sigmoid diverticulitis and colitis Initially treated for diverticulitis but subsequently treated for C. difficile with oral vancomycin but given ongoing fever, was switched to fidaxomicin. Seen by GI and infectious disease.  Diarrhea frequency slowly improving.  Plan to continue total 10-day course at least.  Follow-up with ID in 2 weeks. Follow-up with Dr. Fuller Plan as an outpatient.   Persistent fever. Fever thought to be secondary to C. difficile. Blood cultures negative.  Afebrile now 72+ hours. CRP and ESR not elevated.  ANA positive but she also has Sjogren's syndrome. Could consider  inflammatory and autoimmune workup as an outpatient if diarrhea does not resolve.   Thrombocytopenia. Minimally low, likely from infection.  Follow-up as an outpatient.   Hypokalemia.  Repleted. Hypomagnesemia. Replete   Chronic pancreatic insufficiency. Continue Creon   Possible lactose intolerance. Avoid lactose including substance.   Anemia.  Iron deficiency. Supplement    Generalized fatigue and deconditioning. PT, OT consulted.  Recommended home health.   Hypothyroidism. TSH is actually suppressed and free T4 is elevated and therefore in the setting of diarrhea Synthroid reduced from 112 to 100 mcg.   Chronic neuropathy. Gabapentin   GERD. Was on PPI.  Currently on hold. Monitor.   HLD. Holding statin for now.   History of glaucoma. Continue home eyedrops.  Low back pain Expect will improve in home environment  Consultants:  GI ID  Procedures performed:  None   Disposition: Home health Diet recommendation:  Discharge Diet Orders (From admission, onward)     Start     Ordered   04/22/22 0000  Diet - low sodium heart healthy        04/22/22 1402           Regular diet DISCHARGE MEDICATION: Allergies as of 04/22/2022       Reactions   Cosopt [dorzolamide Hcl-timolol Mal] Anaphylaxis   "Very irritated"   Other Itching   Adhesive tape    Oyster Shell Hives        Medication List     STOP taking these medications    amoxicillin-clavulanate 875-125 MG tablet Commonly known as: AUGMENTIN   glycerin adult 2 g suppository   omeprazole 20 MG capsule Commonly known as: PRILOSEC       TAKE these medications  acetaminophen 650 MG CR tablet Commonly known as: TYLENOL Take 1,300 mg by mouth in the morning, at noon, and at bedtime. Takes 2 tablets in the morning, 2 in the afternoon, and 2 at bedtime.   B-12 1000-400 MCG Subl Place 1 tablet under the tongue daily.   BIOFREEZE EX Apply 1 drop topically daily as needed (back pain).    bismuth subsalicylate 194 MG chewable tablet Commonly known as: PEPTO BISMOL Chew 524 mg by mouth as needed for indigestion or diarrhea or loose stools.   CENTRUM SILVER 50+WOMEN PO Take 1 tablet by mouth daily.   Combigan 0.2-0.5 % ophthalmic solution Generic drug: brimonidine-timolol Place 1 drop into both eyes every 12 (twelve) hours.   Creon 36000 UNITS Cpep capsule Generic drug: lipase/protease/amylase Take 2 capsules (72,000 Units total) by mouth 3 (three) times daily with meals.   dicyclomine 10 MG capsule Commonly known as: BENTYL Take 1 capsule (10 mg total) by mouth 3 (three) times daily before meals.   Dificid 200 MG Tabs tablet Generic drug: fidaxomicin Take 1 tablet (200 mg total) by mouth 2 times daily for 10 days. Complete total 16 doses and keep rest in reserve, on advice of Dr. Baxter Flattery   estradiol 1 MG tablet Commonly known as: ESTRACE Take 1 mg by mouth daily.   gabapentin 100 MG capsule Commonly known as: NEURONTIN Take 1 tablet in the morning and bedtime along with '300mg'$  tablets. What changed:  how much to take how to take this when to take this additional instructions   gabapentin 300 MG capsule Commonly known as: NEURONTIN Take 1 capsule (300 mg total) by mouth 3 (three) times daily. What changed: Another medication with the same name was changed. Make sure you understand how and when to take each.   levothyroxine 112 MCG tablet Commonly known as: SYNTHROID Take 112 mcg by mouth daily.   loperamide 2 MG tablet Commonly known as: IMODIUM A-D Take 2 mg by mouth 4 (four) times daily as needed for diarrhea or loose stools.   magnesium oxide 400 MG tablet Commonly known as: MAG-OX Take 1 tablet (400 mg total) by mouth daily.   polyethylene glycol 17 g packet Commonly known as: MiraLax Take 17 g by mouth daily.   potassium chloride SA 20 MEQ tablet Commonly known as: KLOR-CON M Take 1 tablet (20 mEq total) by mouth daily for 14 days.    rosuvastatin 10 MG tablet Commonly known as: CRESTOR Take 10 mg by mouth at bedtime.   SYSTANE COMPLETE OP Place 1 drop into both eyes 2 (two) times daily as needed (dry eyes).   Vitamin D 50 MCG (2000 UT) tablet Take 2,000 Units by mouth daily.        Follow-up Information     [facility]   Specialty: Home Health Services Why: to resume home physical therapy visits Contact information: [name] [place] [address] [phone]                Feels better Diarrhea forming up Some right low back pain (chronic), with radiating nerve pain to right thing (previous as well) and pain when lifting legg  Discharge Exam: Filed Weights   04/16/22 0011  Weight: 70 kg   Physical Exam Vitals reviewed.  Constitutional:      General: She is not in acute distress.    Appearance: She is not ill-appearing or toxic-appearing.  Cardiovascular:     Rate and Rhythm: Normal  rate and regular rhythm.     Heart sounds: No murmur heard. Pulmonary:     Effort: Pulmonary effort is normal. No respiratory distress.     Breath sounds: No wheezing, rhonchi or rales.  Musculoskeletal:     Comments: Pain limits lifting of right leg  Neurological:     Mental Status: She is alert.  Psychiatric:        Mood and Affect: Mood normal.        Behavior: Behavior normal.      Condition at discharge: good  The results of significant diagnostics from this hospitalization (including imaging, microbiology, ancillary and laboratory) are listed below for reference.   Imaging Studies: DG Abd Portable 1V  Result Date: 04/18/2022 CLINICAL DATA:  Rectosigmoid colitis, currently receiving therapy for C. difficile. Abdominal pain. EXAM: PORTABLE ABDOMEN - 1 VIEW COMPARISON:  CT abdomen 04/16/2022 FINDINGS: There is gas scattered in the colon and small bowel without evidence of obstruction. Paucity of colonic gas in the vicinity of the sigmoid may correlate with the  approximately 30 cm in length region of substantial wall thickening shown on the CT scan from 04/16/2022. A small amount of rectal gas is visible. There is no formed stool in the colon. No radiographic findings of free intraperitoneal gas. 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 IMPRESSION: 1. Paucity of colonic gas in the vicinity of the sigmoid may correlate with the approximately 30 cm in length region of substantial sigmoid colon wall thickening shown on the CT scan from 04/16/2022. 2. No formed stool in the colon. 3. No significantly dilated small bowel or evidence of obstruction or free intraperitoneal gas. 4. 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 Electronically Signed   By: Van Clines M.D.   On: 04/18/2022 12:51   CT ABDOMEN PELVIS W CONTRAST  Result Date: 04/16/2022 CLINICAL DATA:  Left lower quadrant abdominal pain. Fever and urinary incontinence. EXAM: CT ABDOMEN AND PELVIS WITH CONTRAST TECHNIQUE: Multidetector CT imaging of the abdomen and pelvis was performed using the standard protocol following bolus administration of intravenous contrast. RADIATION DOSE REDUCTION: This exam was performed according to the departmental dose-optimization program which includes automated exposure control, adjustment of the mA and/or kV according to patient size and/or use of iterative reconstruction technique. CONTRAST:  125m OMNIPAQUE IOHEXOL 300 MG/ML  SOLN COMPARISON:  CT 04/04/2022 FINDINGS: Lower chest: Bibasilar atelectasis/scarring. Hepatobiliary: Cholecystectomy. No solid hepatic lesion. No biliary dilation. Pancreas: No acute abnormality. Spleen: No acute abnormality. Adrenals/Urinary Tract: No urinary calculi or hydronephrosis. Unremarkable bladder. Normal adrenal glands. Stomach/Bowel: Stomach is nondistended. Normal caliber large and small bowel. Marked wall thickening and mucosal hyperenhancement of the  sigmoid colon and rectum. Mild adjacent pericolonic fat stranding. There are a few sigmoid diverticula. Findings are similar to 04/04/2022. Large colonic stool load in the right and transverse colon. The appendix is not visualized. Vascular/Lymphatic: Aortic atherosclerosis. No enlarged abdominal or pelvic lymph nodes. Reproductive: Hysterectomy. Other: Small amount of free fluid in the pelvis. No free intraperitoneal air. Musculoskeletal: Thoracolumbar spondylosis. No acute osseous abnormality. IMPRESSION: Similar proctitis and sigmoid diverticulitis compared with 04/04/2022. No evidence of perforation or abscess. Consider follow-up colonoscopy following resolution of patient's acute symptoms to exclude underlying mass. Large colonic stool load in the right and transverse colon. Correlate for constipation. Aortic Atherosclerosis (ICD10-I70.0). Electronically Signed   By: TPlacido SouM.D.   On: 04/16/2022 02:21   CT ABDOMEN PELVIS W CONTRAST  Result Date: 04/04/2022 CLINICAL DATA:  Abdominal pain, acute, nonlocalized. Clinical concern for inflammatory bowel disease. EXAM: CT ABDOMEN AND PELVIS WITH CONTRAST TECHNIQUE: Multidetector CT imaging of the abdomen and pelvis was performed using the standard protocol following bolus administration of intravenous contrast. RADIATION DOSE REDUCTION: This exam was performed according to the departmental dose-optimization program which includes automated exposure control, adjustment of the mA and/or kV according to patient size and/or use of iterative reconstruction technique. CONTRAST:  85m OMNIPAQUE IOHEXOL 350 MG/ML SOLN COMPARISON:  03/31/2022. FINDINGS: Lower chest: No acute abnormality.  A small hiatal hernia is noted. Hepatobiliary: No focal liver abnormality is seen. Status post cholecystectomy. No biliary dilatation. Pancreas: Unremarkable. No pancreatic ductal dilatation or surrounding inflammatory changes. Spleen: Normal size.  Calcified granuloma are noted.  Adrenals/Urinary Tract: No adrenal nodule or mass. The kidneys enhance symmetrically. No renal calculus or hydronephrosis. The bladder is unremarkable. Stomach/Bowel: There is a small hiatal hernia. The stomach is within normal limits. No bowel obstruction, free air, or pneumatosis. Scattered diverticula are present along the colon. There is bowel wall thickening involving the sigmoid colon and proximal rectum common decreased. There is persistent pericolonic fat stranding involving the proximal to mid sigmoid colon. No abscess is identified. The appendix is not seen. Vascular/Lymphatic: Aortic atherosclerosis. No enlarged abdominal or pelvic lymph nodes. Reproductive: Status post hysterectomy. No adnexal masses. Other: No abdominopelvic ascites. Fat containing hernia in the ventral abdominal wall in the right upper quadrant. Musculoskeletal: Degenerative changes in the thoracolumbar spine. No acute or suspicious osseous abnormality. IMPRESSION: 1. Decreased bowel wall thickening involving the sigmoid colon and rectum, improved from the prior exam. There are scattered diverticula along the proximal to mid to sigmoid colon with surrounding inflammatory changes suggesting superimposed diverticulitis, unchanged. 2. Aortic atherosclerosis. Electronically Signed   By: LBrett FairyM.D.   On: 04/04/2022 20:24   DG Foot Complete Right  Result Date: 04/04/2022 CLINICAL DATA:  Right foot pain common no known injury, initial encounter EXAM: RIGHT FOOT COMPLETE - 3+ VIEW COMPARISON:  None Available. FINDINGS: Calcaneal spurring is noted. No acute fracture or dislocation is noted. No soft tissue abnormality is seen. Mild tarsal degenerative changes are noted. IMPRESSION: Degenerative change without acute abnormality. Electronically Signed   By: MInez CatalinaM.D.   On: 04/04/2022 19:33   CT ABDOMEN PELVIS W CONTRAST  Addendum Date: 03/31/2022   ADDENDUM REPORT: 03/31/2022 23:07 ADDENDUM: Upon further review, the  inflammatory changes involving the sigmoid colon are relatively extensive involving the majority of the sigmoid colon and extending to involve the rectum and anus. While more severe within the sigmoid colon, the extent of disease involves regions geographically remote from moderate background sigmoid diverticulosis and the imaging findings favor that of a infectious or inflammatory proctocolitis as can be seen with pseudomembranous colitis or inflammatory bowel disease. Moderate colonic stool burden is noted proximal to the inflamed segment without abnormal dilation of this bowel to suggest a resultant obstruction. No free intraperitoneal gas, fluid, or loculated pericolonic fluid collections to suggest perforation. Reactive lymph node and within the sigmoid mesentery. The inferior mesenteric vein and artery appear patent. Electronically Signed   By: AFidela SalisburyM.D.   On: 03/31/2022 23:07   Result Date: 03/31/2022 CLINICAL DATA:  Left lower quadrant abdominal pain EXAM: CT ABDOMEN AND PELVIS WITH CONTRAST TECHNIQUE: Multidetector CT imaging of the abdomen and pelvis was performed using the standard protocol following bolus administration of intravenous contrast. RADIATION DOSE REDUCTION: This exam was performed according to the departmental dose-optimization program which includes  automated exposure control, adjustment of the mA and/or kV according to patient size and/or use of iterative reconstruction technique. CONTRAST:  42m OMNIPAQUE IOHEXOL 300 MG/ML  SOLN COMPARISON:  CT abdomen and pelvis dated January 13, 2022 FINDINGS: Lower chest: Small hiatal hernia.  No acute abnormality. Hepatobiliary: No focal liver abnormality is seen. Status post cholecystectomy. No biliary dilatation. Pancreas: Unremarkable. No pancreatic ductal dilatation or surrounding inflammatory changes. Spleen: Normal in size without focal abnormality. Adrenals/Urinary Tract: Bilateral adrenal glands are unremarkable. No hydronephrosis  nephrolithiasis. Bladder is unremarkable. Stomach/Bowel: Wall thickening and diverticulosis of the sigmoid colon with adjacent inflammatory change. No evidence of obstruction. Vascular/Lymphatic: Aortic atherosclerosis. No enlarged abdominal or pelvic lymph nodes. Reproductive: No adnexal mass. Other: Small fat containing ventral abdominal wall hernia of the upper right anterior abdominal wall. No abdominopelvic ascites. Musculoskeletal: No acute or significant osseous findings. IMPRESSION: 1. Findings compatible with acute uncomplicated sigmoid diverticulitis. 2. Aortic Atherosclerosis (ICD10-I70.0). Electronically Signed: By: LYetta GlassmanM.D. On: 03/31/2022 19:53    Microbiology: Results for orders placed or performed during the hospital encounter of 04/15/22  Blood Culture (routine x 2)     Status: None   Collection Time: 04/15/22  7:26 PM   Specimen: BLOOD  Result Value Ref Range Status   Specimen Description   Final    BLOOD BLOOD RIGHT FOREARM Performed at [facility] [address], [place] [address]   Special Requests   Final    BOTTLES DRAWN AEROBIC AND ANAEROBIC Blood Culture adequate volume Performed at [address], [place] [address]   Culture   Final    NO GROWTH 5 DAYS Performed at [facility] [address] [place] [address]   Report Status 04/21/2022 FINAL  Final  Blood Culture (routine x 2)     Status: None   Collection Time: 04/15/22 10:52 PM   Specimen: BLOOD  Result Value Ref Range Status   Specimen Description   Final    BLOOD BLOOD RIGHT ARM Performed at [address] [place] [address]   Special Requests   Final    BOTTLES DRAWN AEROBIC AND ANAEROBIC Blood Culture adequate volume Performed at [address] [place]  [id number]   Culture   Final    NO GROWTH 5 DAYS Performed at  [facility] [address], [place]  [address]   Report Status 04/21/2022 FINAL  Final  Resp panel by RT-PCR (RSV, Flu A&B, Covid) Anterior Nasal Swab     Status: None   Collection Time: 04/16/22  1:15 AM   Specimen: Anterior Nasal Swab  Result Value Ref Range Status   SARS Coronavirus 2 by RT PCR NEGATIVE NEGATIVE Final    Comment: (NOTE) SARS-CoV-2 target nucleic acids are NOT DETECTED.  The SARS-CoV-2 RNA is generally detectable in upper respiratory specimens during the acute phase of infection. The lowest concentration of SARS-CoV-2 viral copies this assay can detect is 138 copies/mL. A negative result does not preclude SARS-Cov-2 infection and should not be used as the sole basis for treatment or other patient management decisions. A negative result may occur with  improper specimen collection/handling, submission of specimen other than nasopharyngeal swab, presence of viral mutation(s) within the areas targeted by this assay, and inadequate number of viral copies(<138 copies/mL). A negative result must be combined with clinical observations, patient  history, and epidemiological information. The expected result is Negative.  Fact Sheet for Patients:  EntrepreneurPulse.com.au  Fact Sheet for Healthcare Providers:  IncredibleEmployment.be  This test is no t yet approved or cleared by the [place] FDA and  has been authorized for detection and/or diagnosis of SARS-CoV-2 by FDA under an Emergency Use Authorization (EUA). This EUA will remain  in effect (meaning this test can be used) for the duration of the COVID-19 declaration under Section 564(b)(1) of the Act, 21 U.S.C.section 360bbb-3(b)(1), unless the authorization is terminated  or revoked sooner.       Influenza A by PCR NEGATIVE NEGATIVE Final   Influenza B by PCR NEGATIVE NEGATIVE Final    Comment: (NOTE) The Xpert Xpress SARS-CoV-2/FLU/RSV plus assay is  intended as an aid in the diagnosis of influenza from Nasopharyngeal swab specimens and should not be used as a sole basis for treatment. Nasal washings and aspirates are unacceptable for Xpert Xpress SARS-CoV-2/FLU/RSV testing.  Fact Sheet for Patients: EntrepreneurPulse.com.au  Fact Sheet for Healthcare Providers: IncredibleEmployment.be  This test is not yet approved or cleared by the [place] FDA and has been authorized for detection and/or diagnosis of SARS-CoV-2 by FDA under an Emergency Use Authorization (EUA). This EUA will remain in effect (meaning this test can be used) for the duration of the COVID-19 declaration under Section 564(b)(1) of the Act, 21 U.S.C. section 360bbb-3(b)(1), unless the authorization is terminated or revoked.     Resp Syncytial Virus by PCR NEGATIVE NEGATIVE Final    Comment: (NOTE) Fact Sheet for Patients: EntrepreneurPulse.com.au  Fact Sheet for Healthcare Providers: IncredibleEmployment.be  This test is not yet approved or cleared by the [place] FDA and has been authorized for detection and/or diagnosis of SARS-CoV-2 by FDA under an Emergency Use Authorization (EUA). This EUA will remain in effect (meaning this test can be used) for the duration of the COVID-19 declaration under Section 564(b)(1) of the Act, 21 U.S.C. section 360bbb-3(b)(1), unless the authorization is terminated or revoked.  Performed at [facility], [place] [address], [place], [place] [address]   Gastrointestinal Panel by PCR , Stool     Status: None   Collection Time: 04/16/22  2:43 PM   Specimen: Stool  Result Value Ref Range Status   Campylobacter species NOT DETECTED NOT DETECTED Final   Plesimonas shigelloides NOT DETECTED NOT DETECTED Final   Salmonella species NOT DETECTED NOT DETECTED Final   Yersinia enterocolitica NOT DETECTED NOT DETECTED Final   Vibrio  species NOT DETECTED NOT DETECTED Final   Vibrio cholerae NOT DETECTED NOT DETECTED Final   Enteroaggregative E coli (EAEC) NOT DETECTED NOT DETECTED Final   Enteropathogenic E coli (EPEC) NOT DETECTED NOT DETECTED Final   Enterotoxigenic E coli (ETEC) NOT DETECTED NOT DETECTED Final   Shiga like toxin producing E coli (STEC) NOT DETECTED NOT DETECTED Final   Shigella/Enteroinvasive E coli (EIEC) NOT DETECTED NOT DETECTED Final   Cryptosporidium NOT DETECTED NOT DETECTED Final   Cyclospora cayetanensis NOT DETECTED NOT DETECTED Final   Entamoeba histolytica NOT DETECTED NOT DETECTED Final   Giardia lamblia NOT DETECTED NOT DETECTED Final   Adenovirus F40/41 NOT DETECTED NOT DETECTED Final   Astrovirus NOT DETECTED NOT DETECTED Final   Norovirus GI/GII NOT DETECTED NOT DETECTED Final   Rotavirus A NOT DETECTED NOT DETECTED Final   Sapovirus (I, II, IV, and V) NOT DETECTED NOT DETECTED Final    Comment: Performed at [facility], [address] [place],  [place] [address]  C Difficile Quick Screen w PCR reflex     Status: Abnormal   Collection Time: 04/16/22  2:43 PM   Specimen: Stool  Result Value Ref Range Status   C Diff antigen POSITIVE (A) NEGATIVE Final   C Diff toxin POSITIVE (A) NEGATIVE Final   C Diff interpretation Toxin producing C. difficile detected.  Final    Comment: CRITICAL RESULT CALLED TO, READ BACK BY AND VERIFIED WITH: PATTY RN ON 04/16/22 AT 1715 BY GOLSONM Performed at [place] [address] [place], [place] [address]   Culture, blood (single) w Reflex to ID Panel     Status: None (Preliminary result)   Collection Time: 04/19/22  4:33 PM   Specimen: BLOOD RIGHT ARM  Result Value Ref Range Status   Specimen Description   Final    BLOOD RIGHT ARM IN BOTH AEROBIC AND ANAEROBIC BOTTLES Performed at [facility], [place] [address] [place], [place] [address]    Special Requests   Final    BOTTLES DRAWN AEROBIC AND ANAEROBIC  Blood Culture adequate volume Performed at [place] [address], [place], [place] [address]    Culture   Final    NO GROWTH 3 DAYS Performed at [facility], [place] [address], [place], [place] [address]    Report Status PENDING  Incomplete    Labs: CBC: Recent Labs  Lab 04/15/22 1926 04/16/22 0433 04/18/22 1223 04/19/22 0442 04/19/22 1633 04/20/22 0458 04/21/22 0432  WBC 8.9   < > 7.1 5.8 4.7 5.4 3.7*  NEUTROABS 6.9  --   --   --  3.1  --   --   HGB 13.4   < > 11.8* 11.3* 12.3 11.4* 11.3*  HCT 41.7   < > 36.8 35.5* 37.4 34.5* 34.9*  MCV 98.8   < > 97.9 100.3* 96.6 96.9 96.9  PLT 170   < > 121* 98* 104* 125* 128*   < > = values in this interval not displayed.   Basic Metabolic Panel: Recent Labs  Lab 04/18/22 1223 04/19/22 0442 04/20/22 0458 04/21/22 0432 04/22/22 0353  NA 134* 132* 134* 135 138  K 3.3* 4.2 3.1* 3.7 4.3  CL 102 105 104 103 107  CO2 25 19* '23 25 25  '$ GLUCOSE 146* 115* 99 120* 112*  BUN '16 15 13 12 16  '$ CREATININE 0.83 0.69 0.50 0.60 0.70  CALCIUM 8.4* 8.3* 8.4* 8.4* 8.9  MG 1.5* 2.1 1.7 1.7 1.9   Liver Function Tests: Recent Labs  Lab 04/15/22 1926 04/18/22 1223  AST 22 32  ALT 12 12  ALKPHOS 48 40  BILITOT 0.8 0.7  PROT 7.1 5.7*  ALBUMIN 3.1* 2.1*   CBG: No results for input(s): "GLUCAP" in the last 168 hours.  Discharge time spent: greater than 30 minutes.  Signed: Murray Hodgkins, MD [facility] 04/22/2022

## 2022-04-22 NOTE — Telephone Encounter (Signed)
Patient has been scheduled for a 4-wk hospital follow up with Dr. Fuller Plan on Thursday, 05/19/22 at 10:30 am. Anderson [place] notified. Appt information mailed and sent to patient via [place].

## 2022-04-22 NOTE — Progress Notes (Signed)
[facility] for Infectious Disease    Date of Admission:  04/15/2022   Total days of antibiotics 5   ID: Gerald Dexter Del Portal is a 77 y.o. female with  cdifficile colitis. Principal Problem:   Acute diverticulitis Active Problems:   Hypothyroidism   HTN (hypertension)   C. difficile diarrhea   Abnormal CT of the abdomen    Subjective: Had a small formed stool this am. Only 2 bouts of stool since midnight. She had questions regarding what to use if she became constipated  She has right leg/hip pain that she says is consistent "pinched nerve" but she is able to ambulate  Medications:   brimonidine  1 drop Both Eyes BID   And   timolol  1 drop Both Eyes BID   dicyclomine  10 mg Oral TID AC   enoxaparin (LOVENOX) injection  40 mg Subcutaneous Q24H   fidaxomicin  200 mg Oral BID   gabapentin  300 mg Oral BID   gabapentin  400 mg Oral QHS   lactose free nutrition  237 mL Oral TID WC   levothyroxine  100 mcg Oral Q0600   lipase/protease/amylase  72,000 Units Oral TID WC    Objective: Vital signs in last 24 hours: Temp:  [98.5 F (36.9 C)-98.6 F (37 C)] 98.6 F (37 C) (02/02 0625) Pulse Rate:  [86-101] 86 (02/02 0625) Resp:  [16-18] 16 (02/02 0625) BP: (116-125)/(78-84) 116/78 (02/02 0625) SpO2:  [96 %-98 %] 96 % (02/02 6226)  Physical Exam  Constitutional:  oriented to person, place, and time. appears well-developed and well-nourished. No distress.  HENT: [place]/AT, PERRLA, no scleral icterus Mouth/Throat: Oropharynx is clear and moist. No oropharyngeal exudate.  Cardiovascular: Normal rate, regular rhythm and normal heart sounds. Exam reveals no gallop and no friction rub.  No murmur heard.  Pulmonary/Chest: Effort normal and breath sounds normal. No respiratory distress.  has no wheezes.  Neck = supple, no nuchal rigidity Abdominal: Soft. Bowel sounds are normal.  exhibits no distension. There is no tenderness.  Lymphadenopathy: no cervical adenopathy. No  axillary adenopathy Neurological: alert and oriented to person, place, and time.  Skin: Skin is warm and dry. No rash noted. No erythema.  Psychiatric: a normal mood and affect.  behavior is normal.     Lab Results Recent Labs    04/20/22 0458 04/21/22 0432 04/22/22 0353  WBC 5.4 3.7*  --   HGB 11.4* 11.3*  --   HCT 34.5* 34.9*  --   NA 134* 135 138  K 3.1* 3.7 4.3  CL 104 103 107  CO2 '23 25 25  '$ BUN '13 12 16  '$ CREATININE 0.50 0.60 0.70   Liver Panel No results for input(s): "PROT", "ALBUMIN", "AST", "ALT", "ALKPHOS", "BILITOT", "BILIDIR", "IBILI" in the last 72 hours. Sedimentation Rate Recent Labs    04/19/22 1633  ESRSEDRATE 76*   C-Reactive Protein Recent Labs    04/19/22 1633  CRP 13.3*    Microbiology: reviewed Studies/Results: No results found.   Assessment/Plan: Cdifficile colitis = since this is the 2nd day of true improvement. I would have her take 8 more days (16 doses) of fidaxomicin. If she discharges home today, just give 10day course -can keep a few pills in event she relapses  Hx of constipation = I told her to hold off on taking miralax since it would confuse the picture in terms of if her cdifficile infection is improving. Only use if needed  Hypokalemia = corrected since no  longer having diarrhea  Pancreatic insufficiency = continue on creon  Abdominal discomfort = switch bentyl to PRN  Will see back in the ID clinic in 2-3 wk. Will sign off.  [facility] Pager: [phone]  04/22/2022, 2:04 PM

## 2022-04-22 NOTE — Telephone Encounter (Signed)
-----   Message from Levin Erp, [place] sent at 04/22/2022 10:45 AM EST ----- Regarding: ov Needs follow-up with Dr. Fuller Plan or Amy in 3 to 4 weeks.  This will be for C. difficile diarrhea  Thanks, JL L

## 2022-04-22 NOTE — Care Management Important Message (Signed)
Important Message  Patient Details IM Letter given. Name: Tracy Hunt Portal MRN: [id number] Date of Birth: Sep 14, 1945   Medicare Important Message Given:  Yes     Kerin Salen 04/22/2022, 9:47 AM

## 2022-04-22 NOTE — Progress Notes (Addendum)
Progress Note   Subjective  Day #7 Chief Complaint: Diarrhea/abdominal pain, recent diverticulitis, C. difficile colitis/history of IBS  Today, patient tells me that she is feeling good.  It seems like when she was recording her "bowel movements" before she was really just recording visits to the commode and in fact she only had 2 actual bowel movements over the past 24 hours, she tells me she has been sleeping and just setting a timer every 3 hours to get up to go to the bathroom but oftentimes this is just urination because she is afraid to mess herself.  She has very minimal abdominal pain and is very happy with everything that she is doing.  She thinks that she can do everything she is doing here at home.  Her bottom is feeling better after applying Desitin for a few days.  She is having some right hip pain and having trouble lifting her leg while sitting in the chair but she is just up ambulating with PT and they said she has no trouble walking.   Objective   Vital signs in last 24 hours: Temp:  [98 F (36.7 C)-98.6 F (37 C)] 98.6 F (37 C) (02/02 0625) Pulse Rate:  [81-101] 86 (02/02 0625) Resp:  [16-18] 16 (02/02 0625) BP: (102-125)/(71-84) 116/78 (02/02 0625) SpO2:  [96 %-98 %] 96 % (02/02 0625) Last BM Date : 04/21/22 General:    Elderly female in NAD Heart:  Regular rate and rhythm; no murmurs Lungs: Respirations even and unlabored, lungs CTA bilaterally Abdomen:  Soft, nontender and nondistended. Normal bowel sounds. Psych:  Cooperative. Normal mood and affect.  Intake/Output from previous day: 02/01 0701 - 02/02 0700 In: 1020 [P.O.:1020] Out: -  Intake/Output this shift: Total I/O In: 360 [P.O.:360] Out: -   Lab Results: Recent Labs    04/19/22 1633 04/20/22 0458 04/21/22 0432  WBC 4.7 5.4 3.7*  HGB 12.3 11.4* 11.3*  HCT 37.4 34.5* 34.9*  PLT 104* 125* 128*   BMET Recent Labs    04/20/22 0458 04/21/22 0432 04/22/22 0353  NA 134* 135 138  K 3.1*  3.7 4.3  CL 104 103 107  CO2 '23 25 25  '$ GLUCOSE 99 120* 112*  BUN '13 12 16  '$ CREATININE 0.50 0.60 0.70  CALCIUM 8.4* 8.4* 8.9    Assessment / Plan:   Assessment: 1.  Change in bowel habits and abdominal pain:71-monthhistory of a change in bowel habits with looser stools and alternating bowel habits and lower abdominal pain and weight loss[facility] fecal calprotectin in 610 and low fecal elastase 67, at that time C. difficile antigen positive but toxin negative, started on Creon which helped some, hospitalized 04/04/2022 diagnosed with acute diverticulitis treated with Rocephin and Flagyl then home on Augmentin, completed Augmentin 1/21 then developed fever/chills/worsening lower abdominal pain and diarrhea, CT at admission here shows a long segment of wall thickening and mucosal enhancement in the sigmoid colon and rectum and a few sigmoid diverticuli, initially started on IV antibiotics and steroids, discontinued Oral vancomycin and switch to Dificid on 04/18/2022 (ID now following), no longer having any fevers, tells me now she is only had 2 bowel movements over the past 24 hours and they were both semisolid, now worried about returning to constipation 2.  Pancreatic insufficiency: Continued on Creon 3.  Anemia mild stable: Consistent with chronic disease 4.  Thrombocytopenia  Plan: 1.  Continue current diet 2.  Continue  Dificid 200 mg p.o. twice daily, plan as per ID 3.  Continue Bentyl to be prescribed 10 mg every 4-6 hours as needed for abdominal cramping #30 with 1 refill and the patient goes home 4.  Patient feels like she would be able to go home in her current state.  I will let the hospitalist team know. 5.  Patient is worried about getting constipated when she gets home, discussed possibly adding in MiraLAX daily and titrating.  We discussed this in detail. 6.  Will set up for follow-up in our clinic in the next 3 to 4 weeks with Dr. Fuller Plan or  Amy  Thank you for your kind consultation, we will sign off.   LOS: 6 days   Levin Erp  04/22/2022, 10:36 AM  GI ATTENDING  Interval history data reviewed.  Patient personally seen.  Agree with comprehensive consultation note as outlined above.  She is feeling much better.  Sitting up and eating regular diet.  Perianal skin soreness improved.  No more diarrhea.  Recommendations as outlined above.  The patient has a follow-up appointment with Dr. Fuller Plan on February 29.  She is going home tonight.  Will sign off.  Docia Chuck. Geri Seminole., M.D. [facility] Division of Gastroenterology

## 2022-04-22 NOTE — Progress Notes (Addendum)
Physical Therapy Treatment Patient Details Name: Tracy Hunt MRN: [id number] DOB: 1945/10/19 Today's Date: 04/22/2022   History of Present Illness 77 year old female with history of hypertension, diverticulitis, gout, glaucoma, chronic constipation, hypothyroidism, Sjogren's disease and peripheral neuropathy.  Patient was recently admitted to the hospital for diverticulitis 04/04/2022- 04/08/2022, to complete oral antibiotics (Augmentin) at home.  Patient is readmitted with symptoms suggestive of diverticulitis/colitis (abdominal pain with diarrhea), stool analysis came back positive for C. difficile.    PT Comments    Pt is mobilizing well, she ambulated 200' independently with RW without loss of balance. She reports new R lateral hip and anterior thigh pain and was unable to elevate RLE to perform marching exercise in seated position. Pt states she has a h/o back pain and a "pinched nerve" in her back. No buckling of RLE with ambulation. MD notified of new hip/thigh pain. Recommend f/u with primary MD if hip pain/weakness persists. She has met PT goals and is ready to DC home from a PT standpoint. PT signing off.    Recommendations for follow up therapy are one component of a multi-disciplinary discharge planning process, led by the attending physician.  Recommendations may be updated based on patient status, additional functional criteria and insurance authorization.  Follow Up Recommendations  Home health PT     Assistance Recommended at Discharge Set up Supervision/Assistance  Patient can return home with the following A little help with bathing/dressing/bathroom;Assistance with cooking/housework;Assist for transportation;Help with stairs or ramp for entrance   Equipment Recommendations  None recommended by PT    Recommendations for Other Services       Precautions / Restrictions Precautions Precautions: Fall Precaution Comments: denies h/o falls in past 6  months Restrictions Weight Bearing Restrictions: No     Mobility  Bed Mobility               General bed mobility comments: up in recliner    Transfers Overall transfer level: Needs assistance Equipment used: Rolling walker (2 wheels) Transfers: Sit to/from Stand Sit to Stand: Supervision           General transfer comment: cues for hand placement    Ambulation/Gait Ambulation/Gait assistance: Modified independent (Device/Increase time) Gait Distance (Feet): 200 Feet Assistive device: Rolling walker (2 wheels) Gait Pattern/deviations: WFL(Within Functional Limits) Gait velocity: WFL     General Gait Details: steady, no loss of balance   Stairs             Wheelchair Mobility    Modified Rankin (Stroke Patients Only)       Balance     Sitting balance-Leahy Scale: Good     Standing balance support: Reliant on assistive device for balance, During functional activity Standing balance-Leahy Scale: Fair                              Cognition Arousal/Alertness: Awake/alert Behavior During Therapy: WFL for tasks assessed/performed Overall Cognitive Status: Within Functional Limits for tasks assessed                                          Exercises General Exercises - Lower Extremity Hip Flexion/Marching: Limitations, Seated Hip Flexion/Marching Limitations: pt unable to perform marching in sitting on R due to pain in lateral hip and anterior thigh area, pt reports this is new. She's able to  perform marching in sitting on LLE.    General Comments        Pertinent Vitals/Pain Pain Assessment Pain Assessment: Faces Faces Pain Scale: Hurts even more Pain Location: R hip with attempted hip flexion Pain Descriptors / Indicators: Aching, Sore, Grimacing Pain Intervention(s): Limited activity within patient's tolerance, Monitored during session, Premedicated before session    Home Living                           Prior Function            PT Goals (current goals can now be found in the care plan section) Acute Rehab PT Goals Patient Stated Goal: to get stronger PT Goal Formulation: With patient Time For Goal Achievement: 05/02/22 Potential to Achieve Goals: Good Progress towards PT goals: Goals met/education completed, patient discharged from PT    Frequency    Min 3X/week      PT Plan Current plan remains appropriate    Co-evaluation              AM-PAC PT "6 Clicks" Mobility   Outcome Measure  Help needed turning from your back to your side while in a flat bed without using bedrails?: None Help needed moving from lying on your back to sitting on the side of a flat bed without using bedrails?: None Help needed moving to and from a bed to a chair (including a wheelchair)?: None Help needed standing up from a chair using your arms (e.g., wheelchair or bedside chair)?: None Help needed to walk in hospital room?: None Help needed climbing 3-5 steps with a railing? : None 6 Click Score: 24    End of Session Equipment Utilized During Treatment: Gait belt Activity Tolerance: Patient tolerated treatment well Patient left: in chair;with call bell/phone within reach Nurse Communication: Mobility status PT Visit Diagnosis: Difficulty in walking, not elsewhere classified (R26.2)     Time: 1694-5038 PT Time Calculation (min) (ACUTE ONLY): 13 min  Charges:  $Gait Training: 8-22 mins                     Blondell Reveal Kistler PT 04/22/2022  Acute Rehabilitation Services  Office [phone]

## 2022-04-23 LAB — VITAMIN B1: Vitamin B1 (Thiamine): 83.9 nmol/L (ref 66.5–200.0)

## 2022-04-24 DIAGNOSIS — A0472 Enterocolitis due to Clostridium difficile, not specified as recurrent: Secondary | ICD-10-CM | POA: Diagnosis not present

## 2022-04-24 DIAGNOSIS — D509 Iron deficiency anemia, unspecified: Secondary | ICD-10-CM | POA: Diagnosis not present

## 2022-04-24 DIAGNOSIS — I700 Atherosclerosis of aorta: Secondary | ICD-10-CM | POA: Diagnosis not present

## 2022-04-24 DIAGNOSIS — I10 Essential (primary) hypertension: Secondary | ICD-10-CM | POA: Diagnosis not present

## 2022-04-24 DIAGNOSIS — D696 Thrombocytopenia, unspecified: Secondary | ICD-10-CM | POA: Diagnosis not present

## 2022-04-24 DIAGNOSIS — G510 Bell's palsy: Secondary | ICD-10-CM | POA: Diagnosis not present

## 2022-04-24 DIAGNOSIS — K5732 Diverticulitis of large intestine without perforation or abscess without bleeding: Secondary | ICD-10-CM | POA: Diagnosis not present

## 2022-04-24 DIAGNOSIS — R197 Diarrhea, unspecified: Secondary | ICD-10-CM | POA: Diagnosis not present

## 2022-04-24 DIAGNOSIS — K589 Irritable bowel syndrome without diarrhea: Secondary | ICD-10-CM | POA: Diagnosis not present

## 2022-04-24 LAB — CULTURE, BLOOD (SINGLE)
Culture: NO GROWTH
Special Requests: ADEQUATE

## 2022-04-26 ENCOUNTER — Other Ambulatory Visit: Payer: Self-pay

## 2022-04-26 ENCOUNTER — Other Ambulatory Visit (HOSPITAL_COMMUNITY): Payer: Self-pay

## 2022-04-26 ENCOUNTER — Ambulatory Visit: Payer: Medicare HMO | Admitting: Obstetrics and Gynecology

## 2022-04-26 DIAGNOSIS — R351 Nocturia: Secondary | ICD-10-CM | POA: Diagnosis not present

## 2022-04-26 DIAGNOSIS — E876 Hypokalemia: Secondary | ICD-10-CM | POA: Diagnosis not present

## 2022-04-26 DIAGNOSIS — K8689 Other specified diseases of pancreas: Secondary | ICD-10-CM | POA: Diagnosis not present

## 2022-04-26 DIAGNOSIS — R600 Localized edema: Secondary | ICD-10-CM | POA: Diagnosis not present

## 2022-04-26 DIAGNOSIS — M5416 Radiculopathy, lumbar region: Secondary | ICD-10-CM | POA: Diagnosis not present

## 2022-04-26 DIAGNOSIS — A0472 Enterocolitis due to Clostridium difficile, not specified as recurrent: Secondary | ICD-10-CM | POA: Diagnosis not present

## 2022-04-26 DIAGNOSIS — E871 Hypo-osmolality and hyponatremia: Secondary | ICD-10-CM | POA: Diagnosis not present

## 2022-04-26 DIAGNOSIS — K5792 Diverticulitis of intestine, part unspecified, without perforation or abscess without bleeding: Secondary | ICD-10-CM | POA: Diagnosis not present

## 2022-04-26 MED ORDER — METHOCARBAMOL 500 MG PO TABS
500.0000 mg | ORAL_TABLET | Freq: Three times a day (TID) | ORAL | 0 refills | Status: DC | PRN
  Filled 2022-04-26 (×2): qty 60, 20d supply, fill #0

## 2022-04-27 ENCOUNTER — Other Ambulatory Visit: Payer: Self-pay

## 2022-04-28 ENCOUNTER — Telehealth: Payer: Self-pay | Admitting: Gastroenterology

## 2022-04-28 DIAGNOSIS — R197 Diarrhea, unspecified: Secondary | ICD-10-CM | POA: Diagnosis not present

## 2022-04-28 DIAGNOSIS — A0472 Enterocolitis due to Clostridium difficile, not specified as recurrent: Secondary | ICD-10-CM | POA: Diagnosis not present

## 2022-04-28 DIAGNOSIS — G510 Bell's palsy: Secondary | ICD-10-CM | POA: Diagnosis not present

## 2022-04-28 DIAGNOSIS — D509 Iron deficiency anemia, unspecified: Secondary | ICD-10-CM | POA: Diagnosis not present

## 2022-04-28 DIAGNOSIS — K589 Irritable bowel syndrome without diarrhea: Secondary | ICD-10-CM | POA: Diagnosis not present

## 2022-04-28 DIAGNOSIS — I10 Essential (primary) hypertension: Secondary | ICD-10-CM | POA: Diagnosis not present

## 2022-04-28 DIAGNOSIS — K5732 Diverticulitis of large intestine without perforation or abscess without bleeding: Secondary | ICD-10-CM | POA: Diagnosis not present

## 2022-04-28 DIAGNOSIS — I700 Atherosclerosis of aorta: Secondary | ICD-10-CM | POA: Diagnosis not present

## 2022-04-28 DIAGNOSIS — D696 Thrombocytopenia, unspecified: Secondary | ICD-10-CM | POA: Diagnosis not present

## 2022-04-28 NOTE — Telephone Encounter (Signed)
Inbound call from patient is requesting refill for Creon. [facility] on [address].

## 2022-04-28 NOTE — Telephone Encounter (Signed)
Patient is requesting a refill of Creon. You are not currently the prescribing physician but you did accept transfer of care. She is scheduled to see you in the office on 05/19/22 at 10:30am. Please advise Dr. Fuller Plan if patient can have refill.

## 2022-04-28 NOTE — Telephone Encounter (Signed)
OK to refill Creon as currently prescribed

## 2022-04-29 ENCOUNTER — Other Ambulatory Visit (HOSPITAL_COMMUNITY): Payer: Self-pay

## 2022-04-29 ENCOUNTER — Other Ambulatory Visit: Payer: Self-pay

## 2022-04-29 MED ORDER — PANCRELIPASE (LIP-PROT-AMYL) 36000-114000 UNITS PO CPEP
72000.0000 [IU] | ORAL_CAPSULE | Freq: Three times a day (TID) | ORAL | 1 refills | Status: DC
  Filled 2022-04-29: qty 200, 34d supply, fill #0
  Filled 2022-05-31: qty 200, 34d supply, fill #1

## 2022-04-29 MED ORDER — PANCRELIPASE (LIP-PROT-AMYL) 36000-114000 UNITS PO CPEP: 72000.0000 [IU] | ORAL_CAPSULE | Freq: Three times a day (TID) | ORAL | 1 refills | Status: DC

## 2022-04-29 NOTE — Telephone Encounter (Signed)
Informed patient that the prescription for Creon has been sent to her pharmacy as requested. Patient verbalized understanding.

## 2022-05-03 DIAGNOSIS — I10 Essential (primary) hypertension: Secondary | ICD-10-CM | POA: Diagnosis not present

## 2022-05-03 DIAGNOSIS — A0472 Enterocolitis due to Clostridium difficile, not specified as recurrent: Secondary | ICD-10-CM | POA: Diagnosis not present

## 2022-05-03 DIAGNOSIS — K589 Irritable bowel syndrome without diarrhea: Secondary | ICD-10-CM | POA: Diagnosis not present

## 2022-05-03 DIAGNOSIS — D509 Iron deficiency anemia, unspecified: Secondary | ICD-10-CM | POA: Diagnosis not present

## 2022-05-03 DIAGNOSIS — D696 Thrombocytopenia, unspecified: Secondary | ICD-10-CM | POA: Diagnosis not present

## 2022-05-03 DIAGNOSIS — I700 Atherosclerosis of aorta: Secondary | ICD-10-CM | POA: Diagnosis not present

## 2022-05-03 DIAGNOSIS — R197 Diarrhea, unspecified: Secondary | ICD-10-CM | POA: Diagnosis not present

## 2022-05-03 DIAGNOSIS — K5732 Diverticulitis of large intestine without perforation or abscess without bleeding: Secondary | ICD-10-CM | POA: Diagnosis not present

## 2022-05-03 DIAGNOSIS — G510 Bell's palsy: Secondary | ICD-10-CM | POA: Diagnosis not present

## 2022-05-05 ENCOUNTER — Ambulatory Visit: Payer: Medicare HMO | Admitting: Internal Medicine

## 2022-05-06 DIAGNOSIS — E039 Hypothyroidism, unspecified: Secondary | ICD-10-CM | POA: Diagnosis not present

## 2022-05-06 DIAGNOSIS — E785 Hyperlipidemia, unspecified: Secondary | ICD-10-CM | POA: Diagnosis not present

## 2022-05-09 ENCOUNTER — Encounter: Payer: Self-pay | Admitting: *Deleted

## 2022-05-10 ENCOUNTER — Ambulatory Visit (INDEPENDENT_AMBULATORY_CARE_PROVIDER_SITE_OTHER): Payer: Medicare HMO | Admitting: Internal Medicine

## 2022-05-10 ENCOUNTER — Other Ambulatory Visit: Payer: Self-pay

## 2022-05-10 ENCOUNTER — Encounter: Payer: Self-pay | Admitting: Internal Medicine

## 2022-05-10 VITALS — BP 111/72 | HR 66 | Wt 160.0 lb

## 2022-05-10 DIAGNOSIS — A0472 Enterocolitis due to Clostridium difficile, not specified as recurrent: Secondary | ICD-10-CM

## 2022-05-10 NOTE — Progress Notes (Signed)
RFV: follow up for hospitalization for colitis  Patient ID: Tracy Hunt, female   DOB: 1945-10-23, 77 y.o.   MRN: [id number]  HPI 77yo F with history of colitis, pancreatic insufficiency, subsequently diagnosed with c.difficile infection, treated with 14 d course of fidaxomicin. She was discharged from the hospital on feb 2nd. She is doing better and steadily recovering. Getting her appetite better.  Outpatient Encounter Medications as of 05/10/2022  Medication Sig   acetaminophen (TYLENOL) 650 MG CR tablet Take 1,300 mg by mouth in the morning, at noon, and at bedtime. Takes 2 tablets in the morning, 2 in the afternoon, and 2 at bedtime.   Cholecalciferol (VITAMIN D) 50 MCG (2000 UT) tablet Take 2,000 Units by mouth daily.   dicyclomine (BENTYL) 10 MG capsule Take 1 capsule (10 mg total) by mouth 3 (three) times daily before meals.   estradiol (ESTRACE) 1 MG tablet Take 1 mg by mouth daily.   gabapentin (NEURONTIN) 100 MG capsule Take 1 tablet in the morning and bedtime along with '300mg'$  tablets. (Patient taking differently: Take 100 mg by mouth at bedtime.)   gabapentin (NEURONTIN) 300 MG capsule Take 1 capsule (300 mg total) by mouth 3 (three) times daily.   levothyroxine (SYNTHROID) 112 MCG tablet Take 112 mcg by mouth daily.   lipase/protease/amylase (CREON) 36000 UNITS CPEP capsule Take 2 capsules (72,000 Units total) by mouth 3 (three) times daily with meals.   Menthol, Topical Analgesic, (BIOFREEZE EX) Apply 1 drop topically daily as needed (back pain).   methocarbamol (ROBAXIN) 500 MG tablet Take 1 tablet (500 mg total) by mouth every 8 (eight) hours as needed for muscle spasms   Multiple Vitamins-Minerals (CENTRUM SILVER 50+WOMEN PO) Take 1 tablet by mouth daily.   Propylene Glycol (SYSTANE COMPLETE OP) Place 1 drop into both eyes 2 (two) times daily as needed (dry eyes).   rosuvastatin (CRESTOR) 10 MG tablet Take 10 mg by mouth at bedtime.   bismuth subsalicylate (PEPTO  BISMOL) 262 MG chewable tablet Chew 524 mg by mouth as needed for indigestion or diarrhea or loose stools. (Patient not taking: Reported on 05/10/2022)   brimonidine-timolol (COMBIGAN) 0.2-0.5 % ophthalmic solution Place 1 drop into both eyes every 12 (twelve) hours.   Cobalamin Combinations (B-12) 1000-400 MCG SUBL Place 1 tablet under the tongue daily. (Patient not taking: Reported on 05/10/2022)   loperamide (IMODIUM A-D) 2 MG tablet Take 2 mg by mouth 4 (four) times daily as needed for diarrhea or loose stools. (Patient not taking: Reported on 05/10/2022)   polyethylene glycol (MIRALAX) 17 g packet Take 17 g by mouth daily. (Patient not taking: Reported on 05/10/2022)   potassium chloride SA (KLOR-CON M) 20 MEQ tablet Take 1 tablet (20 mEq total) by mouth daily for 14 days.   No facility-administered encounter medications on file as of 05/10/2022.     Patient Active Problem List   Diagnosis Date Noted   Enteritis due to Clostridium difficile 04/22/2022   C. 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 12 point ros is otherwise negative Physical Exam   Wt 160 lb (72.6 kg)   BMI 28.34 kg/m   Physical Exam  Constitutional:  oriented to person, place, and time. appears well-developed and well-nourished. No distress.  HENT: [place]/AT, PERRLA, no scleral icterus Mouth/Throat: Oropharynx is clear and moist. No oropharyngeal exudate.  Cardiovascular: Normal rate, regular rhythm and normal heart sounds. Exam reveals no gallop and no friction rub.  No murmur heard.  Pulmonary/Chest: Effort normal and breath sounds normal. No respiratory distress.  has no wheezes.  Neck = supple, no nuchal rigidity Abdominal: Soft. Bowel sounds are normal.  exhibits no distension. There is no tenderness.  Lymphadenopathy: no cervical adenopathy. No axillary adenopathy Neurological: alert and oriented to person, place, and time.  Skin: Skin is warm and dry. No rash noted. No erythema.  Psychiatric: a normal mood and affect.  behavior is normal.   CBC Lab Results  Component Value Date   WBC 3.7 (L) 04/21/2022   RBC 3.60 (L) 04/21/2022   HGB 11.3 (L) 04/21/2022   HCT 34.9 (L) 04/21/2022   PLT 128 (L) 04/21/2022   MCV 96.9 04/21/2022   MCH 31.4 04/21/2022   MCHC 32.4 04/21/2022   RDW 14.0 04/21/2022   LYMPHSABS 1.1 04/19/2022   MONOABS 0.3 04/19/2022   EOSABS 0.1 04/19/2022    BMET Lab Results  Component Value Date   NA 138 04/22/2022   K 4.3 04/22/2022   CL 107 04/22/2022   CO2 25 04/22/2022   GLUCOSE 112 (H) 04/22/2022   BUN 16 04/22/2022   CREATININE 0.70 04/22/2022   CALCIUM 8.9 04/22/2022   GFRNONAA >60 04/22/2022      Assessment and Plan  Cdifficile colitis = doing well since being discharged and finishing out course of fidaxomicin on 2/10. She still is steadily gaining her stamina. Plan to watch to see if there is a rebound.   Has 4 extra tabs left over if needed

## 2022-05-12 DIAGNOSIS — A0472 Enterocolitis due to Clostridium difficile, not specified as recurrent: Secondary | ICD-10-CM | POA: Diagnosis not present

## 2022-05-12 DIAGNOSIS — K5732 Diverticulitis of large intestine without perforation or abscess without bleeding: Secondary | ICD-10-CM | POA: Diagnosis not present

## 2022-05-12 DIAGNOSIS — I700 Atherosclerosis of aorta: Secondary | ICD-10-CM | POA: Diagnosis not present

## 2022-05-12 DIAGNOSIS — G510 Bell's palsy: Secondary | ICD-10-CM | POA: Diagnosis not present

## 2022-05-12 DIAGNOSIS — D696 Thrombocytopenia, unspecified: Secondary | ICD-10-CM | POA: Diagnosis not present

## 2022-05-12 DIAGNOSIS — K589 Irritable bowel syndrome without diarrhea: Secondary | ICD-10-CM | POA: Diagnosis not present

## 2022-05-12 DIAGNOSIS — D509 Iron deficiency anemia, unspecified: Secondary | ICD-10-CM | POA: Diagnosis not present

## 2022-05-12 DIAGNOSIS — I10 Essential (primary) hypertension: Secondary | ICD-10-CM | POA: Diagnosis not present

## 2022-05-12 DIAGNOSIS — R197 Diarrhea, unspecified: Secondary | ICD-10-CM | POA: Diagnosis not present

## 2022-05-19 ENCOUNTER — Ambulatory Visit (INDEPENDENT_AMBULATORY_CARE_PROVIDER_SITE_OTHER): Payer: Medicare HMO | Admitting: Gastroenterology

## 2022-05-19 ENCOUNTER — Encounter: Payer: Self-pay | Admitting: Gastroenterology

## 2022-05-19 VITALS — BP 110/60 | HR 75 | Ht 63.0 in | Wt 157.0 lb

## 2022-05-19 DIAGNOSIS — K580 Irritable bowel syndrome with diarrhea: Secondary | ICD-10-CM

## 2022-05-19 DIAGNOSIS — D696 Thrombocytopenia, unspecified: Secondary | ICD-10-CM | POA: Diagnosis not present

## 2022-05-19 DIAGNOSIS — K8689 Other specified diseases of pancreas: Secondary | ICD-10-CM | POA: Diagnosis not present

## 2022-05-19 DIAGNOSIS — R197 Diarrhea, unspecified: Secondary | ICD-10-CM | POA: Diagnosis not present

## 2022-05-19 DIAGNOSIS — K589 Irritable bowel syndrome without diarrhea: Secondary | ICD-10-CM | POA: Diagnosis not present

## 2022-05-19 DIAGNOSIS — A0472 Enterocolitis due to Clostridium difficile, not specified as recurrent: Secondary | ICD-10-CM

## 2022-05-19 DIAGNOSIS — D509 Iron deficiency anemia, unspecified: Secondary | ICD-10-CM | POA: Diagnosis not present

## 2022-05-19 DIAGNOSIS — I10 Essential (primary) hypertension: Secondary | ICD-10-CM | POA: Diagnosis not present

## 2022-05-19 DIAGNOSIS — I700 Atherosclerosis of aorta: Secondary | ICD-10-CM | POA: Diagnosis not present

## 2022-05-19 DIAGNOSIS — K5732 Diverticulitis of large intestine without perforation or abscess without bleeding: Secondary | ICD-10-CM | POA: Diagnosis not present

## 2022-05-19 DIAGNOSIS — G510 Bell's palsy: Secondary | ICD-10-CM | POA: Diagnosis not present

## 2022-05-19 MED ORDER — DICYCLOMINE HCL 10 MG PO CAPS
10.0000 mg | ORAL_CAPSULE | Freq: Three times a day (TID) | ORAL | 11 refills | Status: DC
  Filled 2022-06-22: qty 90, 30d supply, fill #0

## 2022-05-19 NOTE — Patient Instructions (Signed)
We have sent the following medications to your pharmacy for you to pick up at your convenience: dicyclomine.   You can take over the counter Miralax daily as needed.   The  GI providers would like to encourage you to use [facility] to communicate with providers for non-urgent requests or questions.  Due to long hold times on the telephone, sending your provider a message by [facility] may be a faster and more efficient way to get a response.  Please allow 48 business hours for a response.  Please remember that this is for non-urgent requests.   Thank you for choosing me and [facility].  Pricilla Riffle. Dagoberto Ligas., MD., [facility]

## 2022-05-19 NOTE — Progress Notes (Signed)
    Assessment     C difficile colitis, resolved IBS Pancreatic insufficiency Diverticulitis, resolved   Recommendations    Continue Creon at current dosage. Trial of reducing to 36,000 units before smaller meals and if this is effective she may continue on this dose before smaller meals Trial of discontinuing dicyclomine before meals and if symptoms return resume dicyclomine before meals MiraLAX daily as needed for mild constipation if dietary adjustments are not effective REV in 1 year   HPI    This is a 77 year old female returning for follow-up of C. difficile colitis.  She is accompanied by her daughter, Tracy Hunt.  She has pancreatic insufficiency, irritable bowel syndrome, recent diverticulitis which was then complicated by C. difficile colitis.  She was hospitalized from January 26 through February 2.  She requested to switch physicians from Dr. Benson Norway to me during the hospitalization.  She was seen by Dr. Baxter Flattery of ID as well as GI.  She was initially treated with vancomycin however given ongoing fever she was then treated with a 10-day course of Dificid.  Since returning home she has had occasional mild constipation which has been corrected with dietary adjustments otherwise she has no gastrointestinal complaints at this time.  Her weight is stable and she feels well.   Labs / Imaging       Latest Ref Rng & Units 04/18/2022   12:23 PM 04/15/2022    7:26 PM 04/04/2022    2:46 PM  Hepatic Function  Total Protein 6.5 - 8.1 g/dL 5.7  7.1  6.8   Albumin 3.5 - 5.0 g/dL 2.1  3.1  2.7   AST 15 - 41 U/L 32  22  21   ALT 0 - 44 U/L 12  12  12   $ Alk Phosphatase 38 - 126 U/L 40  48  62   Total Bilirubin 0.3 - 1.2 mg/dL 0.7  0.8  0.8        Latest Ref Rng & Units 04/21/2022    4:32 AM 04/20/2022    4:58 AM 04/19/2022    4:33 PM  CBC  WBC 4.0 - 10.5 K/uL 3.7  5.4  4.7   Hemoglobin 12.0 - 15.0 g/dL 11.3  11.4  12.3   Hematocrit 36.0 - 46.0 % 34.9  34.5  37.4   Platelets 150 - 400 K/uL  128  125  104     Current Medications, Allergies, Past Medical History, Past Surgical History, Family History and Social History were reviewed in [employer] record.   Physical Exam: General: Well developed, well nourished, no acute distress Head: Normocephalic and atraumatic Eyes: Sclerae anicteric, EOMI Ears: Normal auditory acuity Mouth: No deformities or lesions noted Lungs: Clear throughout to auscultation Heart: Regular rate and rhythm; No murmurs, rubs or bruits Abdomen: Soft, non tender and non distended. No masses, hepatosplenomegaly or hernias noted. Normal Bowel sounds Rectal: Musculoskeletal: Symmetrical with no gross deformities  Pulses:  Normal pulses noted Extremities: No edema or deformities noted Neurological: Alert oriented x 4, grossly nonfocal Psychological:  Alert and cooperative. Normal mood and affect   Tracy Hunt Plan, MD 05/19/2022, 10:36 AM

## 2022-05-23 DIAGNOSIS — G510 Bell's palsy: Secondary | ICD-10-CM | POA: Diagnosis not present

## 2022-05-23 DIAGNOSIS — I10 Essential (primary) hypertension: Secondary | ICD-10-CM | POA: Diagnosis not present

## 2022-05-23 DIAGNOSIS — I700 Atherosclerosis of aorta: Secondary | ICD-10-CM | POA: Diagnosis not present

## 2022-05-23 DIAGNOSIS — D509 Iron deficiency anemia, unspecified: Secondary | ICD-10-CM | POA: Diagnosis not present

## 2022-05-23 DIAGNOSIS — K5732 Diverticulitis of large intestine without perforation or abscess without bleeding: Secondary | ICD-10-CM | POA: Diagnosis not present

## 2022-05-23 DIAGNOSIS — K589 Irritable bowel syndrome without diarrhea: Secondary | ICD-10-CM | POA: Diagnosis not present

## 2022-05-23 DIAGNOSIS — A0472 Enterocolitis due to Clostridium difficile, not specified as recurrent: Secondary | ICD-10-CM | POA: Diagnosis not present

## 2022-05-23 DIAGNOSIS — R197 Diarrhea, unspecified: Secondary | ICD-10-CM | POA: Diagnosis not present

## 2022-05-23 DIAGNOSIS — D696 Thrombocytopenia, unspecified: Secondary | ICD-10-CM | POA: Diagnosis not present

## 2022-05-24 ENCOUNTER — Ambulatory Visit
Admit: 2022-05-24 | Discharge: 2022-05-24 | Disposition: A | Discharge status: Home / Self Care | Payer: Medicare HMO | Attending: Internal Medicine | Admitting: Internal Medicine

## 2022-05-24 DIAGNOSIS — Z1231 Encounter for screening mammogram for malignant neoplasm of breast: Secondary | ICD-10-CM

## 2022-05-30 DIAGNOSIS — D692 Other nonthrombocytopenic purpura: Secondary | ICD-10-CM | POA: Diagnosis not present

## 2022-05-30 DIAGNOSIS — D225 Melanocytic nevi of trunk: Secondary | ICD-10-CM | POA: Diagnosis not present

## 2022-05-30 DIAGNOSIS — Z85828 Personal history of other malignant neoplasm of skin: Secondary | ICD-10-CM | POA: Diagnosis not present

## 2022-05-30 DIAGNOSIS — L821 Other seborrheic keratosis: Secondary | ICD-10-CM | POA: Diagnosis not present

## 2022-05-30 DIAGNOSIS — R208 Other disturbances of skin sensation: Secondary | ICD-10-CM | POA: Diagnosis not present

## 2022-05-30 DIAGNOSIS — L814 Other melanin hyperpigmentation: Secondary | ICD-10-CM | POA: Diagnosis not present

## 2022-05-30 DIAGNOSIS — D220 Melanocytic nevi of lip: Secondary | ICD-10-CM | POA: Diagnosis not present

## 2022-05-31 ENCOUNTER — Other Ambulatory Visit (HOSPITAL_COMMUNITY): Payer: Self-pay

## 2022-05-31 ENCOUNTER — Other Ambulatory Visit: Payer: Self-pay

## 2022-06-01 ENCOUNTER — Other Ambulatory Visit (HOSPITAL_COMMUNITY): Payer: Self-pay

## 2022-06-21 ENCOUNTER — Encounter: Payer: Self-pay | Admitting: Obstetrics and Gynecology

## 2022-06-21 ENCOUNTER — Ambulatory Visit (INDEPENDENT_AMBULATORY_CARE_PROVIDER_SITE_OTHER): Payer: Medicare HMO | Admitting: Obstetrics and Gynecology

## 2022-06-21 VITALS — BP 146/79 | HR 80

## 2022-06-21 DIAGNOSIS — N816 Rectocele: Secondary | ICD-10-CM

## 2022-06-21 DIAGNOSIS — N8110 Cystocele, unspecified: Secondary | ICD-10-CM

## 2022-06-21 DIAGNOSIS — N993 Prolapse of vaginal vault after hysterectomy: Secondary | ICD-10-CM | POA: Diagnosis not present

## 2022-06-21 DIAGNOSIS — N3281 Overactive bladder: Secondary | ICD-10-CM | POA: Diagnosis not present

## 2022-06-21 DIAGNOSIS — H401131 Primary open-angle glaucoma, bilateral, mild stage: Secondary | ICD-10-CM | POA: Diagnosis not present

## 2022-06-21 NOTE — Patient Instructions (Signed)
Plan for surgery: Exam under anesthesia, colpocleisis (closure of vagina), possible sling (if you have leakage), cystoscopy (to look in the bladder).

## 2022-06-21 NOTE — Progress Notes (Signed)
[facility] Return Visit  SUBJECTIVE  History of Present Illness: Tracy Hunt is a 77 y.o. female seen in follow-up for prolapse and constipation. She attempted to be fit for a pessary but was  not successful (cube, donut, gellhorn were all tried). She is interested in possible surgical management.   She has been feeling the prolapse more often, it is there all the time. Sometimes she has urinary leakage. Usually will leak on the way to the bathroom in the middle of the night. She wakes 2-3 times at night. Drinks 8-10 glasses water during the day, but stops drinking a few hours prior to bedtime. Denies leakage with cough or sneeze.   She was diagnosed with diverticulitis and c.diff and was admitted to the hospital. Was discharged 04/22/22 and symptoms are now improved. She has improved her diet and now has no constipation issues.   Past Medical History: Patient  has a past medical history of Acid reflux, Anemia, Arthritis, Bell's palsy (1988), C. difficile diarrhea, Cataract, Complication of anesthesia, Diverticulitis, Dry eye, Family history of adverse reaction to anesthesia, Glaucoma, Gout, Hepatitis (1986), Hiatal hernia, Hyperlipemia, Hypertension, Hypothyroidism, Malaria, Mumps, PONV (postoperative nausea and vomiting), Pre-diabetes, Rosacea, Scoliosis, and Sjogren's syndrome.   Past Surgical History: She  has a past surgical history that includes Abdominal hysterectomy (1995); Appendectomy (1995); Joint replacement (2012); Cholecystectomy; steroid injections; Eye surgery (Bilateral); Joint replacement; Reverse shoulder arthroplasty (Left, 10/23/2020); and Cataract extraction.   Medications: She has a current medication list which includes the following prescription(s): acetaminophen, brimonidine-timolol, vitamin d, dicyclomine, gabapentin, gabapentin, levothyroxine, lipase/protease/amylase, menthol (topical analgesic), multiple vitamins-minerals, propylene glycol, and  rosuvastatin.   Allergies: Patient is allergic to cosopt [dorzolamide hcl-timolol mal], other, and oyster shell.   Social History: Patient  reports that she has never smoked. She has never used smokeless tobacco. She reports that she does not currently use alcohol. She reports that she does not use drugs.      OBJECTIVE     Physical Exam: Vitals:   06/21/22 0943 06/21/22 0945  BP: (!) 144/86 (!) 146/79  Pulse: 73 80   Gen: No apparent distress, A&O x 3.   Detailed Urogynecologic Evaluation:  Normal external genitalia. Normal vaginal mucosa with atrophy.   POP-Q  1                                            Aa   1                                           Ba  1                                              C   5                                            Gh  5  Pb  5                                            tvl   1                                            Ap  1                                            Bp                                                 D      ASSESSMENT AND PLAN    [name] is a 77 y.o. with:  1. Prolapse of anterior vaginal wall   2. Prolapse of posterior vaginal wall   3. Vaginal vault prolapse after hysterectomy   4. Overactive bladder     -Recommended she undergo simple CMG at pre op visit to determine if she will have occult SUI - She does not wish to have medication for her bladder urgency/ urge incontinence symptoms. Not a candidate for anticholinergics due to sjogrens and dry eye.   Plan for surgery: Exam under anesthesia, colpocleisis, levator plication, perineorrhaphy, cystoscopy, possible midurethral sling.    - We reviewed the patient's specific anatomic and functional findings, with the assistance of diagrams, and together finalized the above procedure. The planned surgical procedures were discussed along with the surgical risks outlined below, which were also provided on a  detailed handout. Additional treatment options including expectant management, conservative management, medical management were discussed where appropriate.  We reviewed the benefits and risks of each treatment option.   General Surgical Risks: For all procedures, there are risks of bleeding, infection, damage to surrounding organs including but not limited to bowel, bladder, blood vessels, ureters and nerves, and need for further surgery if an injury were to occur. These risks are all low with minimally invasive surgery.   There are risks of numbness and weakness at any body site or buttock/rectal pain.  It is possible that baseline pain can be worsened by surgery, either with or without mesh. If surgery is vaginal, there is also a low risk of possible conversion to laparoscopy or open abdominal incision where indicated. Very rare risks include blood transfusion, blood clot, heart attack, pneumonia, or death.   There is also a risk of short-term postoperative urinary retention with need to use a catheter. About half of patients need to go home from surgery with a catheter, which is then later removed in the office. The risk of long-term need for a catheter is very low. There is also a risk of worsening of overactive bladder.   Sling: The effectiveness of a midurethral vaginal mesh sling is approximately 85%, and thus, there will be times when you may leak urine after surgery, especially if your bladder is full or if you have a strong cough. There is a balance between making the sling tight enough to treat your  leakage but not too tight so that you have long-term difficulty emptying your bladder. A mesh sling will not directly treat overactive bladder/urge incontinence and may worsen it.  There is an FDA safety notification on vaginal mesh procedures for prolapse but NOT mesh slings. We have extensive experience and training with mesh placement and we have close postoperative follow up to identify any  potential complications from mesh. It is important to realize that this mesh is a permanent implant that cannot be easily removed. There are rare risks of mesh exposure (2-4%), pain with intercourse (0-7%), and infection (<1%). The risk of mesh exposure if more likely in a woman with risks for poor healing (prior radiation, poorly controlled diabetes, or immunocompromised). The risk of new or worsened chronic pain after mesh implant is more common in women with baseline chronic pain and/or poorly controlled anxiety or depression. Approximately 2-4% of patients will experience longer-term post-operative voiding dysfunction that may require surgical revision of the sling. We also reviewed that postoperatively, her stream may not be as strong as before surgery.   Prolapse (with or without mesh): Risk factors for surgical failure  include things that put pressure on your pelvis and the surgical repair, including obesity, chronic cough, and heavy lifting or straining (including lifting children or adults, straining on the toilet, or lifting heavy objects such as furniture or anything weighing >25 lbs. Risks of recurrence is 20-30% with vaginal native tissue repair and a less than 10% with sacrocolpopexy with mesh.  Risk of recurrence with colpocleisis is >95%  - For preop Visit:  She is required to have a visit within 30 days of her surgery.    - Medical clearance: not required  - Anticoagulant use: No - Medicaid Hysterectomy form: n/a - Accepts blood transfusion: Yes - Expected length of stay: outpatient  Request sent for surgery scheduling.   Jaquita Folds, MD

## 2022-06-22 ENCOUNTER — Other Ambulatory Visit (HOSPITAL_COMMUNITY): Payer: Self-pay

## 2022-06-30 DIAGNOSIS — M1991 Primary osteoarthritis, unspecified site: Secondary | ICD-10-CM | POA: Diagnosis not present

## 2022-06-30 DIAGNOSIS — R251 Tremor, unspecified: Secondary | ICD-10-CM | POA: Diagnosis not present

## 2022-06-30 DIAGNOSIS — G5793 Unspecified mononeuropathy of bilateral lower limbs: Secondary | ICD-10-CM | POA: Diagnosis not present

## 2022-06-30 DIAGNOSIS — E663 Overweight: Secondary | ICD-10-CM | POA: Diagnosis not present

## 2022-06-30 DIAGNOSIS — M79645 Pain in left finger(s): Secondary | ICD-10-CM | POA: Diagnosis not present

## 2022-06-30 DIAGNOSIS — M1A09X0 Idiopathic chronic gout, multiple sites, without tophus (tophi): Secondary | ICD-10-CM | POA: Diagnosis not present

## 2022-06-30 DIAGNOSIS — M359 Systemic involvement of connective tissue, unspecified: Secondary | ICD-10-CM | POA: Diagnosis not present

## 2022-06-30 DIAGNOSIS — Z6827 Body mass index (BMI) 27.0-27.9, adult: Secondary | ICD-10-CM | POA: Diagnosis not present

## 2022-07-08 ENCOUNTER — Other Ambulatory Visit: Payer: Self-pay | Admitting: Gastroenterology

## 2022-07-08 ENCOUNTER — Telehealth: Payer: Self-pay | Admitting: Gastroenterology

## 2022-07-08 ENCOUNTER — Other Ambulatory Visit (HOSPITAL_COMMUNITY): Payer: Self-pay

## 2022-07-08 MED ORDER — PANCRELIPASE (LIP-PROT-AMYL) 36000-114000 UNITS PO CPEP
72000.0000 [IU] | ORAL_CAPSULE | Freq: Three times a day (TID) | ORAL | 1 refills | Status: AC
  Filled 2022-07-08: qty 200, 34d supply, fill #0
  Filled 2022-07-08: qty 28, 5d supply, fill #0
  Filled 2022-07-08: qty 200, 34d supply, fill #0

## 2022-07-08 NOTE — Telephone Encounter (Signed)
The pt states that she is out of Creon and not sure what to do since it takes 5-7 days to be mailed.  The prescription was refilled today and has not been mailed as of yet. I have advised that she call the pharmacy and let them know she will pick it up this time so she will have it on hand

## 2022-07-08 NOTE — Telephone Encounter (Signed)
PT is calling about Creon. It take 5-7 days to have it mailed to her and she wants to know what she can do until it comes because she is out of the medication. Please advise.

## 2022-07-18 ENCOUNTER — Other Ambulatory Visit (HOSPITAL_COMMUNITY): Payer: Self-pay

## 2022-07-18 ENCOUNTER — Other Ambulatory Visit: Payer: Self-pay | Admitting: Gastroenterology

## 2022-07-18 MED ORDER — DICYCLOMINE HCL 10 MG PO CAPS
10.0000 mg | ORAL_CAPSULE | Freq: Three times a day (TID) | ORAL | 2 refills | Status: DC
  Filled 2022-07-18: qty 270, 90d supply, fill #0
  Filled 2022-10-20: qty 270, 90d supply, fill #1
  Filled 2023-02-01: qty 270, 90d supply, fill #2

## 2022-07-25 ENCOUNTER — Ambulatory Visit (INDEPENDENT_AMBULATORY_CARE_PROVIDER_SITE_OTHER): Payer: Medicare HMO | Admitting: Neurology

## 2022-07-25 ENCOUNTER — Encounter: Payer: Self-pay | Admitting: Neurology

## 2022-07-25 VITALS — BP 142/86 | HR 84 | Ht 63.0 in | Wt 159.0 lb

## 2022-07-25 DIAGNOSIS — G609 Hereditary and idiopathic neuropathy, unspecified: Secondary | ICD-10-CM

## 2022-07-25 DIAGNOSIS — R202 Paresthesia of skin: Secondary | ICD-10-CM

## 2022-07-25 NOTE — Patient Instructions (Addendum)
For your itching sensation over the back, you can apply lidocaine patch as needed  Continue gabapentin 300mg  in the morning, 300mg  in the afternoon, and 400mg  at bedtime.  Continue balance exercises  Return to clinic 9 months

## 2022-07-25 NOTE — Progress Notes (Signed)
Follow-up Visit   Date: 01/21/2022    Tracy Hunt MRN: [id number] DOB: [birth year]    Tracy Hunt is a 77 y.o. right-handed Caucasian female with hypertension, hypothyroidism, Sjogren's syndrome (followed by Dr. Dierdre Forth), and right Bell's palsy x 2 with residual weaknes returning to the clinic for follow-up of neuropathy.  The patient was accompanied to the clinic by self.  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 Stable. Risk factors include:  Sjogren's disease, age.    - Continue gabapentin 300mg  three times daily + 100mg  at bedtime  - She is compliant with using a cane on uneven ground or for long distances  - Continue home balance exercises  2.  Notalgia paresthetica  - Start OTC lidocaine patch  Return to clinic in 9 months  --------------------------------------------- History of present illness: For the past 2-3 years, she has achy, burning pain, and numbness involving the feet.  Symptoms are constant, without any identifiable exacerbating or alleviating factors.  She endorses imbalance, and uses a cane only as needed on uneven ground.  No falls. She has chronic low back pain. She takes gabapentin 300mg  three times daily which helps reduce her pain, but does not alleviate it. Recent B12 levels and HbA1c were normal.  She lives with daughter in 4 level home.  Nonsmoker, does not drink alcohol.   UPDATE 01/21/2022:  She is here for follow-up visit.  She continues to have tingling and burning in the feet.  It has improved at night time after increasing her bedtime dose to 400mg .  She tried gabapentin 600mg , but felt it made her too groggy.    She had one fall after tripping on something hanging off the bed and required assistance to stand up. She has started using a cane when she is out and especially on uneven ground. She is working with a Systems analyst since September.  UPDATE 07/25/2022:  She  is here for follow-up visit.  Neuropathy has been stable.  She is taking gabapentin 300mg  three times daily + 100mg  at bedtime, which mostly controls her pain.  She is going to the gym once per week and works with a Psychologist, educational for balance.  No interval falls.  She was hospitalized in January with C difficile colitis and gout.  She completed home PT following this.   Today, she also reports having itching in the mid-back area which is very localized.    Medications:  Current Outpatient Medications on File Prior to Visit  Medication Sig Dispense Refill   acetaminophen (TYLENOL) 650 MG CR tablet Take 1,300 mg by mouth in the morning, at noon, and at bedtime. Takes 2 tablets in the morning, 2 in the afternoon, and 2 at bedtime.     brimonidine (ALPHAGAN) 0.15 % ophthalmic solution 1 drop 3 (three) times daily.     Cholecalciferol (VITAMIN D) 50 MCG (2000 UT) tablet Take 2,000 Units by mouth daily.     Cobalamin Combinations (B-12) 1000-400 MCG SUBL Place under the tongue.     cycloSPORINE (RESTASIS) 0.05 % ophthalmic emulsion 1 drop 2 (two) times daily.     dorzolamide-timolol (COSOPT) 22.3-6.8 MG/ML ophthalmic solution Place 1 drop into both eyes in the morning and at bedtime.     estradiol (ESTRACE) 1 MG tablet Take 1 mg by mouth daily.     levothyroxine (SYNTHROID) 125 MCG tablet Take 125 mcg by mouth daily.     Multiple Vitamins-Minerals (CENTRUM SILVER 50+WOMEN PO) Take 1  tablet by mouth daily.     omeprazole (PRILOSEC) 20 MG capsule Take 20 mg by mouth daily.     Propylene Glycol (SYSTANE COMPLETE OP) Apply to eye.     psyllium (METAMUCIL) 58.6 % packet Take 1 packet by mouth daily.     rosuvastatin (CRESTOR) 10 MG tablet Take 10 mg by mouth at bedtime.     triamterene-hydrochlorothiazide (MAXZIDE-25) 37.5-25 MG tablet Take 1 tablet by mouth daily.     bismuth subsalicylate (PEPTO BISMOL) 262 MG chewable tablet Chew 524 mg by mouth as needed for indigestion or diarrhea or loose stools. (Patient  not taking: Reported on 06/11/2021)     Carboxymethylcellulose Sodium 1 % GEL Place 1 drop into both eyes at bedtime. (Patient not taking: Reported on 06/11/2021)     hydroxypropyl methylcellulose / hypromellose (ISOPTO TEARS / GONIOVISC) 2.5 % ophthalmic solution Place 1 drop into both eyes daily. (Patient not taking: Reported on 06/11/2021)     Menthol, Topical Analgesic, (BIOFREEZE EX) Apply 1 drop topically daily as needed (back pain). (Patient not taking: Reported on 06/11/2021)     simethicone (MYLICON) 125 MG chewable tablet Chew 125 mg by mouth every 6 (six) hours as needed for flatulence. (Patient not taking: Reported on 06/11/2021)     traMADol (ULTRAM) 50 MG tablet Take 1 tablet (50 mg total) by mouth every 6 (six) hours as needed for moderate pain or severe pain. (Patient not taking: Reported on 06/11/2021) 30 tablet 0   No current facility-administered medications on file prior to visit.    Allergies:  Allergies  Allergen Reactions   Other Itching    Adhesive tape    Oyster Shell Hives   Shellfish Allergy Nausea And Vomiting    Oysters    Vital Signs:  BP 133/81   Pulse 79   Ht 5\' 3"  (1.6 m)   Wt 177 lb (80.3 kg)   SpO2 97%   BMI 31.35 kg/m   Neurological Exam: MENTAL STATUS including orientation to time, place, person, recent and remote memory, attention span and concentration, language, and fund of knowledge is normal.  Speech is not dysarthric.  CRANIAL NERVES:   Pupils equal round and reactive to light.  Normal conjugate, extra-ocular eye movements in all directions of gaze.  No ptosis.  Face is symmetric.   MOTOR:  Motor strength is 5/5 in all extremities.  No atrophy, fasciculations or abnormal movements.  No pronator drift.  Tone is normal.    MSRs:  Reflexes are 2+/4 throughout, except 1+/4 at the ankles bilaterally.  SENSORY:   Vibration is reduced a the feet.   COORDINATION/GAIT:  Gait is mildly-wide based, stable unassisted.  Data:n/a   Thank you for  allowing me to participate in patient's care.  If I can answer any additional questions, I would be pleased to do so.    Sincerely,    Cameran Ahmed K. Allena Katz, DO

## 2022-07-27 DIAGNOSIS — E785 Hyperlipidemia, unspecified: Secondary | ICD-10-CM | POA: Diagnosis not present

## 2022-07-27 DIAGNOSIS — K439 Ventral hernia without obstruction or gangrene: Secondary | ICD-10-CM | POA: Diagnosis not present

## 2022-07-27 DIAGNOSIS — R600 Localized edema: Secondary | ICD-10-CM | POA: Diagnosis not present

## 2022-07-27 DIAGNOSIS — K8689 Other specified diseases of pancreas: Secondary | ICD-10-CM | POA: Diagnosis not present

## 2022-07-27 DIAGNOSIS — M5416 Radiculopathy, lumbar region: Secondary | ICD-10-CM | POA: Diagnosis not present

## 2022-07-27 DIAGNOSIS — E039 Hypothyroidism, unspecified: Secondary | ICD-10-CM | POA: Diagnosis not present

## 2022-07-27 DIAGNOSIS — E1140 Type 2 diabetes mellitus with diabetic neuropathy, unspecified: Secondary | ICD-10-CM | POA: Diagnosis not present

## 2022-07-27 DIAGNOSIS — M3505 Sjogren syndrome with inflammatory arthritis: Secondary | ICD-10-CM | POA: Diagnosis not present

## 2022-09-20 DIAGNOSIS — H04123 Dry eye syndrome of bilateral lacrimal glands: Secondary | ICD-10-CM | POA: Diagnosis not present

## 2022-09-20 DIAGNOSIS — H401132 Primary open-angle glaucoma, bilateral, moderate stage: Secondary | ICD-10-CM | POA: Diagnosis not present

## 2022-09-26 DIAGNOSIS — E785 Hyperlipidemia, unspecified: Secondary | ICD-10-CM | POA: Diagnosis not present

## 2022-09-26 DIAGNOSIS — E039 Hypothyroidism, unspecified: Secondary | ICD-10-CM | POA: Diagnosis not present

## 2022-10-03 DIAGNOSIS — M1811 Unilateral primary osteoarthritis of first carpometacarpal joint, right hand: Secondary | ICD-10-CM | POA: Diagnosis not present

## 2022-10-03 DIAGNOSIS — M180 Bilateral primary osteoarthritis of first carpometacarpal joints: Secondary | ICD-10-CM | POA: Diagnosis not present

## 2022-10-03 DIAGNOSIS — M1812 Unilateral primary osteoarthritis of first carpometacarpal joint, left hand: Secondary | ICD-10-CM | POA: Diagnosis not present

## 2022-10-23 ENCOUNTER — Other Ambulatory Visit: Payer: Self-pay

## 2022-10-23 ENCOUNTER — Emergency Department (HOSPITAL_BASED_OUTPATIENT_CLINIC_OR_DEPARTMENT_OTHER): Payer: Medicare HMO

## 2022-10-23 ENCOUNTER — Emergency Department (HOSPITAL_BASED_OUTPATIENT_CLINIC_OR_DEPARTMENT_OTHER)
Admission: EM | Admit: 2022-10-23 | Discharge: 2022-10-23 | Disposition: A | Discharge status: Home / Self Care | Payer: Medicare HMO | Attending: Emergency Medicine | Admitting: Emergency Medicine

## 2022-10-23 ENCOUNTER — Encounter (HOSPITAL_BASED_OUTPATIENT_CLINIC_OR_DEPARTMENT_OTHER): Payer: Self-pay | Admitting: Emergency Medicine

## 2022-10-23 DIAGNOSIS — Z79899 Other long term (current) drug therapy: Secondary | ICD-10-CM | POA: Diagnosis not present

## 2022-10-23 DIAGNOSIS — R519 Headache, unspecified: Secondary | ICD-10-CM

## 2022-10-23 DIAGNOSIS — I10 Essential (primary) hypertension: Secondary | ICD-10-CM | POA: Diagnosis not present

## 2022-10-23 DIAGNOSIS — I6782 Cerebral ischemia: Secondary | ICD-10-CM | POA: Diagnosis not present

## 2022-10-23 DIAGNOSIS — E039 Hypothyroidism, unspecified: Secondary | ICD-10-CM | POA: Diagnosis not present

## 2022-10-23 LAB — BASIC METABOLIC PANEL
Anion gap: 8 (ref 5–15)
BUN: 10 mg/dL (ref 8–23)
CO2: 29 mmol/L (ref 22–32)
Calcium: 10 mg/dL (ref 8.9–10.3)
Chloride: 104 mmol/L (ref 98–111)
Creatinine, Ser: 0.59 mg/dL (ref 0.44–1.00)
GFR, Estimated: >60 mL/min (ref >60)
Glucose, Bld: 125 mg/dL — ABNORMAL HIGH (ref 70–99)
Potassium: 3.8 mmol/L (ref 3.5–5.1)
Sodium: 141 mmol/L (ref 135–145)

## 2022-10-23 LAB — CBC WITH DIFFERENTIAL/PLATELET
Abs Immature Granulocytes: 0 10*3/uL (ref 0.00–0.07)
Basophils Absolute: 0 10*3/uL (ref 0.0–0.1)
Basophils Relative: 0 %
Eosinophils Absolute: 0.1 10*3/uL (ref 0.0–0.5)
Eosinophils Relative: 3 %
HCT: 41.7 % (ref 36.0–46.0)
Hemoglobin: 14.2 g/dL (ref 12.0–15.0)
Immature Granulocytes: 0 %
Lymphocytes Relative: 45 %
Lymphs Abs: 1.4 10*3/uL (ref 0.7–4.0)
MCH: 32.2 pg (ref 26.0–34.0)
MCHC: 34.1 g/dL (ref 30.0–36.0)
MCV: 94.6 fL (ref 80.0–100.0)
Monocytes Absolute: 0.3 10*3/uL (ref 0.1–1.0)
Monocytes Relative: 9 %
Neutro Abs: 1.3 10*3/uL — ABNORMAL LOW (ref 1.7–7.7)
Neutrophils Relative %: 43 %
Platelets: 138 10*3/uL — ABNORMAL LOW (ref 150–400)
RBC: 4.41 MIL/uL (ref 3.87–5.11)
RDW: 13.2 % (ref 11.5–15.5)
WBC: 3 10*3/uL — ABNORMAL LOW (ref 4.0–10.5)
nRBC: 0 % (ref 0.0–0.2)

## 2022-10-23 MED ORDER — SODIUM CHLORIDE 0.9 % IV SOLN: INTRAVENOUS | Status: DC

## 2022-10-23 MED ORDER — IOHEXOL 300 MG/ML  SOLN
100.0000 mL | Freq: Once | INTRAMUSCULAR | Status: AC | PRN
  Administered 2022-10-23: 100 mL via INTRAVENOUS

## 2022-10-23 MED ORDER — HYDROMORPHONE HCL 1 MG/ML IJ SOLN
0.5000 mg | Freq: Once | INTRAMUSCULAR | Status: DC
  Filled 2022-10-23: qty 1

## 2022-10-23 MED ORDER — TRIAMTERENE-HCTZ 37.5-25 MG PO TABS: 1.0000 | ORAL_TABLET | Freq: Every day | ORAL | 1 refills | Status: DC

## 2022-10-23 MED ORDER — ONDANSETRON HCL 4 MG/2ML IJ SOLN
4.0000 mg | Freq: Once | INTRAMUSCULAR | Status: DC
  Filled 2022-10-23: qty 2

## 2022-10-23 NOTE — Discharge Instructions (Signed)
Restart your Maxide.  New prescription provided.  Check your blood pressure daily and just marked the date.  Make an appointment follow-up with your primary care doctor.  May require some adjustments on your blood pressure medicine.  Return for worse or more severe headache chest pain trouble breathing or any strokelike symptoms.  CT angio today without any acute findings no evidence of any head bleed.  Labs today normal liver function tests were not checked here today.

## 2022-10-23 NOTE — ED Provider Notes (Addendum)
[name] Provider Note   CSN: [id number] Arrival date & time: 10/23/22  1009     History  Chief Complaint  Patient presents with   Hypertension   Headache    Tracy Hunt is a 77 y.o. female.  Patient here with acute onset about 5 in the morning of posterior headache now more frontal headache.  Not associated with nausea or vomiting.  Is associated with blood pressure being very high.  Patient had a long history of high blood pressure but was taken off blood pressure medicine for being too low in the first part of the year.  Pressure here is 179/90.  No fevers.  Temp 98.1.  No visual changes no speech problems no numbness or weakness to upper extremities lower extremities.  No neck stiffness.  No upper respiratory symptoms.  Past medical history significant for hypertension Sojourn syndrome hypothyroidism hyperlipidemia.  Past surgical history significant for gallbladder removal abdominal hysterectomy.  Patient is never used tobacco products.       Home Medications Prior to Admission medications   Medication Sig Start Date End Date Taking? Authorizing Provider  triamterene-hydrochlorothiazide (MAXZIDE-25) 37.5-25 MG tablet Take 1 tablet by mouth daily. 10/23/22  Yes Vanetta Mulders, MD  acetaminophen (TYLENOL) 650 MG CR tablet Take 1,300 mg by mouth in the morning, at noon, and at bedtime. Takes 2 tablets in the morning, 2 in the afternoon, and 2 at bedtime. 03/04/19   [provider]  brimonidine-timolol (COMBIGAN) 0.2-0.5 % ophthalmic solution Place 1 drop into both eyes every 12 (twelve) hours.    [provider]  Cholecalciferol (VITAMIN D) 50 MCG (2000 UT) tablet Take 2,000 Units by mouth daily.    [provider]  dicyclomine (BENTYL) 10 MG capsule Take 1 capsule (10 mg total) by mouth 3 (three) times daily before meals. 07/18/22   Meryl Dare, MD  gabapentin (NEURONTIN) 100 MG capsule Take 1  tablet in the morning and bedtime along with 300mg  tablets. Patient taking differently: Take 100 mg by mouth at bedtime. 01/21/22   Patel, Roxana Hires K, DO  gabapentin (NEURONTIN) 300 MG capsule Take 1 capsule (300 mg total) by mouth 3 (three) times daily. 01/21/22   Nita Sickle K, DO  levothyroxine (SYNTHROID) 112 MCG tablet Take 112 mcg by mouth daily before breakfast.    [provider]  lipase/protease/amylase (CREON) 36000 UNITS CPEP capsule Take 2 capsules (72,000 Units total) by mouth 3 (three) times daily with meals. 07/08/22   Meryl Dare, MD  Menthol, Topical Analgesic, (BIOFREEZE EX) Apply 1 drop topically daily as needed (back pain).    [provider]  Multiple Vitamins-Minerals (CENTRUM SILVER 50+WOMEN PO) Take 1 tablet by mouth daily.    [provider]  Propylene Glycol (SYSTANE COMPLETE OP) Place 1 drop into both eyes 2 (two) times daily as needed (dry eyes).    [provider]  rosuvastatin (CRESTOR) 10 MG tablet Take 10 mg by mouth at bedtime. 04/15/20   [provider]      Allergies    Cosopt [dorzolamide hcl-timolol mal], Other, and Oyster shell    Review of Systems   Review of Systems  Constitutional:  Negative for chills and fever.  HENT:  Negative for ear pain and sore throat.   Eyes:  Negative for pain and visual disturbance.  Respiratory:  Negative for cough and shortness of breath.   Cardiovascular:  Negative for chest pain and palpitations.  Gastrointestinal:  Negative for abdominal pain and vomiting.  Genitourinary:  Negative for dysuria and hematuria.  Musculoskeletal:  Positive for arthralgias. Negative for back pain, neck pain and neck stiffness.  Skin:  Negative for color change and rash.  Neurological:  Positive for headaches. Negative for seizures and syncope.  All other systems reviewed and are negative.   Physical Exam Updated Vital Signs BP (!) 177/100   Pulse (!) 58   Temp 98.1 F (36.7 C)   Resp 17    SpO2 100%  Physical Exam Vitals and nursing note reviewed.  Constitutional:      General: She is not in acute distress.    Appearance: Normal appearance. She is well-developed. She is not ill-appearing.  HENT:     Head: Normocephalic and atraumatic.     Mouth/Throat:     Mouth: Mucous membranes are moist.  Eyes:     Extraocular Movements: Extraocular movements intact.     Conjunctiva/sclera: Conjunctivae normal.     Pupils: Pupils are equal, round, and reactive to light.  Cardiovascular:     Rate and Rhythm: Normal rate and regular rhythm.     Heart sounds: No murmur heard. Pulmonary:     Effort: Pulmonary effort is normal. No respiratory distress.     Breath sounds: Normal breath sounds.  Abdominal:     Palpations: Abdomen is soft.     Tenderness: There is no abdominal tenderness.  Musculoskeletal:        General: No swelling.     Cervical back: Normal range of motion. Rigidity present. No tenderness.     Right lower leg: No edema.     Left lower leg: No edema.  Skin:    General: Skin is warm and dry.     Capillary Refill: Capillary refill takes less than 2 seconds.  Neurological:     General: No focal deficit present.     Mental Status: She is alert and oriented to person, place, and time.     Cranial Nerves: No cranial nerve deficit.     Sensory: No sensory deficit.     Motor: No weakness.  Psychiatric:        Mood and Affect: Mood normal.     ED Results / Procedures / Treatments   Labs (all labs ordered are listed, but only abnormal results are displayed) Labs Reviewed  CBC WITH DIFFERENTIAL/PLATELET - Abnormal; Notable for the following components:      Result Value   WBC 3.0 (*)    Platelets 138 (*)    Neutro Abs 1.3 (*)    All other components within normal limits  BASIC METABOLIC PANEL - Abnormal; Notable for the following components:   Glucose, Bld 125 (*)    All other components within normal limits    EKG EKG Interpretation Date/Time:  Sunday  October 23 2022 10:33:50 EDT Ventricular Rate:  67 PR Interval:  178 QRS Duration:  86 QT Interval:  408 QTC Calculation: 431 R Axis:   -49  Text Interpretation: Normal sinus rhythm Possible Left atrial enlargement Left axis deviation Anterolateral infarct , age undetermined Abnormal ECG When compared with ECG of 15-Apr-2022 19:53, PREVIOUS ECG IS PRESENT No significant change since last tracing Confirmed by Vanetta Mulders [phone]) on 10/23/2022 10:43:02 AM  Radiology CT Angio Head W or Wo Contrast  Result Date: 10/23/2022 CLINICAL DATA:  Headache, sudden, severe.  History of hypertension. EXAM: CT ANGIOGRAPHY HEAD TECHNIQUE: Multidetector CT imaging of the head was performed using the standard protocol during  bolus administration of intravenous contrast. Multiplanar CT image reconstructions and MIPs were obtained to evaluate the vascular anatomy. RADIATION DOSE REDUCTION: This exam was performed according to the departmental dose-optimization program which includes automated exposure control, adjustment of the mA and/or kV according to patient size and/or use of iterative reconstruction technique. CONTRAST:  OMNIPAQUE IOHEXOL 300 MG/ML  SOLN COMPARISON:  None Available. FINDINGS: CT HEAD Brain: No sign of acute infarction or hemorrhage. Mild chronic small-vessel ischemic change of the hemispheric white matter. No large vessel stroke. No mass, hydrocephalus or extra-axial collection. Vascular: There is atherosclerotic calcification of the major vessels at the base of the brain. Skull: Negative Sinuses: Clear/normal Other: None CTA HEAD Anterior circulation: Both internal carotid arteries are widely patent through the skull base and siphon regions. Ordinary siphon atherosclerotic calcification but no stenosis greater than 30%. The anterior and middle cerebral vessels are patent. No large vessel occlusion or proximal stenosis. No aneurysm or vascular malformation. Posterior circulation: Both vertebral  arteries are patent through the foramen magnum to the basilar artery. No basilar stenosis. Posterior circulation branch vessels are patent. Venous sinuses: Patent and normal. Anatomic variants: None Review of the MIP images confirms the above findings. IMPRESSION: 1. No acute head CT finding. Mild chronic small-vessel ischemic change of the hemispheric white matter. 2. No intracranial large vessel occlusion or proximal stenosis. No aneurysm or vascular malformation. Electronically Signed   By: Paulina Fusi M.D.   On: 10/23/2022 12:26    Procedures Procedures    Medications Ordered in ED Medications  0.9 %  sodium chloride infusion ( Intravenous Restarted 10/23/22 1217)  ondansetron (ZOFRAN) injection 4 mg (0 mg Intravenous Hold 10/23/22 1127)  HYDROmorphone (DILAUDID) injection 0.5 mg (0 mg Intravenous Hold 10/23/22 1127)  iohexol (OMNIPAQUE) 300 MG/ML solution 100 mL (100 mLs Intravenous Contrast Given 10/23/22 1206)    ED Course/ Medical Decision Making/ A&P                                 Medical Decision Making Amount and/or Complexity of Data Reviewed Labs: ordered. Radiology: ordered.  Risk Prescription drug management.   Patient with acute onset of posterior headache now more frontal.  Elevated blood pressure.  Based on this we will get CT a head and neck.  The onset of this was at 5 in the morning.  Patient nontoxic no acute distress.  Blood pressure is elevated here.  Chart review shows the patient was on Maxide-25.  Patient states headache is almost resolved.  Does not remind her of any of her previous migraines.  CT angio head with and without had no acute findings no evidence of any subarachnoid hemorrhage.  Patient's blood pressure still elevated.  Patient does not check her blood pressure daily so were not sure how long this has been up.  But based on this we will start her back on her Maxide.  Have her follow-up with her primary care doctor precautions provided.  Patient  currently nontoxic no acute distress.  No concerns for hypertensive emergency.   Final Clinical Impression(s) / ED Diagnoses Final diagnoses:  Primary hypertension  Acute nonintractable headache, unspecified headache type    Rx / DC Orders ED Discharge Orders          Ordered    triamterene-hydrochlorothiazide (MAXZIDE-25) 37.5-25 MG tablet  Daily        10/23/22 1321  Vanetta Mulders, MD 10/23/22 1104    Vanetta Mulders, MD 10/23/22 1322

## 2022-10-23 NOTE — ED Triage Notes (Addendum)
Pt presents to ED POV. Pt c/o HTN and HA since 5am. Pt reports that she was taken off bp meds in feb d/t bp being low. Bp has been stable until this morning. Pt denies and other neuro s/s

## 2022-10-26 DIAGNOSIS — E876 Hypokalemia: Secondary | ICD-10-CM | POA: Diagnosis not present

## 2022-10-26 DIAGNOSIS — E1140 Type 2 diabetes mellitus with diabetic neuropathy, unspecified: Secondary | ICD-10-CM | POA: Diagnosis not present

## 2022-10-26 DIAGNOSIS — I10 Essential (primary) hypertension: Secondary | ICD-10-CM | POA: Diagnosis not present

## 2022-10-26 DIAGNOSIS — R351 Nocturia: Secondary | ICD-10-CM | POA: Diagnosis not present

## 2022-10-26 DIAGNOSIS — E039 Hypothyroidism, unspecified: Secondary | ICD-10-CM | POA: Diagnosis not present

## 2022-10-26 DIAGNOSIS — M3505 Sjogren syndrome with inflammatory arthritis: Secondary | ICD-10-CM | POA: Diagnosis not present

## 2022-10-26 DIAGNOSIS — I700 Atherosclerosis of aorta: Secondary | ICD-10-CM | POA: Diagnosis not present

## 2022-10-26 DIAGNOSIS — R531 Weakness: Secondary | ICD-10-CM | POA: Diagnosis not present

## 2022-10-28 ENCOUNTER — Encounter: Payer: Self-pay | Admitting: Obstetrics and Gynecology

## 2022-10-31 ENCOUNTER — Telehealth: Payer: Self-pay

## 2022-10-31 NOTE — Telephone Encounter (Signed)
Transition Care Management Follow-up Telephone Call Date of discharge and from where: 10/23/2022 [facility] How have you been since you were released from the hospital? Patient stated she is still struggling with getting her medication dosage correct. Any questions or concerns? No  Items Reviewed: Did the pt receive and understand the discharge instructions provided? Yes  Medications obtained and verified?  Patient stated she did not fill the prescription because it was changed to another medication.She is able to obtain her medications. Other? No  Any new allergies since your discharge? No  Dietary orders reviewed? Yes Do you have support at home? Yes   Follow up appointments reviewed:  PCP Hospital f/u appt confirmed? Yes  Scheduled to see Chilton Greathouse, MD on 10/28/2022 @ [facility] Specialist Hospital f/u appt confirmed? No  Scheduled to see  on  @ . Are transportation arrangements needed? No  If their condition worsens, is the pt aware to call PCP or go to the Emergency Dept.? Yes Was the patient provided with contact information for the PCP's office or ED? Yes Was to pt encouraged to call back with questions or concerns? Yes   [facility]  [facility] Population Health Community Resource Care Guide   [email or web address]  ?? [phone]   Website: [email or web address]  [email or web address]

## 2022-11-08 DIAGNOSIS — E785 Hyperlipidemia, unspecified: Secondary | ICD-10-CM | POA: Diagnosis not present

## 2022-11-08 DIAGNOSIS — E1140 Type 2 diabetes mellitus with diabetic neuropathy, unspecified: Secondary | ICD-10-CM | POA: Diagnosis not present

## 2022-11-08 DIAGNOSIS — R600 Localized edema: Secondary | ICD-10-CM | POA: Diagnosis not present

## 2022-11-08 DIAGNOSIS — I10 Essential (primary) hypertension: Secondary | ICD-10-CM | POA: Diagnosis not present

## 2022-11-09 ENCOUNTER — Other Ambulatory Visit (HOSPITAL_COMMUNITY): Payer: Self-pay

## 2022-11-09 ENCOUNTER — Ambulatory Visit (INDEPENDENT_AMBULATORY_CARE_PROVIDER_SITE_OTHER): Payer: Medicare HMO | Admitting: Obstetrics and Gynecology

## 2022-11-09 VITALS — BP 128/73 | HR 59

## 2022-11-09 DIAGNOSIS — Z01818 Encounter for other preprocedural examination: Secondary | ICD-10-CM

## 2022-11-09 DIAGNOSIS — R350 Frequency of micturition: Secondary | ICD-10-CM

## 2022-11-09 DIAGNOSIS — N816 Rectocele: Secondary | ICD-10-CM

## 2022-11-09 DIAGNOSIS — N993 Prolapse of vaginal vault after hysterectomy: Secondary | ICD-10-CM

## 2022-11-09 DIAGNOSIS — N3281 Overactive bladder: Secondary | ICD-10-CM

## 2022-11-09 DIAGNOSIS — N8110 Cystocele, unspecified: Secondary | ICD-10-CM

## 2022-11-09 MED ORDER — OXYCODONE HCL 5 MG PO TABS
5.0000 mg | ORAL_TABLET | ORAL | 0 refills | Status: DC | PRN
Start: 2022-11-09 — End: 2023-01-10
  Filled 2022-11-09: qty 15, 3d supply, fill #0

## 2022-11-09 MED ORDER — ACETAMINOPHEN 500 MG PO TABS
500.0000 mg | ORAL_TABLET | Freq: Four times a day (QID) | ORAL | 0 refills | Status: DC | PRN
Start: 2022-11-09 — End: 2022-12-23
  Filled 2022-11-09: qty 30, 8d supply, fill #0

## 2022-11-09 MED ORDER — IBUPROFEN 600 MG PO TABS
600.0000 mg | ORAL_TABLET | Freq: Four times a day (QID) | ORAL | 0 refills | Status: DC | PRN
Start: 2022-11-09 — End: 2022-11-25
  Filled 2022-11-09 (×3): qty 30, 8d supply, fill #0

## 2022-11-09 MED ORDER — VIBEGRON 75 MG PO TABS
75.0000 mg | ORAL_TABLET | Freq: Every day | ORAL | 5 refills | Status: DC
Start: 2022-11-09 — End: 2022-12-23
  Filled 2022-11-09: qty 30, 30d supply, fill #0
  Filled 2022-12-09: qty 30, 30d supply, fill #1

## 2022-11-09 MED ORDER — POLYETHYLENE GLYCOL 3350 17 GM/SCOOP PO POWD
17.0000 g | Freq: Every day | ORAL | 0 refills | Status: DC
Start: 2022-11-09 — End: 2023-02-22
  Filled 2022-11-09: qty 238, 14d supply, fill #0

## 2022-11-09 MED ORDER — ONDANSETRON HCL 4 MG PO TABS
4.0000 mg | ORAL_TABLET | Freq: Three times a day (TID) | ORAL | 0 refills | Status: AC | PRN
Start: 2022-11-09 — End: ?
  Filled 2022-11-09: qty 10, 4d supply, fill #0

## 2022-11-09 NOTE — Progress Notes (Unsigned)
Verbal consent was obtained to perform simple CMG procedure:   Prolapse was reduced using 2 large cotton swabs. Urethra was prepped with betadine and a 28F catheter was placed and bladder was drained completely. The bladder was then backfilled with sterile water by gravity.  First sensation: 100 First Desire: 150 Strong Desire: 200 Capacity: 400 Cough stress test was negative. Valsalva stress test was negative.  She was was allowed to void on her own.   Interpretation: CMG showed within normal limits sensation, and within normal limits cystometric capacity. Findings negative for stress incontinence, positive for detrusor overactivity.

## 2022-11-09 NOTE — Progress Notes (Unsigned)
[facility] Pre-Operative Exam  Subjective Chief Complaint: Tracy Hunt presents for a preoperative encounter.   History of Present Illness: Tracy Hunt is a 77 y.o. female who presents for preoperative visit.  She is scheduled to undergo Exam under anesthesia, colpocleisis, levator plication, perineorrhaphy, and cystoscopy.  Her symptoms include pelvic organ prolapse, and she was was found to have Stage II anterior, Stage II posterior, Stage I apical prolapse.   Simple CMG  CMG showed within normal limits sensation, and within normal limits cystometric capacity. Findings negative for stress incontinence, positive for detrusor overactivity.   Past Medical History:  Diagnosis Date   Acid reflux    Anemia    Arthritis    Bell's palsy 1988   C. difficile diarrhea    Cataract    Complication of anesthesia    Hard to wake up after a  colonoscopy before  around 2000   Diverticulitis    Dry eye    Family history of adverse reaction to anesthesia    mom hard to wake up and had lethargy after anesthesia   Glaucoma    Gout    Hepatitis 1986   secondary to varicella was in hospital for 2 days   Hiatal hernia    Hyperlipemia    Hypertension    Hypothyroidism    Malaria    as as child   Mumps    PONV (postoperative nausea and vomiting)    Pre-diabetes    Rosacea    Scoliosis    Sjogren's syndrome (HCC)      Past Surgical History:  Procedure Laterality Date   ABDOMINAL HYSTERECTOMY  1995   APPENDECTOMY  1995   CATARACT EXTRACTION     CHOLECYSTECTOMY     EYE SURGERY Bilateral    cataract   JOINT REPLACEMENT  2012   Left knee   JOINT REPLACEMENT     right knee 2017   REVERSE SHOULDER ARTHROPLASTY Left 10/23/2020   Procedure: REVERSE SHOULDER ARTHROPLASTY;  Surgeon: Beverely Low, MD;  Location: [facility];  Service: Orthopedics;  Laterality: Left;  with ISB   steroid injections     L3,L4, L5    is allergic to cosopt [dorzolamide  hcl-timolol mal], other, and oyster shell.   Family History  Problem Relation Age of Onset   Colon cancer Mother    Lung cancer Mother    Hypertension Mother    Diabetes Father    Breast cancer Paternal Grandmother     Social History   Tobacco Use   Smoking status: Never   Smokeless tobacco: Never  Vaping Use   Vaping status: Never Used  Substance Use Topics   Alcohol use: Not Currently   Drug use: Never     Review of Systems was negative for a full 10 system review except as noted in the History of Present Illness.   Current Outpatient Medications:    acetaminophen (TYLENOL) 500 MG tablet, Take 1 tablet (500 mg total) by mouth every 6 (six) hours as needed (pain)., Disp: 30 tablet, Rfl: 0   acetaminophen (TYLENOL) 650 MG CR tablet, Take 1,300 mg by mouth in the morning, at noon, and at bedtime. Takes 2 tablets in the morning, 2 in the afternoon, and 2 at bedtime., Disp: , Rfl:    brimonidine-timolol (COMBIGAN) 0.2-0.5 % ophthalmic solution, Place 1 drop into both eyes every 12 (twelve) hours., Disp: , Rfl:    Cholecalciferol (VITAMIN D) 50 MCG (2000 UT) tablet, Take 2,000  Units by mouth daily., Disp: , Rfl:    dicyclomine (BENTYL) 10 MG capsule, Take 1 capsule (10 mg total) by mouth 3 (three) times daily before meals., Disp: 270 capsule, Rfl: 2   gabapentin (NEURONTIN) 100 MG capsule, Take 1 tablet in the morning and bedtime along with 300mg  tablets. (Patient taking differently: Take 100 mg by mouth at bedtime.), Disp: 180 capsule, Rfl: 3   gabapentin (NEURONTIN) 300 MG capsule, Take 1 capsule (300 mg total) by mouth 3 (three) times daily., Disp: 480 capsule, Rfl: 1   ibuprofen (ADVIL) 600 MG tablet, Take 1 tablet (600 mg total) by mouth every 6 (six) hours as needed., Disp: 30 tablet, Rfl: 0   lipase/protease/amylase (CREON) 36000 UNITS CPEP capsule, Take 2 capsules (72,000 Units total) by mouth 3 (three) times daily with meals., Disp: 200 capsule, Rfl: 1   Menthol, Topical  Analgesic, (BIOFREEZE EX), Apply 1 drop topically daily as needed (back pain)., Disp: , Rfl:    Multiple Vitamins-Minerals (CENTRUM SILVER 50+WOMEN PO), Take 1 tablet by mouth daily., Disp: , Rfl:    ondansetron (ZOFRAN) 4 MG tablet, Take 1 tablet (4 mg total) by mouth every 8 (eight) hours as needed for nausea or vomiting., Disp: 10 tablet, Rfl: 0   oxyCODONE (OXY IR/ROXICODONE) 5 MG immediate release tablet, Take 1 tablet (5 mg total) by mouth every 4 (four) hours as needed for severe pain., Disp: 15 tablet, Rfl: 0   polyethylene glycol powder (GLYCOLAX/MIRALAX) 17 GM/SCOOP powder, Mix 17grams (1 scoop) in water and take daily as directed., Disp: 238 g, Rfl: 0   Propylene Glycol (SYSTANE COMPLETE OP), Place 1 drop into both eyes 2 (two) times daily as needed (dry eyes)., Disp: , Rfl:    rosuvastatin (CRESTOR) 10 MG tablet, Take 10 mg by mouth at bedtime., Disp: , Rfl:    triamterene-hydrochlorothiazide (MAXZIDE-25) 37.5-25 MG tablet, Take 1 tablet by mouth daily., Disp: 30 tablet, Rfl: 1   Vibegron 75 MG TABS, Take 1 tablet (75 mg total) by mouth daily., Disp: 30 tablet, Rfl: 5   levothyroxine (SYNTHROID) 112 MCG tablet, Take 112 mcg by mouth daily before breakfast., Disp: , Rfl:    Objective Vitals:   11/09/22 1400  BP: 128/73  Pulse: (!) 59    Gen: NAD CV: S1 S2 RRR Lungs: Clear to auscultation bilaterally Abd: soft, nontender   Previous Pelvic Exam showed: Normal external female genitalia; Bartholin's and Skene's glands normal in appearance; urethral meatus normal in appearance, no urethral masses or discharge.    CST: negative   s/p hysterectomy: Speculum exam reveals normal vaginal mucosa with  atrophy and normal vaginal cuff.  Adnexa  absent .       Pelvic floor strength II/V, puborectalis IV/V external anal sphincter IV/V   Pelvic floor musculature: Right levator non-tender, Right obturator non-tender, Left levator non-tender, Left obturator non-tender    POP-Q   1                                             Aa   1                                           Ba   1  C    5                                            Gh   5                                            Pb   5                                            tvl    1                                            Ap   1                                            Bp                                                  D      Assessment/ Plan  Assessment: The patient is a 77 y.o. year old scheduled to undergo Exam under anesthesia, colpocleisis, levator plication, perineorrhaphy, and cystoscopy.. Verbal consent was obtained for these procedures.  Plan: General Surgical Consent: The patient has previously been counseled on alternative treatments, and the decision by the patient and provider was to proceed with the procedure listed above.  For all procedures, there are risks of bleeding, infection, damage to surrounding organs including but not limited to bowel, bladder, blood vessels, ureters and nerves, and need for further surgery if an injury were to occur. These risks are all low with minimally invasive surgery.   There are risks of numbness and weakness at any body site or buttock/rectal pain.  It is possible that baseline pain can be worsened by surgery, either with or without mesh. If surgery is vaginal, there is also a low risk of possible conversion to laparoscopy or open abdominal incision where indicated. Very rare risks include blood transfusion, blood clot, heart attack, pneumonia, or death.   There is also a risk of short-term postoperative urinary retention with need to use a catheter. About half of patients need to go home from surgery with a catheter, which is then later removed in the office. The risk of long-term need for a catheter is very low. There is also a risk of worsening of overactive bladder.    Prolapse (with or without  mesh): Risk factors for surgical failure  include things that put pressure on your pelvis and the surgical repair, including obesity, chronic cough, and heavy lifting or straining (including lifting children or adults, straining on the toilet, or lifting heavy objects such as furniture or anything weighing >25 lbs. Risks of recurrence is 20-30% with vaginal native tissue repair and a less than 10% with  sacrocolpopexy with mesh.    We discussed consent for blood products. Risks for blood transfusion include allergic reactions, other reactions that can affect different body organs and managed accordingly, transmission of infectious diseases such as HIV or Hepatitis. However, the blood is screened. Patient consents for blood products.  Pre-operative instructions:  She was instructed to not take Aspirin/NSAIDs x 7days prior to surgery. Antibiotic prophylaxis was ordered as indicated.  Catheter use: Patient will go home with foley if needed after post-operative voiding trial.  Post-operative instructions:  She was provided with specific post-operative instructions, including precautions and signs/symptoms for which we would recommend contacting us, in addition to daytime and after-hours contact phone numbers. This was provided on a handout.   Post-operative medications: Prescriptions for motrin, tylenol, zofran, miralax, and oxycodone were sent to her pharmacy. Discussed using ibuprofen and tylenol on a schedule to limit use of narcotics.   Laboratory testing:  We will check labs: As requested by anesthesia.   Preoperative clearance:  She does not require surgical clearance.    Post-operative follow-up:  A post-operative appointment will be made for 6 weeks from the date of surgery. If she needs a post-operative nurse visit for a voiding trial, that will be set up after she leaves the hospital.    Patient will call the clinic or use MyChart should anything change or any new issues arise.   Selmer Dominion, NP   ***OR orders

## 2022-11-10 ENCOUNTER — Encounter: Payer: Self-pay | Admitting: Obstetrics and Gynecology

## 2022-11-10 NOTE — H&P (Signed)
[facility] H&P  Subjective Chief Complaint: Tracy Hunt presents for a preoperative encounter.   History of Present Illness: Tracy Hunt is a 77 y.o. female who presents for preoperative visit.  She is scheduled to undergo Exam under anesthesia, colpocleisis, levator plication, perineorrhaphy, and cystoscopy.  Her symptoms include pelvic organ prolapse, and she was was found to have Stage II anterior, Stage II posterior, Stage I apical prolapse.   Simple CMG  CMG showed within normal limits sensation, and within normal limits cystometric capacity. Findings negative for stress incontinence, positive for detrusor overactivity.   Past Medical History:  Diagnosis Date   Acid reflux    Anemia    Arthritis    Bell's palsy 1988   C. difficile diarrhea    Cataract    Complication of anesthesia    Hard to wake up after a  colonoscopy before  around 2000   Diverticulitis    Dry eye    Family history of adverse reaction to anesthesia    mom hard to wake up and had lethargy after anesthesia   Glaucoma    Gout    Hepatitis 1986   secondary to varicella was in hospital for 2 days   Hiatal hernia    Hyperlipemia    Hypertension    Hypothyroidism    Malaria    as as child   Mumps    PONV (postoperative nausea and vomiting)    Pre-diabetes    Rosacea    Scoliosis    Sjogren's syndrome (HCC)      Past Surgical History:  Procedure Laterality Date   ABDOMINAL HYSTERECTOMY  1995   APPENDECTOMY  1995   CATARACT EXTRACTION     CHOLECYSTECTOMY     EYE SURGERY Bilateral    cataract   JOINT REPLACEMENT  2012   Left knee   JOINT REPLACEMENT     right knee 2017   REVERSE SHOULDER ARTHROPLASTY Left 10/23/2020   Procedure: REVERSE SHOULDER ARTHROPLASTY;  Surgeon: Beverely Low, MD;  Location: [facility];  Service: Orthopedics;  Laterality: Left;  with ISB   steroid injections     L3,L4, L5    is allergic to cosopt [dorzolamide hcl-timolol mal], other,  and oyster shell.   Family History  Problem Relation Age of Onset   Colon cancer Mother    Lung cancer Mother    Hypertension Mother    Diabetes Father    Breast cancer Paternal Grandmother     Social History   Tobacco Use   Smoking status: Never   Smokeless tobacco: Never  Vaping Use   Vaping status: Never Used  Substance Use Topics   Alcohol use: Not Currently   Drug use: Never     Review of Systems was negative for a full 10 system review except as noted in the History of Present Illness.  No current facility-administered medications for this encounter.  Current Outpatient Medications:    acetaminophen (TYLENOL) 500 MG tablet, Take 1 tablet (500 mg total) by mouth every 6 (six) hours as needed (pain)., Disp: 30 tablet, Rfl: 0   acetaminophen (TYLENOL) 650 MG CR tablet, Take 1,300 mg by mouth in the morning, at noon, and at bedtime. Takes 2 tablets in the morning, 2 in the afternoon, and 2 at bedtime., Disp: , Rfl:    brimonidine-timolol (COMBIGAN) 0.2-0.5 % ophthalmic solution, Place 1 drop into both eyes every 12 (twelve) hours., Disp: , Rfl:    Cholecalciferol (VITAMIN D) 50  MCG (2000 UT) tablet, Take 2,000 Units by mouth daily., Disp: , Rfl:    dicyclomine (BENTYL) 10 MG capsule, Take 1 capsule (10 mg total) by mouth 3 (three) times daily before meals., Disp: 270 capsule, Rfl: 2   gabapentin (NEURONTIN) 100 MG capsule, Take 1 tablet in the morning and bedtime along with 300mg  tablets. (Patient taking differently: Take 100 mg by mouth at bedtime.), Disp: 180 capsule, Rfl: 3   gabapentin (NEURONTIN) 300 MG capsule, Take 1 capsule (300 mg total) by mouth 3 (three) times daily., Disp: 480 capsule, Rfl: 1   ibuprofen (ADVIL) 600 MG tablet, Take 1 tablet (600 mg total) by mouth every 6 (six) hours as needed., Disp: 30 tablet, Rfl: 0   levothyroxine (SYNTHROID) 112 MCG tablet, Take 112 mcg by mouth daily before breakfast., Disp: , Rfl:    lipase/protease/amylase (CREON) 36000  UNITS CPEP capsule, Take 2 capsules (72,000 Units total) by mouth 3 (three) times daily with meals., Disp: 200 capsule, Rfl: 1   Menthol, Topical Analgesic, (BIOFREEZE EX), Apply 1 drop topically daily as needed (back pain)., Disp: , Rfl:    Multiple Vitamins-Minerals (CENTRUM SILVER 50+WOMEN PO), Take 1 tablet by mouth daily., Disp: , Rfl:    ondansetron (ZOFRAN) 4 MG tablet, Take 1 tablet (4 mg total) by mouth every 8 (eight) hours as needed for nausea or vomiting., Disp: 10 tablet, Rfl: 0   oxyCODONE (OXY IR/ROXICODONE) 5 MG immediate release tablet, Take 1 tablet (5 mg total) by mouth every 4 (four) hours as needed for severe pain., Disp: 15 tablet, Rfl: 0   polyethylene glycol powder (GLYCOLAX/MIRALAX) 17 GM/SCOOP powder, Mix 17grams (1 scoop) in water and take daily as directed., Disp: 238 g, Rfl: 0   Propylene Glycol (SYSTANE COMPLETE OP), Place 1 drop into both eyes 2 (two) times daily as needed (dry eyes)., Disp: , Rfl:    rosuvastatin (CRESTOR) 10 MG tablet, Take 10 mg by mouth at bedtime., Disp: , Rfl:    triamterene-hydrochlorothiazide (MAXZIDE-25) 37.5-25 MG tablet, Take 1 tablet by mouth daily., Disp: 30 tablet, Rfl: 1   Vibegron 75 MG TABS, Take 1 tablet (75 mg total) by mouth daily., Disp: 30 tablet, Rfl: 5   Objective There were no vitals filed for this visit.   Gen: NAD CV: S1 S2 RRR Lungs: Clear to auscultation bilaterally Abd: soft, nontender   Previous Pelvic Exam showed: Normal external female genitalia; Bartholin's and Skene's glands normal in appearance; urethral meatus normal in appearance, no urethral masses or discharge.    CST: negative   s/p hysterectomy: Speculum exam reveals normal vaginal mucosa with  atrophy and normal vaginal cuff.  Adnexa  absent .       Pelvic floor strength II/V, puborectalis IV/V external anal sphincter IV/V   Pelvic floor musculature: Right levator non-tender, Right obturator non-tender, Left levator non-tender, Left obturator  non-tender    POP-Q   1                                            Aa   1                                           Ba   1  C    5                                            Gh   5                                            Pb   5                                            tvl    1                                            Ap   1                                            Bp                                                  D      Assessment/ Plan  Assessment: The patient is a 77 y.o. year old scheduled to undergo Exam under anesthesia, colpocleisis, levator plication, perineorrhaphy, and cystoscopy.. Verbal consent was obtained for these procedures.

## 2022-11-14 DIAGNOSIS — I10 Essential (primary) hypertension: Secondary | ICD-10-CM | POA: Diagnosis not present

## 2022-11-14 DIAGNOSIS — E785 Hyperlipidemia, unspecified: Secondary | ICD-10-CM | POA: Diagnosis not present

## 2022-11-14 DIAGNOSIS — E1140 Type 2 diabetes mellitus with diabetic neuropathy, unspecified: Secondary | ICD-10-CM | POA: Diagnosis not present

## 2022-11-14 DIAGNOSIS — R600 Localized edema: Secondary | ICD-10-CM | POA: Diagnosis not present

## 2022-11-17 ENCOUNTER — Other Ambulatory Visit (HOSPITAL_COMMUNITY): Payer: Self-pay

## 2022-11-17 MED ORDER — HYDROCHLOROTHIAZIDE 12.5 MG PO TABS
12.5000 mg | ORAL_TABLET | Freq: Every morning | ORAL | 1 refills | Status: DC
  Filled 2022-11-17: qty 30, 30d supply, fill #0
  Filled 2022-12-09 – 2022-12-12 (×2): qty 30, 30d supply, fill #1

## 2022-11-18 ENCOUNTER — Other Ambulatory Visit (HOSPITAL_COMMUNITY): Payer: Self-pay

## 2022-11-25 ENCOUNTER — Other Ambulatory Visit: Payer: Self-pay

## 2022-11-25 ENCOUNTER — Encounter (HOSPITAL_BASED_OUTPATIENT_CLINIC_OR_DEPARTMENT_OTHER): Payer: Self-pay | Admitting: Obstetrics and Gynecology

## 2022-11-25 NOTE — Progress Notes (Signed)
Spoke w/ via phone for pre-op interview---pt Lab needs dos---- I stat              Lab results------EKG 10-26-2022 epic COVID test -----patient states asymptomatic no test needed Arrive at -------1000 12-05-2022 NPO after MN NO Solid Food.  Clear liquids from MN until---900 Med rec completed Medications to take morning of surgery -----gabapentin, synthroid, eye drops Diabetic medication -----n/a Patient instructed no nail polish to be worn day of surgery Patient instructed to bring photo id and insurance card day of surgery Patient aware to have Driver (ride ) / caregiver    for 24 hours after surgery  daughter Tracy Hunt Patient Special Instructions -----none Pre-Op special Instructions -----none Patient verbalized understanding of instructions that were given at this phone interview. Patient denies shortness of breath, chest pain, fever, cough at this phone interview.

## 2022-11-28 DIAGNOSIS — R600 Localized edema: Secondary | ICD-10-CM | POA: Diagnosis not present

## 2022-11-28 DIAGNOSIS — E1140 Type 2 diabetes mellitus with diabetic neuropathy, unspecified: Secondary | ICD-10-CM | POA: Diagnosis not present

## 2022-11-28 DIAGNOSIS — E785 Hyperlipidemia, unspecified: Secondary | ICD-10-CM | POA: Diagnosis not present

## 2022-11-28 DIAGNOSIS — I10 Essential (primary) hypertension: Secondary | ICD-10-CM | POA: Diagnosis not present

## 2022-12-01 DIAGNOSIS — E1140 Type 2 diabetes mellitus with diabetic neuropathy, unspecified: Secondary | ICD-10-CM | POA: Diagnosis not present

## 2022-12-01 DIAGNOSIS — I10 Essential (primary) hypertension: Secondary | ICD-10-CM | POA: Diagnosis not present

## 2022-12-05 ENCOUNTER — Ambulatory Visit (HOSPITAL_BASED_OUTPATIENT_CLINIC_OR_DEPARTMENT_OTHER): Payer: Medicare HMO | Admitting: Anesthesiology

## 2022-12-05 ENCOUNTER — Encounter (HOSPITAL_BASED_OUTPATIENT_CLINIC_OR_DEPARTMENT_OTHER)
Admission: RE | Disposition: A | Discharge status: Home / Self Care | Payer: Self-pay | Source: Home / Self Care | Attending: Obstetrics and Gynecology

## 2022-12-05 ENCOUNTER — Ambulatory Visit (HOSPITAL_BASED_OUTPATIENT_CLINIC_OR_DEPARTMENT_OTHER)
Admission: RE | Admit: 2022-12-05 | Discharge: 2022-12-05 | Disposition: A | Discharge status: Home / Self Care | Payer: Medicare HMO | Attending: Obstetrics and Gynecology | Admitting: Obstetrics and Gynecology

## 2022-12-05 ENCOUNTER — Encounter (HOSPITAL_BASED_OUTPATIENT_CLINIC_OR_DEPARTMENT_OTHER): Payer: Self-pay | Admitting: Obstetrics and Gynecology

## 2022-12-05 ENCOUNTER — Other Ambulatory Visit: Payer: Self-pay

## 2022-12-05 DIAGNOSIS — M419 Scoliosis, unspecified: Secondary | ICD-10-CM | POA: Insufficient documentation

## 2022-12-05 DIAGNOSIS — N993 Prolapse of vaginal vault after hysterectomy: Secondary | ICD-10-CM | POA: Diagnosis not present

## 2022-12-05 DIAGNOSIS — K219 Gastro-esophageal reflux disease without esophagitis: Secondary | ICD-10-CM | POA: Insufficient documentation

## 2022-12-05 DIAGNOSIS — E039 Hypothyroidism, unspecified: Secondary | ICD-10-CM | POA: Diagnosis not present

## 2022-12-05 DIAGNOSIS — N8110 Cystocele, unspecified: Secondary | ICD-10-CM | POA: Diagnosis not present

## 2022-12-05 DIAGNOSIS — K449 Diaphragmatic hernia without obstruction or gangrene: Secondary | ICD-10-CM | POA: Diagnosis not present

## 2022-12-05 DIAGNOSIS — N819 Female genital prolapse, unspecified: Secondary | ICD-10-CM

## 2022-12-05 DIAGNOSIS — G510 Bell's palsy: Secondary | ICD-10-CM | POA: Diagnosis not present

## 2022-12-05 DIAGNOSIS — Z79899 Other long term (current) drug therapy: Secondary | ICD-10-CM | POA: Diagnosis not present

## 2022-12-05 DIAGNOSIS — Z01818 Encounter for other preprocedural examination: Secondary | ICD-10-CM

## 2022-12-05 DIAGNOSIS — N816 Rectocele: Secondary | ICD-10-CM | POA: Diagnosis not present

## 2022-12-05 DIAGNOSIS — I10 Essential (primary) hypertension: Secondary | ICD-10-CM | POA: Diagnosis not present

## 2022-12-05 DIAGNOSIS — M3500 Sicca syndrome, unspecified: Secondary | ICD-10-CM | POA: Diagnosis not present

## 2022-12-05 HISTORY — PX: RECTOCELE REPAIR: SHX761

## 2022-12-05 HISTORY — PX: CYSTOSCOPY: SHX5120

## 2022-12-05 HISTORY — PX: COLPOCLEISIS: SHX6814

## 2022-12-05 LAB — POCT I-STAT, CHEM 8
BUN: 17 mg/dL (ref 8–23)
Calcium, Ion: 1.37 mmol/L (ref 1.15–1.40)
Chloride: 100 mmol/L (ref 98–111)
Creatinine, Ser: 0.7 mg/dL (ref 0.44–1.00)
Glucose, Bld: 122 mg/dL — ABNORMAL HIGH (ref 70–99)
HCT: 42 % (ref 36.0–46.0)
Hemoglobin: 14.3 g/dL (ref 12.0–15.0)
Potassium: 3.9 mmol/L (ref 3.5–5.1)
Sodium: 142 mmol/L (ref 135–145)
TCO2: 27 mmol/L (ref 22–32)

## 2022-12-05 SURGERY — COLPOCLEISIS
Anesthesia: General | Site: Vagina

## 2022-12-05 MED ORDER — ONDANSETRON HCL 4 MG/2ML IJ SOLN
INTRAMUSCULAR | Status: AC
  Filled 2022-12-05: qty 2

## 2022-12-05 MED ORDER — OXYCODONE HCL 5 MG/5ML PO SOLN: 5.0000 mg | Freq: Once | ORAL | Status: DC | PRN

## 2022-12-05 MED ORDER — KETOROLAC TROMETHAMINE 30 MG/ML IJ SOLN
INTRAMUSCULAR | Status: AC
  Filled 2022-12-05: qty 1

## 2022-12-05 MED ORDER — ONDANSETRON HCL 4 MG/2ML IJ SOLN
INTRAMUSCULAR | Status: DC | PRN
Start: 2022-12-05 — End: 2022-12-05
  Administered 2022-12-05: 4 mg via INTRAVENOUS

## 2022-12-05 MED ORDER — LIDOCAINE 2% (20 MG/ML) 5 ML SYRINGE
INTRAMUSCULAR | Status: DC | PRN
  Administered 2022-12-05: 60 mg via INTRAVENOUS

## 2022-12-05 MED ORDER — LIDOCAINE HCL (PF) 2 % IJ SOLN
INTRAMUSCULAR | Status: AC
  Filled 2022-12-05: qty 5

## 2022-12-05 MED ORDER — ACETAMINOPHEN 500 MG PO TABS
1000.0000 mg | ORAL_TABLET | Freq: Once | ORAL | Status: AC
  Administered 2022-12-05: 1000 mg via ORAL

## 2022-12-05 MED ORDER — FENTANYL CITRATE (PF) 100 MCG/2ML IJ SOLN
INTRAMUSCULAR | Status: AC
  Filled 2022-12-05: qty 2

## 2022-12-05 MED ORDER — PHENYLEPHRINE 80 MCG/ML (10ML) SYRINGE FOR IV PUSH (FOR BLOOD PRESSURE SUPPORT)
PREFILLED_SYRINGE | INTRAVENOUS | Status: DC | PRN
Start: 2022-12-05 — End: 2022-12-05
  Administered 2022-12-05: 80 ug via INTRAVENOUS

## 2022-12-05 MED ORDER — FUROSEMIDE 10 MG/ML IJ SOLN
INTRAMUSCULAR | Status: DC | PRN
Start: 2022-12-05 — End: 2022-12-05
  Administered 2022-12-05: 5 mg via INTRAMUSCULAR

## 2022-12-05 MED ORDER — DEXAMETHASONE SODIUM PHOSPHATE 10 MG/ML IJ SOLN
INTRAMUSCULAR | Status: AC
  Filled 2022-12-05: qty 1

## 2022-12-05 MED ORDER — SODIUM CHLORIDE 0.9 % IR SOLN
Status: DC | PRN
  Administered 2022-12-05: 1000 mL

## 2022-12-05 MED ORDER — PROPOFOL 10 MG/ML IV BOLUS
INTRAVENOUS | Status: AC
  Filled 2022-12-05: qty 20

## 2022-12-05 MED ORDER — GABAPENTIN 300 MG PO CAPS: 300.0000 mg | ORAL_CAPSULE | ORAL | Status: DC

## 2022-12-05 MED ORDER — POVIDONE-IODINE 10 % EX SWAB: 2.0000 | Freq: Once | CUTANEOUS | Status: DC

## 2022-12-05 MED ORDER — PROPOFOL 10 MG/ML IV BOLUS
INTRAVENOUS | Status: DC | PRN
  Administered 2022-12-05: 120 mg via INTRAVENOUS
  Administered 2022-12-05: 25 ug/kg/min via INTRAVENOUS

## 2022-12-05 MED ORDER — LIDOCAINE-EPINEPHRINE 1 %-1:100000 IJ SOLN
INTRAMUSCULAR | Status: DC | PRN
  Administered 2022-12-05: 20 mL

## 2022-12-05 MED ORDER — PHENYLEPHRINE 80 MCG/ML (10ML) SYRINGE FOR IV PUSH (FOR BLOOD PRESSURE SUPPORT)
PREFILLED_SYRINGE | INTRAVENOUS | Status: AC
  Filled 2022-12-05: qty 10

## 2022-12-05 MED ORDER — FENTANYL CITRATE (PF) 100 MCG/2ML IJ SOLN: 25.0000 ug | INTRAMUSCULAR | Status: DC | PRN

## 2022-12-05 MED ORDER — ONDANSETRON HCL 4 MG/2ML IJ SOLN: 4.0000 mg | Freq: Once | INTRAMUSCULAR | Status: DC | PRN

## 2022-12-05 MED ORDER — DEXAMETHASONE SODIUM PHOSPHATE 10 MG/ML IJ SOLN
INTRAMUSCULAR | Status: DC | PRN
  Administered 2022-12-05: 5 mg via INTRAVENOUS

## 2022-12-05 MED ORDER — LACTATED RINGERS IV SOLN: INTRAVENOUS | Status: DC

## 2022-12-05 MED ORDER — PHENYLEPHRINE HCL-NACL 20-0.9 MG/250ML-% IV SOLN
INTRAVENOUS | Status: DC | PRN
Start: 2022-12-05 — End: 2022-12-05
  Administered 2022-12-05: 20 ug/min via INTRAVENOUS

## 2022-12-05 MED ORDER — OXYCODONE HCL 5 MG PO TABS: 5.0000 mg | ORAL_TABLET | Freq: Once | ORAL | Status: DC | PRN

## 2022-12-05 MED ORDER — ACETAMINOPHEN 500 MG PO TABS
ORAL_TABLET | ORAL | Status: AC
  Filled 2022-12-05: qty 2

## 2022-12-05 MED ORDER — CEFAZOLIN SODIUM-DEXTROSE 2-4 GM/100ML-% IV SOLN
INTRAVENOUS | Status: AC
  Filled 2022-12-05: qty 100

## 2022-12-05 MED ORDER — PHENAZOPYRIDINE HCL 100 MG PO TABS
200.0000 mg | ORAL_TABLET | ORAL | Status: AC
  Administered 2022-12-05: 200 mg via ORAL

## 2022-12-05 MED ORDER — CEFAZOLIN SODIUM-DEXTROSE 2-4 GM/100ML-% IV SOLN
2.0000 g | INTRAVENOUS | Status: AC
  Administered 2022-12-05: 2 g via INTRAVENOUS

## 2022-12-05 MED ORDER — FENTANYL CITRATE (PF) 100 MCG/2ML IJ SOLN
INTRAMUSCULAR | Status: DC | PRN
  Administered 2022-12-05 (×3): 25 ug via INTRAVENOUS

## 2022-12-05 MED ORDER — 0.9 % SODIUM CHLORIDE (POUR BTL) OPTIME
TOPICAL | Status: DC | PRN
  Administered 2022-12-05: 1000 mL

## 2022-12-05 MED ORDER — FUROSEMIDE 10 MG/ML IJ SOLN
INTRAMUSCULAR | Status: AC
  Filled 2022-12-05: qty 2

## 2022-12-05 MED ORDER — PHENAZOPYRIDINE HCL 100 MG PO TABS
ORAL_TABLET | ORAL | Status: AC
  Filled 2022-12-05: qty 2

## 2022-12-05 MED ORDER — ACETAMINOPHEN 500 MG PO TABS: 1000.0000 mg | ORAL_TABLET | ORAL | Status: DC

## 2022-12-05 MED ORDER — GABAPENTIN 300 MG PO CAPS
ORAL_CAPSULE | ORAL | Status: AC
  Filled 2022-12-05: qty 1

## 2022-12-05 SURGICAL SUPPLY — 31 items
BLADE SURG 15 STRL LF DISP TIS (BLADE) ×2 IMPLANT
BLADE SURG 15 STRL SS (BLADE) ×2
GLOVE BIOGEL PI IND STRL 6.5 (GLOVE) ×2 IMPLANT
GLOVE BIOGEL PI IND STRL 7.0 (GLOVE) ×2 IMPLANT
GLOVE ECLIPSE 6.0 STRL STRAW (GLOVE) ×2 IMPLANT
GLOVE SURG SS PI 6.5 STRL IVOR (GLOVE) IMPLANT
GOWN STRL REUS W/ TWL LRG LVL3 (GOWN DISPOSABLE) IMPLANT
GOWN STRL REUS W/TWL LRG LVL3 (GOWN DISPOSABLE) ×4 IMPLANT
HOLDER FOLEY CATH W/STRAP (MISCELLANEOUS) ×2 IMPLANT
IV NS 1000ML (IV SOLUTION) ×2
IV NS 1000ML BAXH (IV SOLUTION) IMPLANT
KIT TURNOVER CYSTO (KITS) ×2 IMPLANT
MANIFOLD NEPTUNE II (INSTRUMENTS) ×2 IMPLANT
NDL HYPO 22X1.5 SAFETY MO (MISCELLANEOUS) ×2 IMPLANT
NEEDLE HYPO 22X1.5 SAFETY MO (MISCELLANEOUS) ×2
NS IRRIG 1000ML POUR BTL (IV SOLUTION) ×2 IMPLANT
PACK CYSTO (CUSTOM PROCEDURE TRAY) ×2 IMPLANT
PACK VAGINAL WOMENS (CUSTOM PROCEDURE TRAY) ×2 IMPLANT
PAD OB MATERNITY 4.3X12.25 (PERSONAL CARE ITEMS) ×2 IMPLANT
RETRACTOR LONE STAR DISPOSABLE (INSTRUMENTS) ×2 IMPLANT
RETRACTOR STAY HOOK 5MM (MISCELLANEOUS) ×2 IMPLANT
SCRUB CHG 4% DYNA-HEX 4OZ (MISCELLANEOUS) ×2 IMPLANT
SET IRRIG Y TYPE TUR BLADDER L (SET/KITS/TRAYS/PACK) ×2 IMPLANT
SLEEVE SCD COMPRESS KNEE MED (STOCKING) ×2 IMPLANT
SPIKE FLUID TRANSFER (MISCELLANEOUS) IMPLANT
SUT VIC AB 0 CT1 27 (SUTURE) ×8
SUT VIC AB 0 CT1 27XBRD ANBCTR (SUTURE) IMPLANT
SUT VICRYL 2-0 SH 8X27 (SUTURE) ×2 IMPLANT
SYR BULB EAR ULCER 3OZ GRN STR (SYRINGE) ×2 IMPLANT
TOWEL OR 17X24 6PK STRL BLUE (TOWEL DISPOSABLE) ×2 IMPLANT
TRAY FOLEY W/BAG SLVR 14FR LF (SET/KITS/TRAYS/PACK) ×2 IMPLANT

## 2022-12-05 NOTE — Anesthesia Procedure Notes (Addendum)
Procedure Name: LMA Insertion Date/Time: 12/05/2022 11:34 AM  Performed by: Francie Massing, CRNAPre-anesthesia Checklist: Patient identified, Emergency Drugs available, Suction available and Patient being monitored Patient Re-evaluated:Patient Re-evaluated prior to induction Oxygen Delivery Method: Circle system utilized Preoxygenation: Pre-oxygenation with 100% oxygen Induction Type: IV induction Ventilation: Mask ventilation without difficulty LMA: LMA inserted LMA Size: 4.0 Number of attempts: 1 Airway Equipment and Method: Bite block Placement Confirmation: positive ETCO2 Tube secured with: Tape Dental Injury: Teeth and Oropharynx as per pre-operative assessment

## 2022-12-05 NOTE — Interval H&P Note (Signed)
History and Physical Interval Note:  12/05/2022 10:55 AM  Tracy Hunt  has presented today for surgery, with the diagnosis of anterior vaginal prolapse; posterior vaginal prolapse; prolapse of vaginal vault after hysterectomy.  The various methods of treatment have been discussed with the patient and family. After consideration of risks, benefits and other options for treatment, the patient has consented to  Procedure(s) with comments: COLPOCLEISIS (N/A)  Levator plication with perineorrhaphy (N/A) CYSTOSCOPY (N/A) as a surgical intervention.  The patient's history has been reviewed, patient examined, no change in status, stable for surgery.  I have reviewed the patient's chart and labs.  Questions were answered to the patient's satisfaction.     Marguerita Beards

## 2022-12-05 NOTE — Written Directive (Signed)
300cc instilled into bladder via cathetor. Cathetor balloon deflated and removed.patient assisted to restroom voided 350cc clear orange urine without difficulty.  Bladder scanned for 0cc.  Dr. Florian Buff notified . Margarita Mail rn

## 2022-12-05 NOTE — Anesthesia Postprocedure Evaluation (Signed)
Anesthesia Post Note  Patient: Tracy Hunt  Procedure(s) Performed: COLPOCLEISIS (Vagina ) Levator plication with perineorrhaphy (Vagina ) CYSTOSCOPY (Urethra)     Patient location during evaluation: PACU Anesthesia Type: General Level of consciousness: awake and alert Pain management: pain level controlled Vital Signs Assessment: post-procedure vital signs reviewed and stable Respiratory status: spontaneous breathing, nonlabored ventilation and respiratory function stable Cardiovascular status: stable and blood pressure returned to baseline Anesthetic complications: no   No notable events documented.  Last Vitals:  Vitals:   12/05/22 1345 12/05/22 1353  BP: (!) 152/81   Pulse: 61 (!) 59  Resp: 16 13  Temp:  (!) 36.3 C  SpO2: 99% 99%    Last Pain:  Vitals:   12/05/22 1353  TempSrc: Oral  PainSc:                  [name]

## 2022-12-05 NOTE — Transfer of Care (Signed)
Immediate Anesthesia Transfer of Care Note  Patient: Tracy Hunt  Procedure(s) Performed: Procedure(s) (LRB): COLPOCLEISIS (N/A) Levator plication with perineorrhaphy (N/A) CYSTOSCOPY (N/A)  Patient Location: PACU  Anesthesia Type: General  Level of Consciousness: awake, oriented, sedated and patient cooperative  Airway & Oxygen Therapy: Patient Spontanous Breathing and Patient connected to face mask oxygen  Post-op Assessment: Report given to PACU RN and Post -op Vital signs reviewed and stable  Post vital signs: Reviewed and stable  Complications: No apparent anesthesia complications  Last Vitals:  Vitals Value Taken Time  BP    Temp 36.4 C 12/05/22 1313  Pulse 59 12/05/22 1313  Resp 12 12/05/22 1313  SpO2 100 % 12/05/22 1313  Vitals shown include unfiled device data.  Last Pain:  Vitals:   12/05/22 1020  TempSrc: Oral  PainSc: 0-No pain      Patients Stated Pain Goal: 3 (12/05/22 1020)  Complications: No notable events documented.

## 2022-12-05 NOTE — Anesthesia Preprocedure Evaluation (Addendum)
Anesthesia Evaluation  Patient identified by MRN, date of birth, ID band Patient awake    Reviewed: Allergy & Precautions, NPO status , Patient's Chart, lab work & pertinent test results  History of Anesthesia Complications (+) PROLONGED EMERGENCE and history of anesthetic complications  Airway Mallampati: II  TM Distance: >3 FB Neck ROM: Full    Dental  (+) Dental Advisory Given, Caps   Pulmonary neg pulmonary ROS   Pulmonary exam normal        Cardiovascular hypertension, Pt. on medications Normal cardiovascular exam     Neuro/Psych  Bell's palsy   negative psych ROS   GI/Hepatic Neg liver ROS, hiatal hernia, PUD,GERD  Controlled,,  Endo/Other  Hypothyroidism   Pre-DM   Renal/GU negative Renal ROS     Musculoskeletal  (+) Arthritis ,   Scoliosis    Abdominal   Peds  Hematology negative hematology ROS (+)   Anesthesia Other Findings Sjogren's   Reproductive/Obstetrics                             Anesthesia Physical Anesthesia Plan  ASA: 2  Anesthesia Plan: General   Post-op Pain Management:    Induction: Intravenous  PONV Risk Score and Plan: 3 and Treatment may vary due to age or medical condition, Ondansetron and Propofol infusion  Airway Management Planned: LMA  Additional Equipment: None  Intra-op Plan:   Post-operative Plan: Extubation in OR  Informed Consent: I have reviewed the patients History and Physical, chart, labs and discussed the procedure including the risks, benefits and alternatives for the proposed anesthesia with the patient or authorized representative who has indicated his/her understanding and acceptance.     Dental advisory given  Plan Discussed with: CRNA and Anesthesiologist  Anesthesia Plan Comments:         Anesthesia Quick Evaluation

## 2022-12-05 NOTE — Discharge Instructions (Addendum)

## 2022-12-05 NOTE — Op Note (Signed)
Operative Note  Preoperative Diagnosis: anterior vaginal prolapse, posterior vaginal prolapse, and vaginal vault prolapse after hysterectomy  Postoperative Diagnosis: same  Procedures performed:  Colpocleisis, cystoscopy, levator plication, perineorrhaphy  Implants: none  Attending Surgeon: Lanetta Inch, MD  Anesthesia: General LMA  Findings: On vaginal exam, eversion of the vaginal cuff was noted  On cystoscopy, normal bladder and urethra, without injury or lesion. Brisk bilateral ureteral efflux noted.     Specimens: none  Estimated blood loss: 50 mL  IV fluids: 800 mL  Urine output: 300 mL  Complications: none  Procedure in Detail:  After informed consent was obtained, the patient was taken to the operating room where she was placed under anesthesia.  She was then placed in the dorsal lithotomy position, taking care to avoid traction on the extremities. She was then prepped and draped in the usual sterile fashion.  A self-retaining lonestar retractor was placed using four elastic blue stays.  After a foley catheter was inserted into the urethra, the location of the midurethra was palpated. Allis clamps were placed at the vaginal apex. 1% lidocaine with epinephrine was injected into the vaginal mucosa circumferentially.  A marking pen was used to delineate the vaginal mucosa to remove.  An incision was made with a 15 blade scalpel along these marked lines. After an Allis clamp was placed on the vaginal mucosa, the underlying vaginal muscularis was dissected off the mucosa.  The vaginal mucosa was excised off each quadrant. Excellent hemostasis was obtained with cautery.  Serial pursestring sutures were used to reduce the vaginal vault prolapse using 2-0 vicryl. Anterior and posterior plication sutures were placed to reapproximate the epithelium. The mucosa was then closed with a 0-vicryl suture in a running fashion.    The Foley catheter was removed.  A 70-degree cystoscope  was introduced, and 360-degree inspection revealed no trauma to the bladder, with bilateral ureteral efflux.  The bladder was drained and the cystoscope was removed.  The Foley catheter was reinserted.   Attention was then turned to the perineum.  Two Allis clamps were placed at the level of the hymenal ring and 1% lidocaine with epinephrine was injected into the vaginal mucosa. A diamond shaped incision was made over the perineum with a 15 blade scalpel, and the epithelium was removed.  The underlying tissue was then dissected off of the epithelium bilaterally with Metzenbaum scissors. 0- vicryl suture was used to approximate the levator muscles to the midline.  A 0-Vicryl suture was used to reapproximate the perineal body. A 2-0 vicryl suture was then used to close the epithelium in a subcutaneous and subcuticular fashion.  The vagina was copiously irrigated.  Hemostasis was noted.   The patient tolerated the procedure well.  She was awakened from anesthesia and transferred to the recovery room in stable condition. All counts were correct x 2.    Marguerita Beards, MD

## 2022-12-06 ENCOUNTER — Telehealth: Payer: Self-pay | Admitting: Obstetrics and Gynecology

## 2022-12-06 ENCOUNTER — Encounter (HOSPITAL_BASED_OUTPATIENT_CLINIC_OR_DEPARTMENT_OTHER): Payer: Self-pay | Admitting: Obstetrics and Gynecology

## 2022-12-06 DIAGNOSIS — I10 Essential (primary) hypertension: Secondary | ICD-10-CM | POA: Diagnosis not present

## 2022-12-06 NOTE — Telephone Encounter (Signed)
Patient reports she cannot even tell she had surgery. She reports her pain is 0/10. She is not taking narcotic pain medication, only tylenol and ibuprofen.  She reports she had minimal blood yesterday and no blood today.  Reports she has had some burning sensation with urination.  Has not yet had a bowel movement. She has been taking Miralax and colace.  Overall she feels like she is doing well. She states she will call and let us know if she has any issues.

## 2022-12-06 NOTE — Telephone Encounter (Signed)
[facility] underwent Colpocleisis, cystoscopy, levator plication, perineorrhaphy on 12/05/22.   She passed her voiding trial.  was backfilled into the bladder Voided  PVR by bladder scan was 0ml.   She was discharged without a catheter. Please call her for a routine post op check. Thanks!  Marguerita Beards, MD

## 2022-12-09 ENCOUNTER — Other Ambulatory Visit (HOSPITAL_COMMUNITY): Payer: Self-pay

## 2022-12-09 ENCOUNTER — Other Ambulatory Visit: Payer: Self-pay

## 2022-12-09 MED ORDER — OLMESARTAN MEDOXOMIL 40 MG PO TABS
40.0000 mg | ORAL_TABLET | Freq: Every day | ORAL | 1 refills | Status: DC
  Filled 2022-12-09: qty 90, 90d supply, fill #0

## 2022-12-12 ENCOUNTER — Other Ambulatory Visit: Payer: Self-pay

## 2022-12-15 ENCOUNTER — Other Ambulatory Visit (HOSPITAL_COMMUNITY): Payer: Self-pay

## 2022-12-15 MED ORDER — OLMESARTAN MEDOXOMIL 20 MG PO TABS
20.0000 mg | ORAL_TABLET | Freq: Every day | ORAL | 0 refills | Status: DC
  Filled 2022-12-15: qty 30, 30d supply, fill #0

## 2022-12-19 DIAGNOSIS — H04123 Dry eye syndrome of bilateral lacrimal glands: Secondary | ICD-10-CM | POA: Diagnosis not present

## 2022-12-19 DIAGNOSIS — H401131 Primary open-angle glaucoma, bilateral, mild stage: Secondary | ICD-10-CM | POA: Diagnosis not present

## 2022-12-22 ENCOUNTER — Encounter: Payer: Self-pay | Admitting: Cardiovascular Disease

## 2022-12-22 NOTE — Progress Notes (Signed)
Cardiology Office Note:  .   Date:  12/23/2022  ID:  Tracy Hunt, DOB May 22, 1945, MRN [id number] PCP: Chilton Greathouse, MD  [facility] Providers Cardiologist:  Laymond Postle    History of Present Illness: .   Tracy Hunt is a 77 y.o. female with hx of HTN. HLD   We were asked to see her for further evaluation for her hyperlipidemia   She has been adjusting her BP meds She is now just taking Olmesartan 20 mg a day  No cp or dyspnea    Tries to walk some . Goes to the gym once a week  - works with a Psychologist, educational   Originally from [place]  Came to [name] in 1960s    ROS:   Studies Reviewed: .         Risk Assessment/Calculations:             Physical Exam:   VS:  BP 132/82   Pulse 77   Ht 5\' 3"  (1.6 m)   Wt 163 lb (73.9 kg)   SpO2 98%   BMI 28.87 kg/m    Wt Readings from Last 3 Encounters:  12/23/22 163 lb (73.9 kg)  12/05/22 156 lb (70.8 kg)  07/25/22 159 lb (72.1 kg)    GEN: Well nourished, well developed in no acute distress NECK: No JVD; No carotid bruits CARDIAC: RRR, no murmurs, rubs, gallops RESPIRATORY:  Clear to auscultation without rales, wheezing or rhonchi  ABDOMEN: Soft, non-tender, non-distended EXTREMITIES:  No edema; No deformity   ASSESSMENT AND PLAN: .   1.  Hypertension: Blood pressure is very well-controlled on olmesartan 20 mg a day.  She is now off HCTZ and potassium.  She seems to be doing very well.  Will continue those medications.  I have advised her to work on getting a little bit more exercise.  Have her monitor to eat a low-salt diet.  2.  Hyperlipidemia: Her lipids are well-controlled on rosuvastatin.  She will continue to follow-up with her primary medical doctor.         Dispo: PRN   Signed, Kristeen Miss, MD

## 2022-12-23 ENCOUNTER — Ambulatory Visit: Payer: Medicare HMO | Attending: Cardiovascular Disease | Admitting: Cardiovascular Disease

## 2022-12-23 ENCOUNTER — Encounter: Payer: Self-pay | Admitting: Cardiovascular Disease

## 2022-12-23 VITALS — BP 132/82 | HR 77 | Ht 63.0 in | Wt 163.0 lb

## 2022-12-23 DIAGNOSIS — E782 Mixed hyperlipidemia: Secondary | ICD-10-CM

## 2022-12-23 DIAGNOSIS — I10 Essential (primary) hypertension: Secondary | ICD-10-CM | POA: Diagnosis not present

## 2022-12-23 DIAGNOSIS — E785 Hyperlipidemia, unspecified: Secondary | ICD-10-CM | POA: Insufficient documentation

## 2022-12-23 NOTE — Patient Instructions (Signed)
Medication Instructions:  Your physician recommends that you continue on your current medications as directed. Please refer to the Current Medication list given to you today.  *If you need a refill on your cardiac medications before your next appointment, please call your pharmacy*   Lab Work: NONE If you have labs (blood work) drawn today and your tests are completely normal, you will receive your results only by: MyChart Message (if you have MyChart) OR A paper copy in the mail If you have any lab test that is abnormal or we need to change your treatment, we will call you to review the results.   Testing/Procedures: NONE   Follow-Up: At [facility], you and your health needs are our priority.  As part of our continuing mission to provide you with exceptional heart care, we have created designated Provider Care Teams.  These Care Teams include your primary Cardiologist (physician) and Advanced Practice Providers (APPs -  Physician Assistants and Nurse Practitioners) who all work together to provide you with the care you need, when you need it.  We recommend signing up for the patient portal called "MyChart".  Sign up information is provided on this After Visit Summary.  MyChart is used to connect with patients for Virtual Visits (Telemedicine).  Patients are able to view lab/test results, encounter notes, upcoming appointments, etc.  Non-urgent messages can be sent to your provider as well.   To learn more about what you can do with MyChart, go to [email or web address].    Your next appointment:   As Needed  Provider:   Philip Nahser, MD   

## 2022-12-26 ENCOUNTER — Other Ambulatory Visit (HOSPITAL_COMMUNITY): Payer: Self-pay

## 2022-12-26 ENCOUNTER — Encounter: Payer: Self-pay | Admitting: Obstetrics and Gynecology

## 2022-12-26 DIAGNOSIS — N952 Postmenopausal atrophic vaginitis: Secondary | ICD-10-CM

## 2022-12-26 MED ORDER — ESTRADIOL 0.1 MG/GM VA CREA
0.5000 g | TOPICAL_CREAM | VAGINAL | 11 refills | Status: AC
Start: 2022-12-26 — End: ?

## 2022-12-28 DIAGNOSIS — E785 Hyperlipidemia, unspecified: Secondary | ICD-10-CM | POA: Diagnosis not present

## 2022-12-28 DIAGNOSIS — E039 Hypothyroidism, unspecified: Secondary | ICD-10-CM | POA: Diagnosis not present

## 2022-12-28 DIAGNOSIS — E1140 Type 2 diabetes mellitus with diabetic neuropathy, unspecified: Secondary | ICD-10-CM | POA: Diagnosis not present

## 2022-12-28 DIAGNOSIS — Z1212 Encounter for screening for malignant neoplasm of rectum: Secondary | ICD-10-CM | POA: Diagnosis not present

## 2022-12-28 DIAGNOSIS — Z0189 Encounter for other specified special examinations: Secondary | ICD-10-CM | POA: Diagnosis not present

## 2022-12-28 DIAGNOSIS — R7301 Impaired fasting glucose: Secondary | ICD-10-CM | POA: Diagnosis not present

## 2022-12-28 DIAGNOSIS — I10 Essential (primary) hypertension: Secondary | ICD-10-CM | POA: Diagnosis not present

## 2022-12-28 DIAGNOSIS — Z0000 Encounter for general adult medical examination without abnormal findings: Secondary | ICD-10-CM | POA: Diagnosis not present

## 2023-01-02 DIAGNOSIS — R251 Tremor, unspecified: Secondary | ICD-10-CM | POA: Diagnosis not present

## 2023-01-02 DIAGNOSIS — M1A09X0 Idiopathic chronic gout, multiple sites, without tophus (tophi): Secondary | ICD-10-CM | POA: Diagnosis not present

## 2023-01-02 DIAGNOSIS — M79645 Pain in left finger(s): Secondary | ICD-10-CM | POA: Diagnosis not present

## 2023-01-02 DIAGNOSIS — M1991 Primary osteoarthritis, unspecified site: Secondary | ICD-10-CM | POA: Diagnosis not present

## 2023-01-02 DIAGNOSIS — G5793 Unspecified mononeuropathy of bilateral lower limbs: Secondary | ICD-10-CM | POA: Diagnosis not present

## 2023-01-02 DIAGNOSIS — E663 Overweight: Secondary | ICD-10-CM | POA: Diagnosis not present

## 2023-01-02 DIAGNOSIS — M359 Systemic involvement of connective tissue, unspecified: Secondary | ICD-10-CM | POA: Diagnosis not present

## 2023-01-02 DIAGNOSIS — Z6827 Body mass index (BMI) 27.0-27.9, adult: Secondary | ICD-10-CM | POA: Diagnosis not present

## 2023-01-04 DIAGNOSIS — G609 Hereditary and idiopathic neuropathy, unspecified: Secondary | ICD-10-CM | POA: Diagnosis not present

## 2023-01-04 DIAGNOSIS — E785 Hyperlipidemia, unspecified: Secondary | ICD-10-CM | POA: Diagnosis not present

## 2023-01-04 DIAGNOSIS — K8689 Other specified diseases of pancreas: Secondary | ICD-10-CM | POA: Diagnosis not present

## 2023-01-04 DIAGNOSIS — I10 Essential (primary) hypertension: Secondary | ICD-10-CM | POA: Diagnosis not present

## 2023-01-04 DIAGNOSIS — K439 Ventral hernia without obstruction or gangrene: Secondary | ICD-10-CM | POA: Diagnosis not present

## 2023-01-04 DIAGNOSIS — I700 Atherosclerosis of aorta: Secondary | ICD-10-CM | POA: Diagnosis not present

## 2023-01-04 DIAGNOSIS — Z1331 Encounter for screening for depression: Secondary | ICD-10-CM | POA: Diagnosis not present

## 2023-01-04 DIAGNOSIS — E1140 Type 2 diabetes mellitus with diabetic neuropathy, unspecified: Secondary | ICD-10-CM | POA: Diagnosis not present

## 2023-01-04 DIAGNOSIS — R82998 Other abnormal findings in urine: Secondary | ICD-10-CM | POA: Diagnosis not present

## 2023-01-04 DIAGNOSIS — Z23 Encounter for immunization: Secondary | ICD-10-CM | POA: Diagnosis not present

## 2023-01-04 DIAGNOSIS — Z0000 Encounter for general adult medical examination without abnormal findings: Secondary | ICD-10-CM | POA: Diagnosis not present

## 2023-01-04 DIAGNOSIS — M3505 Sjogren syndrome with inflammatory arthritis: Secondary | ICD-10-CM | POA: Diagnosis not present

## 2023-01-04 DIAGNOSIS — Z1339 Encounter for screening examination for other mental health and behavioral disorders: Secondary | ICD-10-CM | POA: Diagnosis not present

## 2023-01-07 ENCOUNTER — Other Ambulatory Visit (HOSPITAL_COMMUNITY): Payer: Self-pay

## 2023-01-09 ENCOUNTER — Other Ambulatory Visit: Payer: Self-pay | Admitting: Obstetrics and Gynecology

## 2023-01-09 ENCOUNTER — Other Ambulatory Visit (HOSPITAL_COMMUNITY): Payer: Self-pay

## 2023-01-09 DIAGNOSIS — R350 Frequency of micturition: Secondary | ICD-10-CM

## 2023-01-09 DIAGNOSIS — N3281 Overactive bladder: Secondary | ICD-10-CM

## 2023-01-09 MED ORDER — OLMESARTAN MEDOXOMIL 20 MG PO TABS
20.0000 mg | ORAL_TABLET | Freq: Every day | ORAL | 3 refills | Status: DC
  Filled 2023-01-09: qty 90, 90d supply, fill #0

## 2023-01-09 MED ORDER — OLMESARTAN MEDOXOMIL 20 MG PO TABS
20.0000 mg | ORAL_TABLET | Freq: Every day | ORAL | 3 refills | Status: AC
  Filled 2023-01-09: qty 90, 90d supply, fill #0
  Filled 2023-03-27: qty 90, 90d supply, fill #1

## 2023-01-10 ENCOUNTER — Other Ambulatory Visit: Payer: Self-pay | Admitting: Obstetrics and Gynecology

## 2023-01-10 MED ORDER — VIBEGRON 75 MG PO TABS: 75.0000 mg | ORAL_TABLET | Freq: Every day | ORAL | 2 refills | Status: DC

## 2023-01-10 NOTE — Progress Notes (Signed)
Gemtesa sent in at patient's request.

## 2023-01-18 ENCOUNTER — Encounter: Payer: Self-pay | Admitting: Obstetrics and Gynecology

## 2023-01-18 ENCOUNTER — Ambulatory Visit (INDEPENDENT_AMBULATORY_CARE_PROVIDER_SITE_OTHER): Payer: Medicare HMO | Admitting: Obstetrics and Gynecology

## 2023-01-18 VITALS — BP 135/79 | HR 71

## 2023-01-18 DIAGNOSIS — Z48816 Encounter for surgical aftercare following surgery on the genitourinary system: Secondary | ICD-10-CM | POA: Diagnosis not present

## 2023-01-18 DIAGNOSIS — Z9889 Other specified postprocedural states: Secondary | ICD-10-CM

## 2023-01-18 DIAGNOSIS — N3281 Overactive bladder: Secondary | ICD-10-CM

## 2023-01-18 NOTE — Progress Notes (Signed)
[facility]  Date of Visit: 01/18/2023  History of Present Illness: [name] is a 77 y.o. female scheduled today for a post-operative visit.   Surgery: s/p Colpocleisis, cystoscopy, levator plication, perineorrhaphy on 12/05/22  She passed her postoperative void trial.   Postoperative course has been uncomplicated.   She was started on Gemtesa for OAB symptoms. She was having more urgency at night with no control. Now when she wakes up at night, she does not leak. Wakes up once a night. Does not leak during the day usually. Has also been using vaginal estrogen cream. She has a small spot with some irritation on the perineum. Also reports a lump higher up on the vulva.   UTI in the last 6 weeks? No  Pain? No  She has not returned to her normal activity (except for postop restrictions) Vaginal bulge? No  Stress incontinence: Yes - once when her bladder was very full, just a small leakage.  Urgency/frequency: No - improved with Gemtesa Urge incontinence: No  Voiding dysfunction: No  Bowel issues: No   Subjective Success: Do you usually have a bulge or something falling out that you can see or feel in the vaginal area? No  Retreatment Success: Any retreatment with surgery or pessary for any compartment? No    Medications: She has a current medication list which includes the following prescription(s): acetaminophen, vitamin d, dicyclomine, estradiol, gabapentin, gabapentin, levothyroxine, lipase/protease/amylase, menthol (topical analgesic), multiple vitamins-minerals, olmesartan, olmesartan, ondansetron, polyethylene glycol powder, propylene glycol, rosuvastatin, and vibegron.   Allergies: Patient is allergic to cosopt [dorzolamide hcl-timolol mal], morphine, other, and oyster shell.   Physical Exam: BP 135/79   Pulse 71    Pelvic Examination: 0.5cm mole present on left upper vulva- normal appearing. Vagina: perineal sutures present, no erythema or discharge.  Incisions healing well. Sutures are not present at incision line and there is not granulation tissue. No tenderness along the anterior or posterior vagina. No apical tenderness. No pelvic masses.   POP-Q: POP-Q  -2                                            Aa   -2                                           Ba  -1.5                                              C   3.5                                            Gh  6                                            Pb  2  tvl   -2                                            Ap  -2                                            Bp                                                 D   --------------------------------------  Assessment and Plan: No diagnosis found.  - continue with estrogen twice a week. Will have her return in a month to examine perineal incision.  - Can resume regular activity including exercise. - Discussed avoidance of heavy lifting and straining long term to reduce the risk of recurrence.  - Continue gemtesa for OAB  Return 1 month  Marguerita Beards, MD

## 2023-01-30 DIAGNOSIS — K8689 Other specified diseases of pancreas: Secondary | ICD-10-CM | POA: Diagnosis not present

## 2023-01-30 DIAGNOSIS — K219 Gastro-esophageal reflux disease without esophagitis: Secondary | ICD-10-CM | POA: Diagnosis not present

## 2023-01-30 DIAGNOSIS — I10 Essential (primary) hypertension: Secondary | ICD-10-CM | POA: Diagnosis not present

## 2023-01-30 DIAGNOSIS — K5792 Diverticulitis of intestine, part unspecified, without perforation or abscess without bleeding: Secondary | ICD-10-CM | POA: Diagnosis not present

## 2023-01-30 DIAGNOSIS — K5900 Constipation, unspecified: Secondary | ICD-10-CM | POA: Diagnosis not present

## 2023-01-30 DIAGNOSIS — K439 Ventral hernia without obstruction or gangrene: Secondary | ICD-10-CM | POA: Diagnosis not present

## 2023-01-30 DIAGNOSIS — D72819 Decreased white blood cell count, unspecified: Secondary | ICD-10-CM | POA: Diagnosis not present

## 2023-01-30 DIAGNOSIS — R1013 Epigastric pain: Secondary | ICD-10-CM | POA: Diagnosis not present

## 2023-02-02 ENCOUNTER — Other Ambulatory Visit (HOSPITAL_COMMUNITY): Payer: Self-pay

## 2023-02-09 ENCOUNTER — Ambulatory Visit: Payer: Self-pay | Admitting: General Surgery

## 2023-02-09 DIAGNOSIS — K432 Incisional hernia without obstruction or gangrene: Secondary | ICD-10-CM | POA: Diagnosis not present

## 2023-02-09 NOTE — H&P (Signed)
Chief Complaint: New Consultation       History of Present Illness: Tracy Hunt is a 77 y.o. female who is seen today as an office consultation at the request of Dr. Nedra Hai for evaluation of New Consultation .   Patient is a 77 year old female who comes in secondary to an incisional hernia in the right upper quadrant.  Patient states that she had a lap chole done in the past.  States that she had a very large gallbladder at this time.  She states that she has not some pain discomfort right upper quadrant.  Patient was hospitalized the beginning of this year secondary to significant GI symptoms.  Patient underwent CT scan.  Patient found to have an incisional hernia in the right upper quadrant.  She states that she is having more pain discomfort has been ongoing.  States it is very sporadic.   She has noted some asymmetry of her abdominal wall.   CT scan I reviewed that showed a 3.6 cm fat-containing incisional hernia.  This appears to be at the extraction point of the right upper quadrant hernia.               Review of Systems: A complete review of systems was obtained from the patient.  I have reviewed this information and discussed as appropriate with the patient.  See HPI as well for other ROS.   Review of Systems  Constitutional:  Negative for fever.  HENT:  Negative for congestion.   Eyes:  Negative for blurred vision.  Respiratory:  Negative for cough, shortness of breath and wheezing.   Cardiovascular:  Negative for chest pain and palpitations.  Gastrointestinal:  Negative for heartburn.  Genitourinary:  Negative for dysuria.  Musculoskeletal:  Negative for myalgias.  Skin:  Negative for rash.  Neurological:  Negative for dizziness and headaches.  Psychiatric/Behavioral:  Negative for depression and suicidal ideas.   All other systems reviewed and are negative.       Medical History: Past Medical History      Past Medical History:  Diagnosis Date   Arthritis      GERD (gastroesophageal reflux disease)     Glaucoma (increased eye pressure)     Hyperlipidemia     Hypertension     Thyroid disease           Problem List  There is no problem list on file for this patient.      Past Surgical History       Past Surgical History:  Procedure Laterality Date   APPENDECTOMY       CATARACT EXTRACTION       CHOLECYSTECTOMY       COLPOCLEISIS       HYSTERECTOMY       JOINT REPLACEMENT       Shoulder surgery            Allergies       Allergies  Allergen Reactions   Shellfish Derived Diarrhea      Only oysters   Morphine Other (See Comments)      Makes me hyper        Medications Ordered Prior to Encounter        Current Outpatient Medications on File Prior to Visit  Medication Sig Dispense Refill   acetaminophen (TYLENOL) 650 MG ER tablet Take 650 mg by mouth every 8 (eight) hours as needed       brimonidine-timoloL (COMBIGAN) 0.2-0.5 % ophthalmic solution Apply 1 drop to  eye every 12 (twelve) hours       cholecalciferol (VITAMIN D3) 1000 unit tablet Take 2,000 Units by mouth once daily       dicyclomine (BENTYL) 10 mg capsule Take 10 mg by mouth 4 (four) times daily before meals and nightly       estradioL (ESTRACE) 1 MG tablet         gabapentin (NEURONTIN) 100 MG capsule Take 1 tablet in the morning and bedtime along with 300mg  tablets.       levothyroxine (SYNTHROID) 112 MCG tablet Take 112 mcg by mouth       olmesartan (BENICAR) 20 MG tablet Take 20 mg by mouth once daily       ondansetron (ZOFRAN) 4 MG tablet Take 4 mg by mouth every 8 (eight) hours as needed       pancrelipase (CREON) 36,000-114,000-180,000 unit DR capsule Take 72,000 Units by mouth       rosuvastatin (CRESTOR) 10 MG tablet Take 10 mg by mouth once daily       multivitamin tablet Take 1 tablet by mouth once daily        No current facility-administered medications on file prior to visit.        Family History       Family History  Problem Relation Age  of Onset   High blood pressure (Hypertension) Mother     Colon cancer Mother     Diabetes Father     Stroke Sister          Tobacco Use History  Social History       Tobacco Use  Smoking Status Never  Smokeless Tobacco Never        Social History  Social History        Socioeconomic History   Marital status: Widowed  Tobacco Use   Smoking status: Never   Smokeless tobacco: Never  Substance and Sexual Activity   Alcohol use: Never   Drug use: Never    Social Drivers of Health        Food Insecurity: No Food Insecurity (04/16/2022)    Received from [facility]    Hunger Vital Sign     Worried About Running Out of Food in the Last Year: Never true     Ran Out of Food in the Last Year: Never true  Transportation Needs: No Transportation Needs (04/16/2022)    Received from [facility]     Lack of Transportation (Medical): No     Lack of Transportation (Non-Medical): No    Received from [facility]    Social Network        Objective:         Vitals:    02/09/23 1023  BP: (!) 140/80  Pulse: 84  Temp: 36.4 C (97.6 F)  SpO2: 98%  Weight: 73.7 kg (162 lb 6.4 oz)  Height: 160 cm (5\' 3" )  PainSc: 0-No pain  PainLoc: Abdomen    Body mass index is 28.77 kg/m.   Physical Exam Constitutional:      Appearance: Normal appearance.  HENT:     Head: Normocephalic and atraumatic.     Mouth/Throat:     Mouth: Mucous membranes are moist.     Pharynx: Oropharynx is clear.  Eyes:     General: No scleral icterus.    Pupils: Pupils are equal, round, and reactive to light.  Cardiovascular:     Rate and Rhythm: Normal rate  and regular rhythm.     Pulses: Normal pulses.     Heart sounds: No murmur heard.    No friction rub. No gallop.  Pulmonary:     Effort: Pulmonary effort is normal. No respiratory distress.     Breath sounds: Normal breath sounds. No stridor.  Abdominal:     General: Abdomen is flat.     Hernia: A hernia is  present.     Musculoskeletal:        General: No swelling.  Skin:    General: Skin is warm.  Neurological:     General: No focal deficit present.     Mental Status: She is alert and oriented to person, place, and time. Mental status is at baseline.  Psychiatric:        Mood and Affect: Mood normal.        Thought Content: Thought content normal.        Judgment: Judgment normal.        Hernia Size: 4 cm Incarcerated:  Initial Hernia     Assessment and Plan:  Diagnoses and all orders for this visit:   Incisional hernia without obstruction or gangrene     Tracy Hunt is a 77 y.o. female     We will proceed to the OR for a robotic incisional hernia repair with mesh (TAPP). All risks and benefits were discussed with the patient, to generally include infection, bleeding, damage to surrounding structures, acute and chronic nerve pain, and recurrence. Alternatives were offered and described.  All questions were answered and the patient voiced understanding of the procedure and wishes to proceed at this point.             No follow-ups on file.   Axel Filler, MD, [facility], [place] [facility]

## 2023-02-14 ENCOUNTER — Ambulatory Visit
Admission: RE | Admit: 2023-02-14 | Discharge: 2023-02-14 | Disposition: A | Discharge status: Home / Self Care | Payer: Medicare HMO | Source: Ambulatory Visit | Attending: Physician Assistant | Admitting: Physician Assistant

## 2023-02-14 ENCOUNTER — Other Ambulatory Visit (INDEPENDENT_AMBULATORY_CARE_PROVIDER_SITE_OTHER): Payer: Medicare HMO

## 2023-02-14 ENCOUNTER — Ambulatory Visit: Payer: Medicare HMO | Admitting: Physician Assistant

## 2023-02-14 ENCOUNTER — Encounter: Payer: Self-pay | Admitting: Physician Assistant

## 2023-02-14 VITALS — BP 104/60 | HR 76 | Ht 63.0 in | Wt 163.1 lb

## 2023-02-14 DIAGNOSIS — K5641 Fecal impaction: Secondary | ICD-10-CM | POA: Diagnosis not present

## 2023-02-14 DIAGNOSIS — K5900 Constipation, unspecified: Secondary | ICD-10-CM | POA: Diagnosis not present

## 2023-02-14 DIAGNOSIS — K580 Irritable bowel syndrome with diarrhea: Secondary | ICD-10-CM

## 2023-02-14 DIAGNOSIS — K8689 Other specified diseases of pancreas: Secondary | ICD-10-CM

## 2023-02-14 DIAGNOSIS — A0472 Enterocolitis due to Clostridium difficile, not specified as recurrent: Secondary | ICD-10-CM

## 2023-02-14 LAB — SEDIMENTATION RATE: Sed Rate: 35 mm/h — ABNORMAL HIGH (ref 0–30)

## 2023-02-14 LAB — CBC WITH DIFFERENTIAL/PLATELET
Basophils Absolute: 0 10*3/uL (ref 0.0–0.1)
Basophils Relative: 0.3 % (ref 0.0–3.0)
Eosinophils Absolute: 0.1 10*3/uL (ref 0.0–0.7)
Eosinophils Relative: 3.5 % (ref 0.0–5.0)
HCT: 39.8 % (ref 36.0–46.0)
Hemoglobin: 13.1 g/dL (ref 12.0–15.0)
Lymphocytes Relative: 44 % (ref 12.0–46.0)
Lymphs Abs: 1.5 10*3/uL (ref 0.7–4.0)
MCHC: 32.8 g/dL (ref 30.0–36.0)
MCV: 98.7 fL (ref 78.0–100.0)
Monocytes Absolute: 0.4 10*3/uL (ref 0.1–1.0)
Monocytes Relative: 11.5 % (ref 3.0–12.0)
Neutro Abs: 1.4 10*3/uL (ref 1.4–7.7)
Neutrophils Relative %: 40.7 % — ABNORMAL LOW (ref 43.0–77.0)
Platelets: 152 10*3/uL (ref 150.0–400.0)
RBC: 4.03 Mil/uL (ref 3.87–5.11)
RDW: 13.3 % (ref 11.5–15.5)
WBC: 3.4 10*3/uL — ABNORMAL LOW (ref 4.0–10.5)

## 2023-02-14 LAB — COMPREHENSIVE METABOLIC PANEL
ALT: 8 U/L (ref 0–35)
AST: 15 U/L (ref 0–37)
Albumin: 4 g/dL (ref 3.5–5.2)
Alkaline Phosphatase: 68 U/L (ref 39–117)
BUN: 14 mg/dL (ref 6–23)
CO2: 31 meq/L (ref 19–32)
Calcium: 9.7 mg/dL (ref 8.4–10.5)
Chloride: 102 meq/L (ref 96–112)
Creatinine, Ser: 0.68 mg/dL (ref 0.40–1.20)
GFR: 84.04 mL/min (ref >60.00)
Glucose, Bld: 59 mg/dL — ABNORMAL LOW (ref 70–99)
Potassium: 4 meq/L (ref 3.5–5.1)
Sodium: 138 meq/L (ref 135–145)
Total Bilirubin: 0.4 mg/dL (ref 0.2–1.2)
Total Protein: 7.6 g/dL (ref 6.0–8.3)

## 2023-02-14 NOTE — Patient Instructions (Addendum)
Your provider has requested that you go to the basement level for lab work before leaving today. Press "B" on the elevator. The lab is located at the first door on the left as you exit the elevator.  Your provider has requested that you have an abdominal x ray before leaving today. Please go to the basement floor to our Radiology department for the test.   You have been scheduled for an appointment with Dr. Tomasa Rand on 05-12-2023 at 1050am . Please arrive 10 minutes early for your appointment.  We have given you samples of the following medication to take: IBguard  Take the dicyclomine as needed rather than daily, can take it before going out to dinner, or if you have AB pain. Can take IBGARD, samples given this is natural, can cause GERD so if this happens stop it. This is over the counte.r  Continue high fiber diet, consider adding on benefiber Consider pelvic floor PT  FIBER SUPPLEMENT You can do metamucil or fibercon once or twice a day but if this causes gas/bloating please switch to Benefiber or Citracel.  Fiber is good for constipation/diarrhea/irritable bowel syndrome.  It can also help with weight loss and can help lower your bad cholesterol (LDL).  Please do 1 TBSP in the morning in water, coffee, or tea.  It can take up to a month before you can see a difference with your bowel movements.  It is cheapest from [employer], [employer], [employer].   Here some information about pelvic floor dysfunction. This may be contributing to some of your symptoms. We will continue with our evaluation but I do want you to consider We could also refer to pelvic floor physical therapy.   Pelvic Floor Dysfunction, Female Pelvic floor dysfunction (PFD) is a condition that results when the group of muscles and connective tissues that support the organs in the pelvis (pelvic floor muscles) do not work well. These muscles and their connections form a sling that supports the colon and bladder. In women,  they also support the uterus. PFD causes pelvic floor muscles to be too weak, too tight, or both. In PFD, muscle movements are not coordinated. This may cause bowel or bladder problems. It may also cause pain. What are the causes? This condition may be caused by an injury to the pelvic area or by a weakening of pelvic muscles. This often results from pregnancy and childbirth or other types of strain. In many cases, the exact cause is not known. What increases the risk? The following factors may make you more likely to develop this condition: Having chronic bladder tissue inflammation (interstitial cystitis). Being an older person. Being overweight. History of radiation treatment for cancer in the pelvic region. Previous pelvic surgery, such as removal of the uterus (hysterectomy). What are the signs or symptoms? Symptoms of this condition vary and may include: Bladder symptoms, such as: Trouble starting urination and emptying the bladder. Frequent urinary tract infections. Leaking urine when coughing, laughing, or exercising (stress incontinence). Having to pass urine urgently or frequently. Pain when passing urine. Bowel symptoms, such as: Constipation. Urgent or frequent bowel movements. Incomplete bowel movements. Painful bowel movements. Leaking stool or gas. Unexplained genital or rectal pain. Genital or rectal muscle spasms. Low back pain. Other symptoms may include: A heavy, full, or aching feeling in the vagina. A bulge that protrudes into the vagina. Pain during or after sex. How is this diagnosed? This condition may be diagnosed based on: Your symptoms and medical history. A physical exam.  During the exam, your health care provider may check your pelvic muscles for tightness, spasm, pain, or weakness. This may include a rectal exam and a pelvic exam. In some cases, you may have diagnostic tests, such as: Electrical muscle function tests. Urine flow testing. X-ray  tests of bowel function. Ultrasound of the pelvic organs. How is this treated? Treatment for this condition depends on the symptoms. Treatment options include: Physical therapy. This may include Kegel exercises to help relax or strengthen the pelvic floor muscles. Biofeedback. This type of therapy provides feedback on how tight your pelvic floor muscles are so that you can learn to control them. Internal or external massage therapy. A treatment that involves electrical stimulation of the pelvic floor muscles to help control pain (transcutaneous electrical nerve stimulation, or TENS). Sound wave therapy (ultrasound) to reduce muscle spasms. Medicines, such as: Muscle relaxants. Bladder control medicines. Surgery to reconstruct or support pelvic floor muscles may be an option if other treatments do not help. Follow these instructions at home: Activity Do your usual activities as told by your health care provider. Ask your health care provider if you should modify any activities. Do pelvic floor strengthening or relaxing exercises at home as told by your physical therapist. Lifestyle Maintain a healthy weight. Eat foods that are high in fiber, such as beans, whole grains, and fresh fruits and vegetables. Limit foods that are high in fat and processed sugars, such as fried or sweet foods. Manage stress with relaxation techniques such as yoga or meditation. General instructions If you have problems with leakage: Use absorbable pads or wear padded underwear. Wash frequently with mild soap. Keep your genital and anal area as clean and dry as possible. Ask your health care provider if you should try a barrier cream to prevent skin irritation. Take warm baths to relieve pelvic muscle tension or spasms. Take over-the-counter and prescription medicines only as told by your health care provider. Keep all follow-up visits. How is this prevented? The cause of PFD is not always known, but there are  a few things you can do to reduce the risk of developing this condition, including: Staying at a healthy weight. Getting regular exercise. Managing stress. Contact a health care provider if: Your symptoms are not improving with home care. You have signs or symptoms of PFD that get worse at home. You develop new signs or symptoms. You have signs of a urinary tract infection, such as: Fever. Chills. Increased urinary frequency. A burning feeling when urinating. You have not had a bowel movement in 3 days (constipation). Summary Pelvic floor dysfunction results when the muscles and connective tissues in your pelvic floor do not work well. These muscles and their connections form a sling that supports your colon and bladder. In women, they also support the uterus. PFD may be caused by an injury to the pelvic area or by a weakening of pelvic muscles. PFD causes pelvic floor muscles to be too weak, too tight, or a combination of both. Symptoms may vary from person to person. In most cases, PFD can be treated with physical therapies and medicines. Surgery may be an option if other treatments do not help. This information is not intended to replace advice given to you by your health care provider. Make sure you discuss any questions you have with your health care provider. Document Revised: 07/15/2020 Document Reviewed: 07/15/2020 Elsevier Patient Education  2022 [employer] Inc.   EPI Do smaller more frequent meals Try to do low fat Do  more lean protein Avoid too much fiber like lentils and beans- do smaller portions.  Avoid alcohol and do not smoke.  Remember to take the pills 2 with every meal and 1 with snacks  Pancreatitic Eating Plan What are tips for following this plan? Reading food labels Use the information on food labels to help keep track of how much fat you eat: Check the serving size. Look for the amount of total fat in grams (g) in one serving. Low-fat foods have 3 g of  fat or less per serving. Fat-free foods have 0.5 g of fat or less per serving. Keep track of how much fat you eat based on how many servings you eat. For example, if you eat two servings, the amount of fat you eat will be two times what is listed on the label. Shopping  Buy low-fat or nonfat foods, such as: Fresh, frozen, or canned fruits and vegetables. Grains, including pasta, bread, and rice. Lean meat, poultry, fish, and other protein foods. Low-fat or nonfat dairy. Avoid buying bakery products and other sweets made with whole milk, butter, and eggs. Avoid buying snack foods with added fat, such as anything with butter or cheese flavoring. Cooking Remove skin from poultry, and remove extra fat from meat. Limit the amount of fat and oil you use to 6 teaspoons or less per day. Cook using low-fat methods, such as boiling, broiling, grilling, steaming, or baking. Use spray oil to cook. Add fat-free chicken broth to add flavor and moisture. Avoid adding cream to thicken soups or sauces. Use other thickeners such as corn starch or tomato paste. Meal planning  Eat a low-fat diet as told by your dietitian. For most people, this means having no more than 55-65 grams of fat each day. Eat small, frequent meals throughout the day. For example, you may have 5-6 small meals instead of 3 large meals. Drink enough fluid to keep your urine pale yellow. Do not drink alcohol. Talk to your health care provider if you need help stopping. Limit how much caffeine you have, including black coffee, black and green tea, caffeinated soft drinks, and energy drinks. General information Let your health care provider or dietitian know if you have unplanned weight loss on this eating plan. You may be instructed to follow a clear liquid diet during a flare of symptoms. Talk with your health care provider about how to manage your diet during symptoms of a flare. Take any vitamins or supplements as told by your health  care provider. Work with a Data processing manager, especially if you have other conditions such as obesity or diabetes mellitus. What foods should I avoid? Fruits Fried fruits. Fruits served with butter or cream. Vegetables Fried vegetables. Vegetables cooked with butter, cheese, or cream. Grains Biscuits, waffles, donuts, pastries, and croissants. Pies and cookies. Butter-flavored popcorn. Regular crackers. Meats and other protein foods Fatty cuts of meat. Poultry with skin. Organ meats. Bacon, sausage, and cold cuts. Whole eggs. Nuts and nut butters. Dairy Whole and 2% milk. Whole milk yogurt. Whole milk ice cream. Cream and half-and-half. Cream cheese. Sour cream. Cheese. Beverages Wine, beer, and liquor. The items listed above may not be a complete list of foods and beverages to avoid. Contact a dietitian for more information.  This information is not intended to replace advice given to you by your health care provider. Make sure you discuss any questions you have with your health care provider. Document Revised: 06/28/2018 Document Reviewed: 06/13/2017 Elsevier Patient Education  2022 Elsevier  Inc.

## 2023-02-14 NOTE — Progress Notes (Signed)
02/14/2023 Tracy Hunt [id number] 1945/09/06  Referring provider: Chilton Greathouse, MD Primary GI doctor: Dr. Russella Dar  ASSESSMENT AND PLAN:     Gastrointestinal Symptoms Episodes of constipation followed by diarrhea and weakness. No recent antibiotics or weight loss. Currently on Colace and dicyclomine.  Likely overflow diarrhea worsened with recent dicyclomine use daily. -Reduce dicyclomine to as-needed use for cramping or abdominal discomfort, likely worsening constipation -Continue high fiber diet. -Check labs and obtain stool sample to rule out C. diff. -Obtain abdominal x-ray to evaluate stool burden -Provide samples of IBGard for daily use to help with stomach cramping. - likely component of pelvic floor dysfunction patient with recent rectocele/cystocele, given information  Pancreatic Insufficiency On Creon for pancreatic enzyme replacement. -Continue Creon with meals. -Provide patient education on the use of Creon with meals and snacks.  Follow-up appointment to be scheduled.      Patient Care Team: Chilton Greathouse, MD as PCP - General (Internal Medicine) Glendale Chard, DO as Consulting Physician (Neurology)  HISTORY OF PRESENT ILLNESS: 77 y.o. female with a past medical history of pancreatic insufficiency, IBS, history of diverticulitis, C. difficile colitis and others listed below presents for evaluation of stomach pain, diarrhea and cramping.   2021 Colon with Dr. Loreta Ave 3 mm polyp and diverticulosis 05/19/2022 office visit with Dr. Russella Dar for follow-up of C. difficile colitis initially treated with vancomycin but had ongoing fever treated with 10-day course of Dificid.  Given trial of dicyclomine. 12/05/2022 i-STAT chemical with Hgb 14.3, BUN 17, creatinine 0.70, potassium 3.9  She has had 3 episodes in last few weeks with constipation, nausea, AB pain and then she will have diarrhea. She will have weakness, rest for a while and feel better. This was  similar to what was happening before her Cdiff colitis.  She is on colace in the morning, otherwise she does not take anything.  She has tried diet to help her bowel.  She takes the dicyclomine and creon with every time she eats. She has had some AB bloating/burping last few days.  She has gained about 4 lbs, no weight loss.  No fever, chills, no ABX.   Discussed the use of AI scribe software for clinical note transcription with the patient, who gave verbal consent to proceed.  History of Present Illness   The patient, with a history of gastrointestinal issues including a recent C. diff infection, presents with intermittent episodes of constipation followed by diarrhea and associated weakness. These symptoms are reminiscent of those experienced prior to her previous gastrointestinal complications. The patient has been managing her bowel movements primarily through dietary modifications and the use of Colace. She reports that these episodes have occurred approximately three times in the past few months.  The patient is currently on a regimen of dicyclomine, taken three times a day before meals, and Creon, taken before breakfast, lunch, and dinner. She reports that the dicyclomine has been helpful. She has also been experiencing bloating after meals in recent days, which is a new symptom for her. Despite these gastrointestinal issues, the patient has gained about four pounds and denies any fevers, chills, or recent antibiotic use.  The patient underwent surgery in September, for cystocele and rectocele. She has questions about the use of Creon, particularly in relation to meal frequency and size. She also expresses concerns about her pancreas function and whether further checks are necessary. The patient's diet is high in fiber, consisting of cooked vegetables, fruits, and oatmeal. She reports that  her bowel movements are usually formed and soft, but she has been experiencing some irregularities.       She  reports that she has never smoked. She has never used smokeless tobacco. She reports that she does not currently use alcohol. She reports that she does not use drugs.  RELEVANT LABS AND IMAGING:  CBC    Component Value Date/Time   WBC 3.0 (L) 10/23/2022 1117   RBC 4.41 10/23/2022 1117   HGB 14.3 12/05/2022 1014   HCT 42.0 12/05/2022 1014   PLT 138 (L) 10/23/2022 1117   MCV 94.6 10/23/2022 1117   MCH 32.2 10/23/2022 1117   MCHC 34.1 10/23/2022 1117   RDW 13.2 10/23/2022 1117   LYMPHSABS 1.4 10/23/2022 1117   MONOABS 0.3 10/23/2022 1117   EOSABS 0.1 10/23/2022 1117   BASOSABS 0.0 10/23/2022 1117   Recent Labs    04/08/22 0636 04/15/22 1926 04/16/22 0433 04/18/22 1223 04/19/22 0442 04/19/22 1633 04/20/22 0458 04/21/22 0432 10/23/22 1117 12/05/22 1014  HGB 10.9* 13.4 12.8 11.8* 11.3* 12.3 11.4* 11.3* 14.2 14.3    CMP     Component Value Date/Time   NA 142 12/05/2022 1014   K 3.9 12/05/2022 1014   CL 100 12/05/2022 1014   CO2 29 10/23/2022 1117   GLUCOSE 122 (H) 12/05/2022 1014   BUN 17 12/05/2022 1014   CREATININE 0.70 12/05/2022 1014   CALCIUM 10.0 10/23/2022 1117   PROT 5.7 (L) 04/18/2022 1223   ALBUMIN 2.1 (L) 04/18/2022 1223   AST 32 04/18/2022 1223   ALT 12 04/18/2022 1223   ALKPHOS 40 04/18/2022 1223   BILITOT 0.7 04/18/2022 1223   GFRNONAA >60 10/23/2022 1117      Latest Ref Rng & Units 04/18/2022   12:23 PM 04/15/2022    7:26 PM 04/04/2022    2:46 PM  Hepatic Function  Total Protein 6.5 - 8.1 g/dL 5.7  7.1  6.8   Albumin 3.5 - 5.0 g/dL 2.1  3.1  2.7   AST 15 - 41 U/L 32  22  21   ALT 0 - 44 U/L 12  12  12    Alk Phosphatase 38 - 126 U/L 40  48  62   Total Bilirubin 0.3 - 1.2 mg/dL 0.7  0.8  0.8       Current Medications:   Current Outpatient Medications (Endocrine & Metabolic):    levothyroxine (SYNTHROID) 112 MCG tablet, Take 112 mcg by mouth daily before breakfast.  Current Outpatient Medications (Cardiovascular):    olmesartan  (BENICAR) 20 MG tablet, Take 1 tablet (20 mg total) by mouth daily.   rosuvastatin (CRESTOR) 10 MG tablet, Take 10 mg by mouth at bedtime.   Current Outpatient Medications (Analgesics):    acetaminophen (TYLENOL) 650 MG CR tablet, Take 1,300 mg by mouth in the morning, at noon, and at bedtime. Takes 2 tablets in the morning, 2 in the afternoon, and 2 at bedtime.   Current Outpatient Medications (Other):    Cholecalciferol (VITAMIN D) 50 MCG (2000 UT) tablet, Take 2,000 Units by mouth daily.   dicyclomine (BENTYL) 10 MG capsule, Take 1 capsule (10 mg total) by mouth 3 (three) times daily before meals.   estradiol (ESTRACE) 0.1 MG/GM vaginal cream, Place 0.5 g vaginally 2 (two) times a week. Place 0.5g nightly for two weeks then twice a week after   gabapentin (NEURONTIN) 100 MG capsule, Take 1 tablet in the morning and bedtime along with 300mg  tablets. (Patient taking differently: Take 100 mg  by mouth at bedtime.)   gabapentin (NEURONTIN) 300 MG capsule, Take 1 capsule (300 mg total) by mouth 3 (three) times daily.   lipase/protease/amylase (CREON) 36000 UNITS CPEP capsule, Take 2 capsules (72,000 Units total) by mouth 3 (three) times daily with meals.   Menthol, Topical Analgesic, (BIOFREEZE EX), Apply 1 drop topically daily as needed (back pain).   Multiple Vitamin (MULTI-VITAMIN) tablet, Take 1 tablet by mouth daily.   Multiple Vitamins-Minerals (CENTRUM SILVER 50+WOMEN PO), Take 1 tablet by mouth daily.   ondansetron (ZOFRAN) 4 MG tablet, Take 1 tablet (4 mg total) by mouth every 8 (eight) hours as needed for nausea or vomiting.   polyethylene glycol powder (GLYCOLAX/MIRALAX) 17 GM/SCOOP powder, Mix 17grams (1 scoop) in water and take daily as directed. (Patient taking differently: Take 17 g by mouth as needed.)   Propylene Glycol (SYSTANE COMPLETE OP), Place 1 drop into both eyes 2 (two) times daily as needed (dry eyes).   Vibegron 75 MG TABS, Take 1 tablet (75 mg total) by mouth  daily.  Medical History:  Past Medical History:  Diagnosis Date   Acid reflux    Anemia    Arthritis    Bell's palsy 1988   C. difficile diarrhea 03/2022   Complication of anesthesia    Hard to wake up after a  colonoscopy before  around 2000   Diverticulitis    Dry eye    Family history of adverse reaction to anesthesia    mom hard to wake up and had lethargy after anesthesia   Glaucoma    Gout    Hepatitis 1986   secondary to varicella was in hospital for 2 days   Hiatal hernia    Hyperlipemia    Hypertension    Hypothyroidism    Malaria    as as child   Mumps    Pre-diabetes    Rosacea    Scoliosis    Sjogren's syndrome (HCC)    Allergies:  Allergies  Allergen Reactions   Cosopt [Dorzolamide Hcl-Timolol Mal] Anaphylaxis    "Very irritated"   Morphine     Makes me hyper   Other Itching    Adhesive tape    Oyster Shell Hives     Surgical History:  She  has a past surgical history that includes Abdominal hysterectomy (1995); Appendectomy (1995); Joint replacement (2012); Cholecystectomy (2013); steroid injections; Eye surgery (Bilateral); Joint replacement; Reverse shoulder arthroplasty (Left, 10/23/2020); Cataract extraction (Bilateral); lumbar radial frequency ablation (2014); Colpocleisis (N/A, 12/05/2022); Rectocele repair (N/A, 12/05/2022); and Cystoscopy (N/A, 12/05/2022). Family History:  Her family history includes Breast cancer in her paternal grandmother; CVA in her sister; Colon cancer in her mother; Dementia in her sister; Diabetes in her father; Hypertension in her mother; Lung cancer in her mother.  REVIEW OF SYSTEMS  : All other systems reviewed and negative except where noted in the History of Present Illness.  PHYSICAL EXAM: BP 104/60   Pulse 76   Ht 5\' 3"  (1.6 m)   Wt 163 lb 2 oz (74 kg)   BMI 28.90 kg/m  General Appearance: Well nourished, in no apparent distress. Head:   Normocephalic and atraumatic. Eyes:  sclerae anicteric,conjunctive  pink  Respiratory: Respiratory effort normal, BS equal bilaterally without rales, rhonchi, wheezing. Cardio: RRR with no MRGs. Peripheral pulses intact.  Abdomen: Soft,  Obese ,active bowel sounds. No tenderness . No masses. Rectal: Not evaluated Musculoskeletal: Full ROM, Normal gait. Without edema. Skin:  Dry and intact without significant lesions or  rashes Neuro: Alert and  oriented x4;  No focal deficits. Psych:  Cooperative. Normal mood and affect.    Doree Albee, PA-C 11:51 AM

## 2023-02-20 ENCOUNTER — Other Ambulatory Visit: Payer: Self-pay

## 2023-02-20 ENCOUNTER — Other Ambulatory Visit: Payer: Medicare HMO

## 2023-02-20 DIAGNOSIS — A0472 Enterocolitis due to Clostridium difficile, not specified as recurrent: Secondary | ICD-10-CM

## 2023-02-20 DIAGNOSIS — K580 Irritable bowel syndrome with diarrhea: Secondary | ICD-10-CM

## 2023-02-21 LAB — C. DIFFICILE GDH AND TOXIN A/B
GDH ANTIGEN: DETECTED
MICRO NUMBER:: 15796633
SPECIMEN QUALITY:: ADEQUATE
TOXIN A AND B: NOT DETECTED

## 2023-02-21 LAB — CLOSTRIDIUM DIFFICILE TOXIN B, QUALITATIVE, REAL-TIME PCR: Toxigenic C. Difficile by PCR: NOT DETECTED

## 2023-02-22 ENCOUNTER — Ambulatory Visit (INDEPENDENT_AMBULATORY_CARE_PROVIDER_SITE_OTHER): Payer: Medicare HMO | Admitting: Obstetrics and Gynecology

## 2023-02-22 ENCOUNTER — Encounter: Payer: Self-pay | Admitting: Obstetrics and Gynecology

## 2023-02-22 VITALS — BP 146/71 | HR 62

## 2023-02-22 DIAGNOSIS — Z48816 Encounter for surgical aftercare following surgery on the genitourinary system: Secondary | ICD-10-CM

## 2023-02-22 DIAGNOSIS — Z9889 Other specified postprocedural states: Secondary | ICD-10-CM

## 2023-02-22 NOTE — Patient Instructions (Signed)
Take Gemtesa once a day.   Can continue estrogen cream twice a week if desired. Can also use coconut oil for moisture in the vagina and vulva.

## 2023-02-22 NOTE — Progress Notes (Signed)
[facility]  Date of Visit: 02/22/2023  History of Present Illness: [name] is a 77 y.o. female scheduled today for a post-operative visit.   Surgery: s/p Colpocleisis, cystoscopy, levator plication, perineorrhaphy on 12/05/22  She passed her postoperative void trial.   Postoperative course has been uncomplicated.   Has been using the vaginal estrogen cream. Denies bleeding. Tracy Hunt is working well to control her leakage and urgency.    Medications: She has a current medication list which includes the following prescription(s): acetaminophen, vitamin d, dicyclomine, estradiol, gabapentin, gabapentin, levothyroxine, lipase/protease/amylase, menthol (topical analgesic), multi-vitamin, multiple vitamins-minerals, olmesartan, ondansetron, propylene glycol, rosuvastatin, and vibegron.   Allergies: Patient is allergic to cosopt [dorzolamide hcl-timolol mal], morphine, other, and oyster shell.   Physical Exam: BP (!) 146/71   Pulse 62    Pelvic Examination: Perineum and vaginal suture lines well healed, no sutures present.     --------------------------------------  Assessment and Plan:  1. Post-operative state     - continue with estrogen twice a week. Will have her return in a month to examine perineal incision.  - Can resume regular activity.  - Continue gemtesa for OAB  Return 1 year or sooner if needed  Marguerita Beards, MD

## 2023-03-28 DIAGNOSIS — H524 Presbyopia: Secondary | ICD-10-CM | POA: Diagnosis not present

## 2023-03-28 DIAGNOSIS — H401131 Primary open-angle glaucoma, bilateral, mild stage: Secondary | ICD-10-CM | POA: Diagnosis not present

## 2023-04-05 ENCOUNTER — Emergency Department (HOSPITAL_COMMUNITY): Payer: Medicare HMO

## 2023-04-05 ENCOUNTER — Encounter (HOSPITAL_COMMUNITY): Payer: Self-pay

## 2023-04-05 ENCOUNTER — Other Ambulatory Visit: Payer: Self-pay

## 2023-04-05 ENCOUNTER — Inpatient Hospital Stay (HOSPITAL_COMMUNITY)
Admission: EM | Admit: 2023-04-05 | Discharge: 2023-04-09 | DRG: 872 | Disposition: A | Discharge status: Home / Self Care | Payer: Medicare HMO | Attending: Family Medicine | Admitting: Family Medicine

## 2023-04-05 DIAGNOSIS — Z96612 Presence of left artificial shoulder joint: Secondary | ICD-10-CM | POA: Diagnosis present

## 2023-04-05 DIAGNOSIS — Z823 Family history of stroke: Secondary | ICD-10-CM

## 2023-04-05 DIAGNOSIS — I10 Essential (primary) hypertension: Secondary | ICD-10-CM | POA: Diagnosis not present

## 2023-04-05 DIAGNOSIS — K219 Gastro-esophageal reflux disease without esophagitis: Secondary | ICD-10-CM | POA: Diagnosis present

## 2023-04-05 DIAGNOSIS — R61 Generalized hyperhidrosis: Secondary | ICD-10-CM | POA: Diagnosis not present

## 2023-04-05 DIAGNOSIS — Z96653 Presence of artificial knee joint, bilateral: Secondary | ICD-10-CM | POA: Diagnosis present

## 2023-04-05 DIAGNOSIS — Z9049 Acquired absence of other specified parts of digestive tract: Secondary | ICD-10-CM

## 2023-04-05 DIAGNOSIS — Z8249 Family history of ischemic heart disease and other diseases of the circulatory system: Secondary | ICD-10-CM | POA: Diagnosis not present

## 2023-04-05 DIAGNOSIS — E876 Hypokalemia: Secondary | ICD-10-CM | POA: Diagnosis not present

## 2023-04-05 DIAGNOSIS — Z79890 Hormone replacement therapy (postmenopausal): Secondary | ICD-10-CM

## 2023-04-05 DIAGNOSIS — D696 Thrombocytopenia, unspecified: Secondary | ICD-10-CM | POA: Diagnosis present

## 2023-04-05 DIAGNOSIS — K648 Other hemorrhoids: Secondary | ICD-10-CM | POA: Diagnosis present

## 2023-04-05 DIAGNOSIS — K439 Ventral hernia without obstruction or gangrene: Secondary | ICD-10-CM | POA: Diagnosis not present

## 2023-04-05 DIAGNOSIS — A09 Infectious gastroenteritis and colitis, unspecified: Secondary | ICD-10-CM | POA: Diagnosis present

## 2023-04-05 DIAGNOSIS — I951 Orthostatic hypotension: Secondary | ICD-10-CM | POA: Diagnosis present

## 2023-04-05 DIAGNOSIS — R7303 Prediabetes: Secondary | ICD-10-CM | POA: Diagnosis present

## 2023-04-05 DIAGNOSIS — R188 Other ascites: Secondary | ICD-10-CM | POA: Diagnosis not present

## 2023-04-05 DIAGNOSIS — R55 Syncope and collapse: Secondary | ICD-10-CM | POA: Diagnosis present

## 2023-04-05 DIAGNOSIS — Z882 Allergy status to sulfonamides status: Secondary | ICD-10-CM

## 2023-04-05 DIAGNOSIS — M419 Scoliosis, unspecified: Secondary | ICD-10-CM | POA: Diagnosis present

## 2023-04-05 DIAGNOSIS — E785 Hyperlipidemia, unspecified: Secondary | ICD-10-CM | POA: Diagnosis present

## 2023-04-05 DIAGNOSIS — Z885 Allergy status to narcotic agent status: Secondary | ICD-10-CM

## 2023-04-05 DIAGNOSIS — R001 Bradycardia, unspecified: Secondary | ICD-10-CM | POA: Diagnosis not present

## 2023-04-05 DIAGNOSIS — Z803 Family history of malignant neoplasm of breast: Secondary | ICD-10-CM

## 2023-04-05 DIAGNOSIS — K529 Noninfective gastroenteritis and colitis, unspecified: Secondary | ICD-10-CM | POA: Diagnosis not present

## 2023-04-05 DIAGNOSIS — K449 Diaphragmatic hernia without obstruction or gangrene: Secondary | ICD-10-CM | POA: Diagnosis not present

## 2023-04-05 DIAGNOSIS — K626 Ulcer of anus and rectum: Secondary | ICD-10-CM | POA: Diagnosis present

## 2023-04-05 DIAGNOSIS — K559 Vascular disorder of intestine, unspecified: Secondary | ICD-10-CM | POA: Diagnosis present

## 2023-04-05 DIAGNOSIS — K5730 Diverticulosis of large intestine without perforation or abscess without bleeding: Secondary | ICD-10-CM | POA: Diagnosis present

## 2023-04-05 DIAGNOSIS — M1990 Unspecified osteoarthritis, unspecified site: Secondary | ICD-10-CM | POA: Diagnosis present

## 2023-04-05 DIAGNOSIS — I119 Hypertensive heart disease without heart failure: Secondary | ICD-10-CM | POA: Diagnosis present

## 2023-04-05 DIAGNOSIS — M3500 Sicca syndrome, unspecified: Secondary | ICD-10-CM | POA: Diagnosis present

## 2023-04-05 DIAGNOSIS — K6289 Other specified diseases of anus and rectum: Secondary | ICD-10-CM | POA: Diagnosis not present

## 2023-04-05 DIAGNOSIS — H409 Unspecified glaucoma: Secondary | ICD-10-CM | POA: Diagnosis present

## 2023-04-05 DIAGNOSIS — Z888 Allergy status to other drugs, medicaments and biological substances status: Secondary | ICD-10-CM

## 2023-04-05 DIAGNOSIS — R531 Weakness: Secondary | ICD-10-CM | POA: Diagnosis not present

## 2023-04-05 DIAGNOSIS — Z800 Family history of malignant neoplasm of digestive organs: Secondary | ICD-10-CM | POA: Diagnosis not present

## 2023-04-05 DIAGNOSIS — E039 Hypothyroidism, unspecified: Secondary | ICD-10-CM | POA: Diagnosis present

## 2023-04-05 DIAGNOSIS — Z833 Family history of diabetes mellitus: Secondary | ICD-10-CM | POA: Diagnosis not present

## 2023-04-05 DIAGNOSIS — I700 Atherosclerosis of aorta: Secondary | ICD-10-CM | POA: Diagnosis not present

## 2023-04-05 DIAGNOSIS — A419 Sepsis, unspecified organism: Principal | ICD-10-CM | POA: Diagnosis present

## 2023-04-05 DIAGNOSIS — I517 Cardiomegaly: Secondary | ICD-10-CM | POA: Diagnosis not present

## 2023-04-05 DIAGNOSIS — Z79899 Other long term (current) drug therapy: Secondary | ICD-10-CM

## 2023-04-05 DIAGNOSIS — R1013 Epigastric pain: Secondary | ICD-10-CM

## 2023-04-05 DIAGNOSIS — M4316 Spondylolisthesis, lumbar region: Secondary | ICD-10-CM | POA: Diagnosis present

## 2023-04-05 DIAGNOSIS — Z801 Family history of malignant neoplasm of trachea, bronchus and lung: Secondary | ICD-10-CM

## 2023-04-05 LAB — BASIC METABOLIC PANEL
Anion gap: 9 (ref 5–15)
BUN: 13 mg/dL (ref 8–23)
CO2: 26 mmol/L (ref 22–32)
Calcium: 9.5 mg/dL (ref 8.9–10.3)
Chloride: 101 mmol/L (ref 98–111)
Creatinine, Ser: 0.79 mg/dL (ref 0.44–1.00)
GFR, Estimated: >60 mL/min (ref >60)
Glucose, Bld: 137 mg/dL — ABNORMAL HIGH (ref 70–99)
Potassium: 4.2 mmol/L (ref 3.5–5.1)
Sodium: 136 mmol/L (ref 135–145)

## 2023-04-05 LAB — URINALYSIS, ROUTINE W REFLEX MICROSCOPIC
Bilirubin Urine: NEGATIVE
Glucose, UA: NEGATIVE mg/dL
Hgb urine dipstick: NEGATIVE
Ketones, ur: NEGATIVE mg/dL
Leukocytes,Ua: NEGATIVE
Nitrite: NEGATIVE
Protein, ur: NEGATIVE mg/dL
Specific Gravity, Urine: 1.015 (ref 1.005–1.030)
pH: 6 (ref 5.0–8.0)

## 2023-04-05 LAB — HEPATIC FUNCTION PANEL
ALT: 13 U/L (ref 0–44)
AST: 24 U/L (ref 15–41)
Albumin: 3.4 g/dL — ABNORMAL LOW (ref 3.5–5.0)
Alkaline Phosphatase: 60 U/L (ref 38–126)
Bilirubin, Direct: 0.2 mg/dL (ref 0.0–0.2)
Indirect Bilirubin: 0.5 mg/dL (ref 0.3–0.9)
Total Bilirubin: 0.7 mg/dL (ref 0.0–1.2)
Total Protein: 7 g/dL (ref 6.5–8.1)

## 2023-04-05 LAB — CBC WITH DIFFERENTIAL/PLATELET
Abs Immature Granulocytes: 0.02 10*3/uL (ref 0.00–0.07)
Basophils Absolute: 0 10*3/uL (ref 0.0–0.1)
Basophils Relative: 0 %
Eosinophils Absolute: 0.1 10*3/uL (ref 0.0–0.5)
Eosinophils Relative: 1 %
HCT: 39.6 % (ref 36.0–46.0)
Hemoglobin: 13.1 g/dL (ref 12.0–15.0)
Immature Granulocytes: 0 %
Lymphocytes Relative: 20 %
Lymphs Abs: 1.3 10*3/uL (ref 0.7–4.0)
MCH: 32 pg (ref 26.0–34.0)
MCHC: 33.1 g/dL (ref 30.0–36.0)
MCV: 96.8 fL (ref 80.0–100.0)
Monocytes Absolute: 0.6 10*3/uL (ref 0.1–1.0)
Monocytes Relative: 9 %
Neutro Abs: 4.7 10*3/uL (ref 1.7–7.7)
Neutrophils Relative %: 70 %
Platelets: 123 10*3/uL — ABNORMAL LOW (ref 150–400)
RBC: 4.09 MIL/uL (ref 3.87–5.11)
RDW: 12.5 % (ref 11.5–15.5)
WBC: 6.7 10*3/uL (ref 4.0–10.5)
nRBC: 0 % (ref 0.0–0.2)

## 2023-04-05 LAB — TROPONIN I (HIGH SENSITIVITY)
Troponin I (High Sensitivity): 4 ng/L (ref <18)
Troponin I (High Sensitivity): 4 ng/L (ref <18)

## 2023-04-05 LAB — LIPASE, BLOOD: Lipase: 28 U/L (ref 11–51)

## 2023-04-05 LAB — I-STAT CG4 LACTIC ACID, ED: Lactic Acid, Venous: 1.5 mmol/L (ref 0.5–1.9)

## 2023-04-05 MED ORDER — LACTATED RINGERS IV SOLN: INTRAVENOUS | Status: AC

## 2023-04-05 MED ORDER — IRBESARTAN 150 MG PO TABS
150.0000 mg | ORAL_TABLET | Freq: Every day | ORAL | Status: DC
Start: 2023-04-06 — End: 2023-04-09
  Administered 2023-04-06 – 2023-04-09 (×4): 150 mg via ORAL
  Filled 2023-04-05 (×4): qty 1

## 2023-04-05 MED ORDER — DICYCLOMINE HCL 10 MG PO CAPS
10.0000 mg | ORAL_CAPSULE | Freq: Three times a day (TID) | ORAL | Status: DC
  Administered 2023-04-06 – 2023-04-09 (×9): 10 mg via ORAL
  Filled 2023-04-05 (×9): qty 1

## 2023-04-05 MED ORDER — GABAPENTIN 100 MG PO CAPS
100.0000 mg | ORAL_CAPSULE | Freq: Every day | ORAL | Status: DC
  Administered 2023-04-06 – 2023-04-08 (×4): 100 mg via ORAL
  Filled 2023-04-05 (×4): qty 1

## 2023-04-05 MED ORDER — ACETAMINOPHEN 325 MG PO TABS: 650.0000 mg | ORAL_TABLET | Freq: Four times a day (QID) | ORAL | Status: DC | PRN

## 2023-04-05 MED ORDER — PIPERACILLIN-TAZOBACTAM 3.375 G IVPB
3.3750 g | Freq: Three times a day (TID) | INTRAVENOUS | Status: DC
Start: 2023-04-06 — End: 2023-04-08
  Administered 2023-04-06 – 2023-04-08 (×8): 3.375 g via INTRAVENOUS
  Filled 2023-04-05 (×8): qty 50

## 2023-04-05 MED ORDER — IOHEXOL 350 MG/ML SOLN
75.0000 mL | Freq: Once | INTRAVENOUS | Status: AC | PRN
  Administered 2023-04-05: 75 mL via INTRAVENOUS

## 2023-04-05 MED ORDER — LEVOTHYROXINE SODIUM 100 MCG PO TABS
100.0000 ug | ORAL_TABLET | Freq: Every day | ORAL | Status: DC
  Administered 2023-04-06 – 2023-04-09 (×4): 100 ug via ORAL
  Filled 2023-04-05 (×3): qty 1

## 2023-04-05 MED ORDER — ROSUVASTATIN CALCIUM 5 MG PO TABS
10.0000 mg | ORAL_TABLET | Freq: Every day | ORAL | Status: DC
  Administered 2023-04-06 – 2023-04-08 (×4): 10 mg via ORAL
  Filled 2023-04-05 (×4): qty 2

## 2023-04-05 MED ORDER — ONDANSETRON HCL 4 MG/2ML IJ SOLN: 4.0000 mg | Freq: Four times a day (QID) | INTRAMUSCULAR | Status: DC | PRN

## 2023-04-05 MED ORDER — GABAPENTIN 300 MG PO CAPS
300.0000 mg | ORAL_CAPSULE | Freq: Three times a day (TID) | ORAL | Status: DC
  Administered 2023-04-06 – 2023-04-09 (×10): 300 mg via ORAL
  Filled 2023-04-05 (×10): qty 1

## 2023-04-05 MED ORDER — POLYVINYL ALCOHOL 1.4 % OP SOLN: Freq: Two times a day (BID) | OPHTHALMIC | Status: DC | PRN

## 2023-04-05 MED ORDER — MIRABEGRON ER 25 MG PO TB24
25.0000 mg | ORAL_TABLET | Freq: Every day | ORAL | Status: DC
  Administered 2023-04-07: 25 mg via ORAL
  Filled 2023-04-05 (×4): qty 1

## 2023-04-05 MED ORDER — PIPERACILLIN-TAZOBACTAM 3.375 G IVPB 30 MIN
3.3750 g | Freq: Once | INTRAVENOUS | Status: AC
  Administered 2023-04-05: 3.375 g via INTRAVENOUS
  Filled 2023-04-05: qty 50

## 2023-04-05 MED ORDER — FIDAXOMICIN 200 MG PO TABS
200.0000 mg | ORAL_TABLET | Freq: Two times a day (BID) | ORAL | 0 refills | Status: AC
  Filled 2023-04-05: qty 20, 10d supply, fill #0

## 2023-04-05 MED ORDER — ACETAMINOPHEN 500 MG PO TABS
1000.0000 mg | ORAL_TABLET | Freq: Once | ORAL | Status: AC
Start: 2023-04-05 — End: 2023-04-05
  Administered 2023-04-05: 1000 mg via ORAL
  Filled 2023-04-05: qty 2

## 2023-04-05 MED ORDER — FIDAXOMICIN 200 MG PO TABS: 200.0000 mg | ORAL_TABLET | Freq: Two times a day (BID) | ORAL | 0 refills | Status: DC

## 2023-04-05 MED ORDER — ONDANSETRON HCL 4 MG PO TABS: 4.0000 mg | ORAL_TABLET | Freq: Four times a day (QID) | ORAL | Status: DC | PRN

## 2023-04-05 MED ORDER — CEFEPIME HCL 2 G IV SOLR: 2.0000 g | Freq: Once | INTRAVENOUS | Status: DC

## 2023-04-05 MED ORDER — ENOXAPARIN SODIUM 40 MG/0.4ML IJ SOSY
40.0000 mg | PREFILLED_SYRINGE | INTRAMUSCULAR | Status: DC
  Administered 2023-04-06 – 2023-04-09 (×4): 40 mg via SUBCUTANEOUS
  Filled 2023-04-05 (×4): qty 0.4

## 2023-04-05 MED ORDER — BRIMONIDINE TARTRATE-TIMOLOL 0.2-0.5 % OP SOLN: 1.0000 [drp] | Freq: Two times a day (BID) | OPHTHALMIC | Status: DC

## 2023-04-05 MED ORDER — PANCRELIPASE (LIP-PROT-AMYL) 36000-114000 UNITS PO CPEP
72000.0000 [IU] | ORAL_CAPSULE | Freq: Three times a day (TID) | ORAL | Status: DC
  Administered 2023-04-06 – 2023-04-09 (×9): 72000 [IU] via ORAL
  Filled 2023-04-05 (×10): qty 2

## 2023-04-05 MED ORDER — ACETAMINOPHEN 650 MG RE SUPP: 650.0000 mg | Freq: Four times a day (QID) | RECTAL | Status: DC | PRN

## 2023-04-05 MED ORDER — VANCOMYCIN HCL IN DEXTROSE 1-5 GM/200ML-% IV SOLN: 1000.0000 mg | Freq: Once | INTRAVENOUS | Status: DC

## 2023-04-05 MED ORDER — VANCOMYCIN HCL 1.5 G IV SOLR
1500.0000 mg | Freq: Once | INTRAVENOUS | Status: DC
  Filled 2023-04-05: qty 30

## 2023-04-05 MED ORDER — METRONIDAZOLE 500 MG/100ML IV SOLN: 500.0000 mg | Freq: Once | INTRAVENOUS | Status: DC

## 2023-04-05 NOTE — ED Notes (Signed)
 1st lac normal, 2nd not needed can be discontinued if Dr allows

## 2023-04-05 NOTE — ED Provider Notes (Signed)
78 year old female presenting for episode of presyncope.  Few hours ago she was at a crafting center.  She developed abdominal pain and then felt somewhat dizzy, presyncopal and diaphoretic.  She shortly after that had a bowel movement with some loose stool without blood or melena and her symptoms have resolved.  She no longer feels dizzy, does not have any abdominal pain.  No chest pain or shortness of breath throughout.  No fevers or chills.  Has not had any headache or vision changes or weakness or numbness.  She has otherwise been at her baseline health.  EKG without evidence of acute ischemia.  Will obtain troponin for evaluation possible ACS.  However overall her episode of presyncope was in the context of abdominal pain and episode of diarrhea.  I am most suspicious for likely vagal episode related to abdominal pain.  Obtain CT of the abdomen for evaluation of cause although exam here is reassuring.  CT scan and lab workup pending at end of shift.   Laurence Spates, MD 04/06/23 [id number]

## 2023-04-05 NOTE — ED Triage Notes (Signed)
 Pt present to ED from crafting center d/t near syncopal episode while sitting at table. Per EMS, pt noted pale, diaphoretic, with unsteady gait at the time. Pt states hx of same with no treatment involve. Pt A&Ox4 at this time. Pt received zofran  4mg  IV.  EMS VS Bp-160/98 HR-58 Cbg-114

## 2023-04-05 NOTE — ED Provider Notes (Signed)
[place] EMERGENCY DEPARTMENT AT [facility] Provider Note   CSN: [id number] Arrival date & time: 04/05/23  1154     History  Chief Complaint  Patient presents with   Near Syncope    Tracy Hunt is a 78 y.o. female presents with pre-syncope. She has a history of pancreatic insufficiency, c-diff, diverticulitis. 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 She became pale, diaphoretic, and weak.  Patient states that she never lost consciousness and there was no seizure-like activity. She has had some nausea but has not vomited. 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 She feels tired now. She had a bowel movement while here and her abdominal pain has resolved. Denies tingling, numbness, chest pain, SOB, blood in the stool. No focal deficits. Normal food and drink this morning.    Near Syncope Associated symptoms include abdominal pain.       Home Medications Prior to Admission medications   Medication Sig Start Date End Date Taking? Authorizing Provider  acetaminophen (TYLENOL) 650 MG CR tablet Take 1,300 mg by mouth in the morning, at noon, and at bedtime. Takes 2 tablets in the morning, 2 in the afternoon, and 2 at bedtime. 03/04/19   [provider]  Cholecalciferol (VITAMIN D) 50 MCG (2000 UT) tablet Take 2,000 Units by mouth daily.    [provider]  dicyclomine (BENTYL) 10 MG capsule Take 1 capsule (10 mg total) by mouth 3 (three) times daily before meals. 07/18/22   Meryl Dare, MD  estradiol (ESTRACE) 0.1 MG/GM vaginal cream Place 0.5 g vaginally 2 (two) times a week. Place 0.5g nightly for two weeks then twice a week after 12/26/22   Marguerita Beards, MD  gabapentin (NEURONTIN) 100 MG capsule Take 1 tablet in the morning and bedtime along with 300mg  tablets. Patient taking differently: Take 100 mg by mouth at bedtime. 01/21/22    Patel, Roxana Hires K, DO  gabapentin (NEURONTIN) 300 MG capsule Take 1 capsule (300 mg total) by mouth 3 (three) times daily. 01/21/22   Nita Sickle K, DO  levothyroxine (SYNTHROID) 112 MCG tablet Take 112 mcg by mouth daily before breakfast.    [provider]  lipase/protease/amylase (CREON) 36000 UNITS CPEP capsule Take 2 capsules (72,000 Units total) by mouth 3 (three) times daily with meals. 07/08/22   Meryl Dare, MD  Menthol, Topical Analgesic, (BIOFREEZE EX) Apply 1 drop topically daily as needed (back pain).    [provider]  Multiple Vitamin (MULTI-VITAMIN) tablet Take 1 tablet by mouth daily.    [provider]  Multiple Vitamins-Minerals (CENTRUM SILVER 50+WOMEN PO) Take 1 tablet by mouth daily.    [provider]  olmesartan (BENICAR) 20 MG tablet Take 1 tablet (20 mg total) by mouth daily. 01/09/23     ondansetron (ZOFRAN) 4 MG tablet Take 1 tablet (4 mg total) by mouth every 8 (eight) hours as needed for nausea or vomiting. 11/09/22   Selmer Dominion, NP  Propylene Glycol (SYSTANE COMPLETE OP) Place 1 drop into both eyes 2 (two) times daily as needed (dry eyes).    [provider]  rosuvastatin (CRESTOR) 10 MG tablet Take 10 mg by mouth at bedtime. 04/15/20   [provider]  Vibegron 75 MG TABS Take 1 tablet (75 mg total) by mouth daily. 01/10/23   Selmer Dominion, NP  Allergies    Cosopt [dorzolamide hcl-timolol mal], Morphine, Other, and Oyster shell    Review of Systems   Review of Systems  Constitutional:  Positive for diaphoresis and fatigue.  Cardiovascular:  Positive for near-syncope.  Gastrointestinal:  Positive for abdominal pain, diarrhea and nausea.  Neurological:        Near syncope    Physical Exam Updated Vital Signs BP (!) 158/84   Pulse (!) 53   Temp 97.7 F (36.5 C) (Oral)   Resp 14   SpO2 100%  Physical Exam Vitals and nursing note reviewed.  Constitutional:      General: She is not in  acute distress.    Appearance: Normal appearance. She is well-developed.  HENT:     Head: Normocephalic and atraumatic.  Eyes:     Conjunctiva/sclera: Conjunctivae normal.     Pupils: Pupils are equal, round, and reactive to light.  Cardiovascular:     Rate and Rhythm: Normal rate and regular rhythm.     Pulses: Normal pulses.     Heart sounds: Normal heart sounds. No murmur heard. Pulmonary:     Effort: Pulmonary effort is normal. No respiratory distress.     Breath sounds: Normal breath sounds.  Abdominal:     General: Bowel sounds are normal.     Palpations: Abdomen is soft.     Tenderness: There is no abdominal tenderness.  Musculoskeletal:        General: No swelling.     Cervical back: Neck supple.  Skin:    General: Skin is warm and dry.     Capillary Refill: Capillary refill takes less than 2 seconds.  Neurological:     General: No focal deficit present.     Mental Status: She is alert.     Motor: No weakness.  Psychiatric:        Mood and Affect: Mood normal.     ED Results / Procedures / Treatments   Labs (all labs ordered are listed, but only abnormal results are displayed) Labs Reviewed  CBC WITH DIFFERENTIAL/PLATELET - Abnormal; Notable for the following components:      Result Value   Platelets 123 (*)    All other components within normal limits  BASIC METABOLIC PANEL  URINALYSIS, ROUTINE W REFLEX MICROSCOPIC  LIPASE, BLOOD  HEPATIC FUNCTION PANEL  TROPONIN I (HIGH SENSITIVITY)    EKG None  Radiology No results found.  Procedures Procedures    Medications Ordered in ED Medications - No data to display  ED Course/ Medical Decision Making/ A&P Clinical Course as of 04/05/23 1329  Wed Apr 05, 2023  1242 Recent C diff treated, had near syncope, diaphoresis [JD]    Clinical Course User Index [JD] Laurence Spates, MD                                 Medical Decision Making Amount and/or Complexity of Data Reviewed Labs:  ordered. Radiology: ordered.   This patient presents to the ED with chief complaint(s) of near syncope with pertinent past medical history of CHF, diverticulitis which further complicates the presenting complaint. The complaint involves an extensive differential diagnosis and also carries with it a high risk of complications and morbidity.    The differential diagnosis includes ACS, electrolyte abnormality, cardiac arrhythmia, vasovagal syncope, dehydration, seizure  Additional history obtained: Additional history obtained from family Records reviewed previous admission documents  ED Course and Reassessment:  Independent labs interpretation:  The following labs were independently interpreted:  CBC: Decreased platelets at 123 CMP: Decreased albumin at 3.4 Lipase: Troponin: 4, EKG: sinus rhythm  Independent visualization of imaging: - I independently visualized the following imaging with scope of interpretation limited to determining acute life threatening conditions related to emergency care:  Chest x-ray: No acute cardiopulmonary abnormality CT abdomen pelvis with contrast: Pending  Patient signed out to Arabella Merles, PA-C at shift change pending second troponin and imaging results.  If these results are reassuring patient will likely be discharge.        Final Clinical Impression(s) / ED Diagnoses Final diagnoses:  None    Rx / DC Orders ED Discharge Orders     None         Gretta Began 04/05/23 1535    Laurence Spates, MD 04/06/23 0830

## 2023-04-05 NOTE — Progress Notes (Signed)
 Pharmacy Antibiotic Note  Tracy Hunt is a 78 y.o. female admitted on 04/05/2023 with  possible ischemic colitis .  Pharmacy has been consulted for Zosyn  dosing. To receive Zosyn  30min infusion x1 in ED.  Renal function appears to be at baseline.  Plan: Zosyn  3.375g IV q8h (4 hour infusion). F/u renal function, clinical progression, and likely GI recs  Temp (24hrs), Avg:99.1 F (37.3 C), Min:97.7 F (36.5 C), Max:101.9 F (38.8 C)  Recent Labs  Lab 04/05/23 1232  WBC 6.7  CREATININE 0.79    CrCl cannot be calculated (Unknown ideal weight.).    Allergies  Allergen Reactions   Cosopt  [Dorzolamide  Hcl-Timolol  Mal] Anaphylaxis    "Very irritated"   Morphine     Makes me hyper   Other Itching    Adhesive tape    Oyster Shell Hives   Thank you for allowing pharmacy to be a part of this patient's care.  Tracy Hunt 04/05/2023 8:35 PM

## 2023-04-05 NOTE — ED Notes (Signed)
 Pt d/c from ER per initial plan with EDP, but was changed to admission after VS updated. This nurse informed provider pt d/c to home already. This nurse called pt and per pt daughter "we were really happy with the initial plan, and she told us  we were good to go." Pt daughter requesting call from provider to further discuss plan of care including admission to hospital at this time. Provider updated.

## 2023-04-05 NOTE — Sepsis Progress Note (Signed)
 Elink monitoring for the code sepsis protocol.

## 2023-04-05 NOTE — ED Notes (Signed)
 Pt brought back to ED 26, dressed out into a gown and explained plan of care.

## 2023-04-05 NOTE — ED Provider Notes (Addendum)
 Abdominal pain earlier with some sweating.  Now symptoms resolved after having a bowel movement.  Pending labs and CT. Likely vasovagal per previous team Physical Exam  BP (!) 199/103 (BP Location: Right Arm)   Pulse (!) 107   Temp (!) 101.9 F (38.8 C) (Oral)   Resp 13   Wt 73.7 kg Comment: from 01/2023  SpO2 97%   BMI 28.78 kg/m   Physical Exam Vitals and nursing note reviewed.  Constitutional:      General: She is not in acute distress.    Appearance: She is well-developed.  HENT:     Head: Normocephalic and atraumatic.  Eyes:     Conjunctiva/sclera: Conjunctivae normal.  Cardiovascular:     Rate and Rhythm: Normal rate and regular rhythm.     Heart sounds: No murmur heard. Pulmonary:     Effort: Pulmonary effort is normal. No respiratory distress.     Breath sounds: Normal breath sounds.  Abdominal:     Palpations: Abdomen is soft.     Tenderness: There is no abdominal tenderness.  Musculoskeletal:        General: No swelling.     Cervical back: Neck supple.  Skin:    General: Skin is warm and dry.     Capillary Refill: Capillary refill takes less than 2 seconds.  Neurological:     Mental Status: She is alert.  Psychiatric:        Mood and Affect: Mood normal.     Procedures  .Critical Care  Performed by: Rexie Catena, PA-C Authorized by: Rexie Catena, PA-C   Critical care provider statement:    Critical care time (minutes):  35   Critical care was necessary to treat or prevent imminent or life-threatening deterioration of the following conditions:  Sepsis   Critical care was time spent personally by me on the following activities:  Development of treatment plan with patient or surrogate, discussions with consultants, evaluation of patient's response to treatment, examination of patient, obtaining history from patient or surrogate, ordering and review of laboratory studies, ordering and review of radiographic studies, re-evaluation of patient's  condition, review of old charts and ordering and performing treatments and interventions   ED Course / MDM   Clinical Course as of 04/05/23 2215  Wed Apr 05, 2023  1242 Recent C diff treated, had near syncope, diaphoresis [JD]    Clinical Course User Index [JD] Tracy Daughters, MD   Medical Decision Making Amount and/or Complexity of Data Reviewed Labs: ordered. Radiology: ordered.  Risk OTC drugs. Prescription drug management. Decision regarding hospitalization.   Please see prior providers note for full details.  In short, patient presents with concern for an episode of abdominal pain and feeling very sweaty earlier today.  States this resolved when she had a bowel movement.  Currently feels at her baseline, although reports feeling slightly tired and weak.  No nausea, vomiting, fevers or chills.  Labs reviewed and interpreted by me which show initial and repeat troponin of 4, no chest pain or shortness of breath, EKG with normal sinus rhythm, no concern for ACS at this time.  Chest x-ray without acute abnormality.  CBC without leukocytosis.  Hepatic function panel and lipase within normal limits.  BMP within normal limits.  Urinalysis without signs of infection.  She did report history of C. difficile infection about a year ago.  Reports she took amoxicillin  prior to her dentist appointment 2 weeks ago, and then again at her appointment yesterday, but no  recent long courses of antibiotics.  No recent travel. She reports her stools have been brown and formed, no foul-smelling or green stools.  Patient was unable to provide a stool sample here today. [name] at bedside reports concern that this with how patient's C. difficile infection presented last time.    Addendum 7:50pm  Was initially anticipating discharge given her stable vitals upon arrival and unremarkable labs.  However, patient found to be febrile to 101.9 and tachycardic to 118 after repeat vitals  which is concerning for sepsis.  Likely intra-abdominal source given thickening on CT scan and patient's report of abdominal pain.  Still unable to obtain stool culture. Urine does not show any signs of infection.  Patient given Tylenol  for fever. Sepsis order set started with recommendation to start Zosyn  per Dr. Dominic Friendly with GI given possible ischemic colitis.  Patient admitted with Dr. Burley Carpenter for further medical management.    Consultations Obtained: I requested consultation with GI Dr. Dominic Friendly,  and discussed lab and imaging findings as well as pertinent plan - they recommend: Starting patient on Zosyn  given CT findings as this may be ischemic colitis.  They will plan on seeing patient in consult tomorrow during the day.  They feel this is less likely C. difficile given patient has not had a lot of diarrhea   Consultations Obtained: I requested consultation with the hospitalist Dr. Burley Carpenter,  and discussed lab and imaging findings as well as pertinent plan - they recommend: Admission for further management          Rexie Catena, PA-C 04/05/23 1842    Rexie Catena, PA-C 04/05/23 2215    Tracy Arias, MD 04/05/23 [phone]

## 2023-04-05 NOTE — ED Notes (Signed)
 Assisted pt with bedpan. Pt had a large amount of liquid stool.

## 2023-04-05 NOTE — Assessment & Plan Note (Addendum)
DDx = diverticulitis vs recurrent C.Diff vs ischemic colitis. EDP d/w Dr. Paula Compton Will see in AM C.Diff panel pending No WBC but does have fever Had 2x 1 day ABx prophylaxis prior to dental work (Dec 31st and yesterday) Had similar presentation with no WBC but diarrhea in Jan 2024, ultimately felt to be C.Diff, ultimately improved with Dificid it sounds like. Add / switch to c.diff coverage if positive Dont think that ED / pharmacy / ID / insurance will let me do empiric C.Diff coverage without a positive test. Will put pt on clear liquids.

## 2023-04-06 ENCOUNTER — Other Ambulatory Visit (HOSPITAL_COMMUNITY): Payer: Self-pay

## 2023-04-06 DIAGNOSIS — K5730 Diverticulosis of large intestine without perforation or abscess without bleeding: Secondary | ICD-10-CM | POA: Diagnosis present

## 2023-04-06 DIAGNOSIS — K626 Ulcer of anus and rectum: Secondary | ICD-10-CM | POA: Diagnosis present

## 2023-04-06 DIAGNOSIS — K529 Noninfective gastroenteritis and colitis, unspecified: Secondary | ICD-10-CM

## 2023-04-06 DIAGNOSIS — I10 Essential (primary) hypertension: Secondary | ICD-10-CM

## 2023-04-06 DIAGNOSIS — I119 Hypertensive heart disease without heart failure: Secondary | ICD-10-CM | POA: Diagnosis present

## 2023-04-06 DIAGNOSIS — E785 Hyperlipidemia, unspecified: Secondary | ICD-10-CM | POA: Diagnosis present

## 2023-04-06 DIAGNOSIS — K559 Vascular disorder of intestine, unspecified: Secondary | ICD-10-CM | POA: Diagnosis present

## 2023-04-06 DIAGNOSIS — K6289 Other specified diseases of anus and rectum: Secondary | ICD-10-CM | POA: Diagnosis not present

## 2023-04-06 DIAGNOSIS — Z96612 Presence of left artificial shoulder joint: Secondary | ICD-10-CM | POA: Diagnosis present

## 2023-04-06 DIAGNOSIS — R55 Syncope and collapse: Secondary | ICD-10-CM | POA: Diagnosis present

## 2023-04-06 DIAGNOSIS — R7303 Prediabetes: Secondary | ICD-10-CM | POA: Diagnosis present

## 2023-04-06 DIAGNOSIS — Z8249 Family history of ischemic heart disease and other diseases of the circulatory system: Secondary | ICD-10-CM | POA: Diagnosis not present

## 2023-04-06 DIAGNOSIS — A419 Sepsis, unspecified organism: Secondary | ICD-10-CM | POA: Diagnosis present

## 2023-04-06 DIAGNOSIS — M4316 Spondylolisthesis, lumbar region: Secondary | ICD-10-CM | POA: Diagnosis present

## 2023-04-06 DIAGNOSIS — K648 Other hemorrhoids: Secondary | ICD-10-CM | POA: Diagnosis present

## 2023-04-06 DIAGNOSIS — Z9049 Acquired absence of other specified parts of digestive tract: Secondary | ICD-10-CM | POA: Diagnosis not present

## 2023-04-06 DIAGNOSIS — Z833 Family history of diabetes mellitus: Secondary | ICD-10-CM | POA: Diagnosis not present

## 2023-04-06 DIAGNOSIS — R1013 Epigastric pain: Secondary | ICD-10-CM | POA: Diagnosis not present

## 2023-04-06 DIAGNOSIS — M3500 Sicca syndrome, unspecified: Secondary | ICD-10-CM | POA: Diagnosis present

## 2023-04-06 DIAGNOSIS — I951 Orthostatic hypotension: Secondary | ICD-10-CM | POA: Diagnosis present

## 2023-04-06 DIAGNOSIS — Z823 Family history of stroke: Secondary | ICD-10-CM | POA: Diagnosis not present

## 2023-04-06 DIAGNOSIS — A09 Infectious gastroenteritis and colitis, unspecified: Secondary | ICD-10-CM | POA: Diagnosis present

## 2023-04-06 DIAGNOSIS — Z79890 Hormone replacement therapy (postmenopausal): Secondary | ICD-10-CM | POA: Diagnosis not present

## 2023-04-06 DIAGNOSIS — E876 Hypokalemia: Secondary | ICD-10-CM | POA: Diagnosis not present

## 2023-04-06 DIAGNOSIS — E039 Hypothyroidism, unspecified: Secondary | ICD-10-CM | POA: Diagnosis present

## 2023-04-06 DIAGNOSIS — Z800 Family history of malignant neoplasm of digestive organs: Secondary | ICD-10-CM | POA: Diagnosis not present

## 2023-04-06 DIAGNOSIS — H409 Unspecified glaucoma: Secondary | ICD-10-CM | POA: Diagnosis present

## 2023-04-06 DIAGNOSIS — D696 Thrombocytopenia, unspecified: Secondary | ICD-10-CM | POA: Diagnosis present

## 2023-04-06 DIAGNOSIS — Z801 Family history of malignant neoplasm of trachea, bronchus and lung: Secondary | ICD-10-CM | POA: Diagnosis not present

## 2023-04-06 LAB — C DIFFICILE QUICK SCREEN W PCR REFLEX
C Diff antigen: NEGATIVE
C Diff interpretation: NOT DETECTED
C Diff toxin: NEGATIVE

## 2023-04-06 LAB — BASIC METABOLIC PANEL
Anion gap: 9 (ref 5–15)
BUN: 14 mg/dL (ref 8–23)
CO2: 25 mmol/L (ref 22–32)
Calcium: 9.3 mg/dL (ref 8.9–10.3)
Chloride: 103 mmol/L (ref 98–111)
Creatinine, Ser: 1.05 mg/dL — ABNORMAL HIGH (ref 0.44–1.00)
GFR, Estimated: 55 mL/min — ABNORMAL LOW (ref >60)
Glucose, Bld: 128 mg/dL — ABNORMAL HIGH (ref 70–99)
Potassium: 3.8 mmol/L (ref 3.5–5.1)
Sodium: 137 mmol/L (ref 135–145)

## 2023-04-06 LAB — CBC
HCT: 42.3 % (ref 36.0–46.0)
Hemoglobin: 14.2 g/dL (ref 12.0–15.0)
MCH: 32 pg (ref 26.0–34.0)
MCHC: 33.6 g/dL (ref 30.0–36.0)
MCV: 95.3 fL (ref 80.0–100.0)
Platelets: 119 10*3/uL — ABNORMAL LOW (ref 150–400)
RBC: 4.44 MIL/uL (ref 3.87–5.11)
RDW: 12.5 % (ref 11.5–15.5)
WBC: 6.5 10*3/uL (ref 4.0–10.5)
nRBC: 0 % (ref 0.0–0.2)

## 2023-04-06 LAB — TSH: TSH: 0.448 u[IU]/mL (ref 0.350–4.500)

## 2023-04-06 LAB — I-STAT CG4 LACTIC ACID, ED: Lactic Acid, Venous: 1 mmol/L (ref 0.5–1.9)

## 2023-04-06 MED ORDER — PHENOL 1.4 % MT LIQD
1.0000 | OROMUCOSAL | Status: DC | PRN
  Administered 2023-04-06: 1 via OROMUCOSAL
  Filled 2023-04-06: qty 177

## 2023-04-06 MED ORDER — TIMOLOL MALEATE 0.5 % OP SOLN
1.0000 [drp] | Freq: Two times a day (BID) | OPHTHALMIC | Status: DC
  Administered 2023-04-06 – 2023-04-09 (×6): 1 [drp] via OPHTHALMIC
  Filled 2023-04-06: qty 5

## 2023-04-06 MED ORDER — BRIMONIDINE TARTRATE 0.2 % OP SOLN
1.0000 [drp] | Freq: Two times a day (BID) | OPHTHALMIC | Status: DC
  Administered 2023-04-07 – 2023-04-09 (×6): 1 [drp] via OPHTHALMIC
  Filled 2023-04-06 (×2): qty 5

## 2023-04-06 MED ORDER — VANCOMYCIN HCL 125 MG PO CAPS
125.0000 mg | ORAL_CAPSULE | Freq: Every day | ORAL | Status: DC
  Administered 2023-04-06 – 2023-04-08 (×3): 125 mg via ORAL
  Filled 2023-04-06 (×3): qty 1

## 2023-04-06 NOTE — H&P (Signed)
History and Physical    Patient: Tracy Hunt DOB: [birth year] DOA: 04/05/2023 DOS: the patient was seen and examined on 04/06/2023 PCP: Chilton Greathouse, MD  Patient coming from: Home  Chief Complaint:  Chief Complaint  Patient presents with   Near Syncope   HPI: Tracy Hunt is a 78 y.o. female with medical history significant of diverticulitis, C.Diff diarrhea in Jan 2024 treated with Dificid, HTN, HLD.  Pt in to ED after near sycopal episode this AM.  Associated with onset of diarrhea.  Pt had 2x 1 day prophylactic ABx recently: one on 12/31 prior to dental procedure and 2nd one prior to dental cleaning yesterday.  Diarrhea onset this AM.  Associated abd pain.  Now resolved.    Review of Systems: As mentioned in the history of present illness. All other systems reviewed and are negative. Past Medical History:  Diagnosis Date   Acid reflux    Anemia    Arthritis    Bell's palsy 1988   C. difficile diarrhea 03/2022   Complication of anesthesia    Hard to wake up after a  colonoscopy before  around 2000   Diverticulitis    Dry eye    Family history of adverse reaction to anesthesia    mom hard to wake up and had lethargy after anesthesia   Glaucoma    Gout    Hepatitis 1986   secondary to varicella was in hospital for 2 days   Hiatal hernia    Hyperlipemia    Hypertension    Hypothyroidism    Malaria    as as child   Mumps    Pre-diabetes    Rosacea    Scoliosis    Sjogren's syndrome (HCC)    Past Surgical History:  Procedure Laterality Date   ABDOMINAL HYSTERECTOMY  1995   APPENDECTOMY  1995   CATARACT EXTRACTION Bilateral    CHOLECYSTECTOMY  2013   COLPOCLEISIS N/A 12/05/2022   Procedure: COLPOCLEISIS;  Surgeon: Marguerita Beards, MD;  Location: [facility];  Service: Gynecology;  Laterality: N/A;  Total time requested is 2 hours   CYSTOSCOPY N/A 12/05/2022   Procedure: CYSTOSCOPY;  Surgeon: Marguerita Beards, MD;  Location: [facility];  Service: Gynecology;  Laterality: N/A;   EYE SURGERY Bilateral    cataract   JOINT REPLACEMENT  2012   Left knee   JOINT REPLACEMENT     right knee 2017   lumbar radial frequency ablation  2014   L 3 to L 4 and L 5   RECTOCELE REPAIR N/A 12/05/2022   Procedure: Levator plication with perineorrhaphy;  Surgeon: Marguerita Beards, MD;  Location: [facility];  Service: Gynecology;  Laterality: N/A;   REVERSE SHOULDER ARTHROPLASTY Left 10/23/2020   Procedure: REVERSE SHOULDER ARTHROPLASTY;  Surgeon: Beverely Low, MD;  Location: [facility];  Service: Orthopedics;  Laterality: Left;  with ISB   steroid injections     L3,L4, L5   Social History:  reports that she has never smoked. She has never used smokeless tobacco. She reports that she does not currently use alcohol. She reports that she does not use drugs.  Allergies  Allergen Reactions   Cosopt [Dorzolamide Hcl-Timolol Mal] Other (See Comments)    "Very irritated"   Morphine     Makes me hyper   Other Itching    Adhesive tape  Paper tape is okay   Oyster Shell Hives, Diarrhea and Nausea  And Vomiting    Oysters - cramping    Family History  Problem Relation Age of Onset   Colon cancer Mother    Lung cancer Mother    Hypertension Mother    Diabetes Father    CVA Sister    Dementia Sister    Breast cancer Paternal Grandmother     Prior to Admission medications   Medication Sig Start Date End Date Taking? Authorizing Provider  acetaminophen (TYLENOL) 650 MG CR tablet Take 1,300 mg by mouth in the morning, at noon, and at bedtime. Takes 2 tablets in the morning, 2 in the afternoon, and 2 at bedtime. 03/04/19  Yes [provider]  Cholecalciferol (VITAMIN D) 50 MCG (2000 UT) tablet Take 2,000 Units by mouth daily.   Yes [provider]  COMBIGAN 0.2-0.5 % ophthalmic solution Place 1 drop into both eyes in the morning and at bedtime. 03/23/23   Yes [provider]  dicyclomine (BENTYL) 10 MG capsule Take 1 capsule (10 mg total) by mouth 3 (three) times daily before meals. 07/18/22  Yes Meryl Dare, MD  docusate sodium (COLACE) 100 MG capsule Take 100 mg by mouth 2 (two) times daily.   Yes [provider]  estradiol (ESTRACE) 0.1 MG/GM vaginal cream Place 0.5 g vaginally 2 (two) times a week. Place 0.5g nightly for two weeks then twice a week after 12/26/22  Yes Marguerita Beards, MD  gabapentin (NEURONTIN) 100 MG capsule Take 1 tablet in the morning and bedtime along with 300mg  tablets. Patient taking differently: Take 100 mg by mouth at bedtime. 01/21/22  Yes Patel, Donika K, DO  gabapentin (NEURONTIN) 300 MG capsule Take 1 capsule (300 mg total) by mouth 3 (three) times daily. 01/21/22  Yes Patel, Donika K, DO  levothyroxine (SYNTHROID) 100 MCG tablet Take 100 mcg by mouth daily before breakfast.   Yes [provider]  lipase/protease/amylase (CREON) 36000 UNITS CPEP capsule Take 2 capsules (72,000 Units total) by mouth 3 (three) times daily with meals. 07/08/22  Yes Meryl Dare, MD  Menthol, Topical Analgesic, (BIOFREEZE EX) Apply 1 drop topically daily as needed (back pain).   Yes [provider]  Multiple Vitamins-Minerals (CENTRUM SILVER 50+WOMEN PO) Take 1 tablet by mouth daily.   Yes [provider]  olmesartan (BENICAR) 20 MG tablet Take 1 tablet (20 mg total) by mouth daily. 01/09/23  Yes   ondansetron (ZOFRAN) 4 MG tablet Take 1 tablet (4 mg total) by mouth every 8 (eight) hours as needed for nausea or vomiting. 11/09/22  Yes Zuleta, Joan Mayans, NP  Propylene Glycol (SYSTANE COMPLETE OP) Place 1 drop into both eyes 2 (two) times daily as needed (dry eyes).   Yes [provider]  rosuvastatin (CRESTOR) 10 MG tablet Take 10 mg by mouth at bedtime. 04/15/20  Yes [provider]  Vibegron 75 MG TABS Take 1 tablet (75 mg total) by mouth daily. 01/10/23  Yes Selmer Dominion, NP  fidaxomicin (DIFICID) 200 MG TABS tablet Take 1 tablet (200 mg total) by mouth 2 (two) times daily for 10 days. 04/05/23 04/15/23  Arabella Merles, PA-C    Physical Exam: Vitals:   04/06/23 0030 04/06/23 0045 04/06/23 0100 04/06/23 0101  BP:   (!) 87/54   Pulse: 81 81 76   Resp: 19 17 14    Temp:    98.8 F (37.1 C)  TempSrc:    Oral  SpO2: 100% 97% 97%   Weight:  Constitutional: NAD, calm, comfortable Respiratory: clear to auscultation bilaterally, no wheezing, no crackles. Normal respiratory effort. No accessory muscle use.  Cardiovascular: Regular rate and rhythm, no murmurs / rubs / gallops. No extremity edema. 2+ pedal pulses. No carotid bruits.  Abdomen: no tenderness, no masses palpated. No hepatosplenomegaly. Bowel sounds positive.  Neurologic: CN 2-12 grossly intact. Sensation intact, DTR normal. Strength 5/5 in all 4.  Psychiatric: Normal judgment and insight. Alert and oriented x 3. Normal mood.   Data Reviewed:    Labs on Admission: I have personally reviewed following labs and imaging studies  CBC: Recent Labs  Lab 04/05/23 1232  WBC 6.7  NEUTROABS 4.7  HGB 13.1  HCT 39.6  MCV 96.8  PLT 123*   Basic Metabolic Panel: Recent Labs  Lab 04/05/23 1232  NA 136  K 4.2  CL 101  CO2 26  GLUCOSE 137*  BUN 13  CREATININE 0.79  CALCIUM 9.5   GFR: Estimated Creatinine Clearance: 56.6 mL/min (by C-G formula based on SCr of 0.79 mg/dL). Liver Function Tests: Recent Labs  Lab 04/05/23 1232  AST 24  ALT 13  ALKPHOS 60  BILITOT 0.7  PROT 7.0  ALBUMIN 3.4*   Recent Labs  Lab 04/05/23 1614  LIPASE 28   No results for input(s): "AMMONIA" in the last 168 hours. Coagulation Profile: No results for input(s): "INR", "PROTIME" in the last 168 hours. Cardiac Enzymes: No results for input(s): "CKTOTAL", "CKMB", "CKMBINDEX", "TROPONINI" in the last 168 hours. BNP (last 3 results) No results for input(s): "PROBNP" in the last 8760  hours. HbA1C: No results for input(s): "HGBA1C" in the last 72 hours. CBG: No results for input(s): "GLUCAP" in the last 168 hours. Lipid Profile: No results for input(s): "CHOL", "HDL", "LDLCALC", "TRIG", "CHOLHDL", "LDLDIRECT" in the last 72 hours. Thyroid Function Tests: No results for input(s): "TSH", "T4TOTAL", "FREET4", "T3FREE", "THYROIDAB" in the last 72 hours. Anemia Panel: No results for input(s): "VITAMINB12", "FOLATE", "FERRITIN", "TIBC", "IRON", "RETICCTPCT" in the last 72 hours. Urine analysis:    Component Value Date/Time   COLORURINE YELLOW 04/05/2023 1652   APPEARANCEUR CLEAR 04/05/2023 1652   LABSPEC 1.015 04/05/2023 1652   PHURINE 6.0 04/05/2023 1652   GLUCOSEU NEGATIVE 04/05/2023 1652   HGBUR NEGATIVE 04/05/2023 1652   BILIRUBINUR NEGATIVE 04/05/2023 1652   BILIRUBINUR negative 03/16/2022 0933   KETONESUR NEGATIVE 04/05/2023 1652   PROTEINUR NEGATIVE 04/05/2023 1652   UROBILINOGEN 1.0 03/16/2022 0933   NITRITE NEGATIVE 04/05/2023 1652   LEUKOCYTESUR NEGATIVE 04/05/2023 1652    Radiological Exams on Admission: CT ABDOMEN PELVIS W CONTRAST Result Date: 04/05/2023 CLINICAL DATA:  Near syncope, pallor and diaphoresis, unsteady gait with initial altered mental status. EXAM: CT ABDOMEN AND PELVIS WITH CONTRAST TECHNIQUE: Multidetector CT imaging of the abdomen and pelvis was performed using the standard protocol following bolus administration of intravenous contrast. RADIATION DOSE REDUCTION: This exam was performed according to the departmental dose-optimization program which includes automated exposure control, adjustment of the mA and/or kV according to patient size and/or use of iterative reconstruction technique. CONTRAST:  75mL OMNIPAQUE IOHEXOL 350 MG/ML SOLN COMPARISON:  04/16/2022 FINDINGS: Lower chest: Stable moderate cardiomegaly. Small type 1 hiatal hernia. Hepatobiliary: Cholecystectomy.  Otherwise unremarkable. Pancreas: Unremarkable Spleen: Punctate  calcifications compatible with old granulomatous disease. Adrenals/Urinary Tract: Unremarkable Stomach/Bowel: Moderate abnormal colon wall thickening in the descending colon becoming more striking/severe in the sigmoid colon and especially the rectum, with substantial perirectal stranding and notable mucosal wall thickening in the involved segment but not involving  the right colon. Transverse colon involvement is questionable due to nondistention. There is moderate sigmoid colon diverticulosis but no focal indicator of acute diverticulitis. Notable presacral edema accompanies the perirectal stranding and edema and there is potentially a trace amount of ascites in the pelvis. This pattern of striking wall thickening in the distal colon (particularly the sigmoid colon and rectum) has been present on some prior exams, most notably 03/31/2022. No pneumatosis is identified. No extraluminal gas or abscess. No dilated small bowel. Vascular/Lymphatic: Atherosclerosis is present, including aortoiliac atherosclerotic disease. There is some mild atherosclerotic plaque at the origins of the celiac trunk and SMA without high-grade stenosis there appears to be atheromatous plaque near the origin of the inferior mesenteric artery for example on image 41 series 2 although the IMA appears to opacify suggesting that it is not overtly occluded. The Hunt vein, SMV, and inferior mesenteric vein appear patent. Reproductive: Uterus absent.  No adnexal abnormality. Other: No supplemental non-categorized findings. Musculoskeletal: Right upper quadrant anterior abdominal wall hernia contains adipose tissue, herniated tissue measures 1.7 by 5.4 by 4.5 cm (volume = 22 cm^3), hernia neck measures 2.7 by 2.7 cm. No herniated bowel. Improvement in prior pelvic floor laxity status post levator plication in September 2024. Similar pattern of S shaped thoracolumbar scoliosis compared to prior exams, with associated grade 1 anterolisthesis at L4-5.  Spondylosis and degenerative disc disease result in right foraminal impingement at L1-2, L2-3, and L5-S1; and left foraminal impingement at L3-4, L4-5, and especially L5-S1. IMPRESSION: 1. Moderate abnormal colon wall thickening in the descending colon with striking abnormal wall thickening in the sigmoid colon and rectum, with substantial perirectal stranding and edema. This pattern of wall thickening has been present on some prior exams, most notably the CT scan 03/31/2022. Although there is some atherosclerosis including plaque at the origin of the inferior mesenteric artery, the IMA is not overtly occluded or narrowed beyond this area of plaque, in the inferior mesenteric vein appears patent. It is conceivable that a hypotensive episode might reduce flow in the IMA enough to cause a transient ischemia that could lead to this distribution of inflammation, although this is speculative and there are no specific indicators of ischemia such as pneumatosis or Hunt venous gas. Other possibilities include recurrent infectious colitis; inflammatory bowel disease; or pseudomembranous colitis if the patient has been on recent antibiotic therapy. 2. Inflammatory findings in the perirectal region are associated with substantial presacral edema and potentially a trace amount of pelvic ascites. 3. Moderate sigmoid colon diverticulosis without focal indicator of acute diverticulitis. 4. Stable moderate cardiomegaly. 5. Small type 1 hiatal hernia. 6. Chronic right upper quadrant anterior abdominal wall hernia contains adipose tissue. 7. Improvement in prior pelvic floor laxity status post levator plication in September 2024. 8. S-shaped thoracolumbar scoliosis with associated grade 1 anterolisthesis at L4-5. Spondylosis and degenerative disc disease result in right foraminal impingement at L1-2, L2-3, and L5-S1; and left foraminal impingement at L3-4, L4-5, and especially L5-S1. 9. Aortic atherosclerosis. Aortic  Atherosclerosis (ICD10-I70.0). Electronically Signed   By: Gaylyn Rong M.D.   On: 04/05/2023 16:53   DG Chest 2 View Result Date: 04/05/2023 CLINICAL DATA:  Syncope EXAM: CHEST - 2 VIEW COMPARISON:  None Available. FINDINGS: Left shoulder reef placement. Mild cardiomegaly. No acute airspace disease, pleural effusion or pneumothorax. Aortic atherosclerosis. Scoliosis of the spine IMPRESSION: 1. Cardiomegaly. 2. No radiographic evidence for acute cardiopulmonary abnormality Electronically Signed   By: Jasmine Pang M.D.   On: 04/05/2023 15:09  EKG: Independently reviewed.   Assessment and Plan: * Colitis DDx = diverticulitis vs recurrent C.Diff vs ischemic colitis. EDP d/w Dr. Paula Compton Will see in AM C.Diff panel pending No WBC but does have fever Had 2x 1 day ABx prophylaxis prior to dental work (Dec 31st and yesterday) Had similar presentation with no WBC but diarrhea in Jan 2024, ultimately felt to be C.Diff, ultimately improved with Dificid it sounds like. Add / switch to c.diff coverage if positive Dont think that ED / pharmacy / ID / insurance will let me do empiric C.Diff coverage without a positive test. Will put pt on clear liquids.  HTN (hypertension) Despite concern of a hypotensive episode leading to IMA under perfusion.  Doesn't look like we are having any hypotensive episodes here in ED today.  On the contrary her BP remains persistently high (199/101 most recent). Cont home BP meds.      Advance Care Planning:   Code Status: Full Code  Consults: GI Dr. Myrtie Neither  Family Communication: No family in room  Severity of Illness: The appropriate patient status for this patient is OBSERVATION. Observation status is judged to be reasonable and necessary in order to provide the required intensity of service to ensure the patient's safety. The patient's presenting symptoms, physical exam findings, and initial radiographic and laboratory data in the context of their  medical condition is felt to place them at decreased risk for further clinical deterioration. Furthermore, it is anticipated that the patient will be medically stable for discharge from the hospital within 2 midnights of admission.   Author: Hillary Bow., DO 04/06/2023 1:03 AM  For on call review [email or web address].

## 2023-04-06 NOTE — Consult Note (Signed)
Consultation Note   Referring Provider:  [facility] PCP: Chilton Greathouse, MD Primary Gastroenterologist:  Claudette Head, MD        Reason for Consultation: abdominal pain, bowel wall thickening on CT  DOA: 04/05/2023         Hospital Day: 2   ASSESSMENT    Brief Narrative:  78 y.o. year old female with a history of pancreatic insufficiency, IBS, diverticulitis, C. difficile colitis.  She has a family history of colon cancer in a first-degree relative at age 91  Near syncopal episode prior to admission. Etiology?  Wasn't hypotensive on arrival to ED  Abnormal colon on CT scan  . Moderate abnormal colon wall thickening in the descending colon with striking abnormal wall thickening in the sigmoid colon and rectum, with substantial perirectal stranding and edema.  Infectious? Ischemic? IBD seems unlikely This pattern of wall thickening has been present on some prior exams. She does have a history of C-diff and did recently have antibiotics but C-diff study today is negative. Consider other pathogens though she isn't really have diarrhea and WBC is normal.   Query possibility of ischemic colitis though not sure she was hypotensive yesterday  History of adenomatous colon polyps.   Small adenoma removed in March 2021.  She also has a family history of colon cancer in a first-degree relative at age 73  Chronic thrombocytopenia Platelet count stable. No splenomegaly on imaging  See PMH for additional medical history  Principal Problem:   Colitis Active Problems:   HTN (hypertension)    PLAN:   --Await GI path panel --Supportive care in the interim --Continue clear liquids --She is due for surveillance colonoscopy which will probably be done outpatient unless this clinical course necessitates it be done inpatient  HPI   Tracy Hunt presented to the ED yesterday with near syncopal episode.  Patient was at a crafting course yesterday  when she suddenly felt very weak and diaphoretic.  She subsequently developed generalized lower abdominal pain.  She was brought to the emergency department and at some point had a large soft/liquid bowel movement. .  She does not think the stools contained any blood.   She has not had any further abdominal pain or bowel movements today.  She actually feels okay. She hasn't been having any diarrhea or abdominal pain leading up to these events.   She recently had some amoxicillin for dental work.  She has a history of C. difficile colitis and was worried about C. difficile.  Her stool test for C. difficile is negative.   ED Evauation:  Febrile - 101.9. Lactic acid normal. WBC was normal. Hemoglobin normal.  She had a mild elevation of her creatinine but labs were otherwise .  However, CT scan showed colon wall thickening with stranding.  Given the pattern of wall thickening there was concern for maybe ischemic colitis given preceding hypotension.  There was also concern for recurrent C. difficile but studies are negative.  GI path panel is pending panel preliminary blood cultures are negative at less than 12 hours   EKG Interpretation Date/Time:                  Wednesday April 05 2023 11:58:26 EST Ventricular Rate:  54 PR Interval:                 192 QRS Duration:             83 QT Interval:                 431 QTC Calculation:409 R Axis:                         -32   Text Interpretation:Sinus rhythm Inferior infarct, old Consider anterior infarct No significant change since last tracing Confirmed by Fulton Reek [phone]) on 04/05/2023 2:33:33 PM  CT AP with contrast  1. Moderate abnormal colon wall thickening in the descending colon with striking abnormal wall thickening in the sigmoid colon and rectum, with substantial perirectal stranding and edema. This pattern of wall thickening has been present on some prior exams, most notably the CT scan 03/31/2022. Although there is  some atherosclerosis including plaque at the origin of the inferior mesenteric artery, the IMA is not overtly occluded or narrowed beyond this area of plaque, in the inferior mesenteric vein appears patent. It is conceivable that a hypotensive episode might reduce flow in the IMA enough to cause a transient ischemia that could lead to this distribution of inflammation, although this is speculative and there are no specific indicators of ischemia such as pneumatosis or portal venous gas. Other possibilities include recurrent infectious colitis; inflammatory bowel disease; or pseudomembranous colitis if the patient has been on recent antibiotic therapy. 2. Inflammatory findings in the perirectal region are associated with substantial presacral edema and potentially a trace amount of pelvic ascites. 3. Moderate sigmoid colon diverticulosis without focal indicator of acute diverticulitis. 4. Stable moderate cardiomegaly. 5. Small type 1 hiatal hernia. 6. Chronic right upper quadrant anterior abdominal wall hernia contains adipose tissue. 7. Improvement in prior pelvic floor laxity status post levator plication in September 2024. 8. S-shaped thoracolumbar scoliosis with associated grade 1 anterolisthesis at L4-5. Spondylosis and degenerative disc disease result in right foraminal impingement at L1-2, L2-3, and L5-S1; and left foraminal impingement at L3-4, L4-5, and especially L5-S1. 9. Aortic atherosclerosis.     Previous GI Evaluations   Colonoscopy March 2021 by Dr. Elnoria Howard -- 3 mm sessile polyp removed.  Left-sided diverticulosis Path compatible with a tubular adenoma  Labs and Imaging: Recent Labs    04/05/23 1232 04/06/23 0305  WBC 6.7 6.5  HGB 13.1 14.2  HCT 39.6 42.3  PLT 123* 119*   Recent Labs    04/05/23 1232 04/06/23 0305  NA 136 137  K 4.2 3.8  CL 101 103  CO2 26 25  GLUCOSE 137* 128*  BUN 13 14  CREATININE 0.79 1.05*  CALCIUM 9.5 9.3   Recent Labs     04/05/23 1232  PROT 7.0  ALBUMIN 3.4*  AST 24  ALT 13  ALKPHOS 60  BILITOT 0.7  BILIDIR 0.2  IBILI 0.5   No results for input(s): "HEPBSAG", "HCVAB", "HEPAIGM", "HEPBIGM" in the last 72 hours. No results for input(s): "LABPROT", "INR" in the last 72 hours.    Past Medical History:  Diagnosis Date   Acid reflux    Anemia    Arthritis    Bell's palsy 1988   C. difficile diarrhea 03/2022   Complication of anesthesia    Hard to wake up after a  colonoscopy before  around 2000   Diverticulitis    Dry eye    Family history of adverse  reaction to anesthesia    mom hard to wake up and had lethargy after anesthesia   Glaucoma    Gout    Hepatitis 1986   secondary to varicella was in hospital for 2 days   Hiatal hernia    Hyperlipemia    Hypertension    Hypothyroidism    Malaria    as as child   Mumps    Pre-diabetes    Rosacea    Scoliosis    Sjogren's syndrome [facility])     Past Surgical History:  Procedure Laterality Date   ABDOMINAL HYSTERECTOMY  1995   APPENDECTOMY  1995   CATARACT EXTRACTION Bilateral    CHOLECYSTECTOMY  2013   COLPOCLEISIS N/A 12/05/2022   Procedure: COLPOCLEISIS;  Surgeon: Marguerita Beards, MD;  Location: [facility];  Service: Gynecology;  Laterality: N/A;  Total time requested is 2 hours   CYSTOSCOPY N/A 12/05/2022   Procedure: CYSTOSCOPY;  Surgeon: Marguerita Beards, MD;  Location: [facility];  Service: Gynecology;  Laterality: N/A;   EYE SURGERY Bilateral    cataract   JOINT REPLACEMENT  2012   Left knee   JOINT REPLACEMENT     right knee 2017   lumbar radial frequency ablation  2014   L 3 to L 4 and L 5   RECTOCELE REPAIR N/A 12/05/2022   Procedure: Levator plication with perineorrhaphy;  Surgeon: Marguerita Beards, MD;  Location: [facility];  Service: Gynecology;  Laterality: N/A;   REVERSE SHOULDER ARTHROPLASTY Left 10/23/2020   Procedure: REVERSE SHOULDER ARTHROPLASTY;   Surgeon: Beverely Low, MD;  Location: [facility];  Service: Orthopedics;  Laterality: Left;  with ISB   steroid injections     L3,L4, L5    Family History  Problem Relation Age of Onset   Colon cancer Mother    Lung cancer Mother    Hypertension Mother    Diabetes Father    CVA Sister    Dementia Sister    Breast cancer Paternal Grandmother     Prior to Admission medications   Medication Sig Start Date End Date Taking? Authorizing Provider  acetaminophen (TYLENOL) 650 MG CR tablet Take 1,300 mg by mouth in the morning, at noon, and at bedtime. Takes 2 tablets in the morning, 2 in the afternoon, and 2 at bedtime. 03/04/19  Yes [provider]  Cholecalciferol (VITAMIN D) 50 MCG (2000 UT) tablet Take 2,000 Units by mouth daily.   Yes [provider]  COMBIGAN 0.2-0.5 % ophthalmic solution Place 1 drop into both eyes in the morning and at bedtime. 03/23/23  Yes [provider]  dicyclomine (BENTYL) 10 MG capsule Take 1 capsule (10 mg total) by mouth 3 (three) times daily before meals. 07/18/22  Yes Meryl Dare, MD  docusate sodium (COLACE) 100 MG capsule Take 100 mg by mouth 2 (two) times daily.   Yes [provider]  estradiol (ESTRACE) 0.1 MG/GM vaginal cream Place 0.5 g vaginally 2 (two) times a week. Place 0.5g nightly for two weeks then twice a week after 12/26/22  Yes Marguerita Beards, MD  gabapentin (NEURONTIN) 100 MG capsule Take 1 tablet in the morning and bedtime along with 300mg  tablets. Patient taking differently: Take 100 mg by mouth at bedtime. 01/21/22  Yes Patel, Donika K, DO  gabapentin (NEURONTIN) 300 MG capsule Take 1 capsule (300 mg total) by mouth 3 (three) times daily. 01/21/22  Yes Patel, Donika K, DO  levothyroxine (SYNTHROID)  100 MCG tablet Take 100 mcg by mouth daily before breakfast.   Yes [provider]  lipase/protease/amylase (CREON) 36000 UNITS CPEP capsule Take 2 capsules (72,000 Units total) by mouth 3 (three)  times daily with meals. 07/08/22  Yes Meryl Dare, MD  Menthol, Topical Analgesic, (BIOFREEZE EX) Apply 1 drop topically daily as needed (back pain).   Yes [provider]  Multiple Vitamins-Minerals (CENTRUM SILVER 50+WOMEN PO) Take 1 tablet by mouth daily.   Yes [provider]  olmesartan (BENICAR) 20 MG tablet Take 1 tablet (20 mg total) by mouth daily. 01/09/23  Yes   ondansetron (ZOFRAN) 4 MG tablet Take 1 tablet (4 mg total) by mouth every 8 (eight) hours as needed for nausea or vomiting. 11/09/22  Yes Zuleta, Joan Mayans, NP  Propylene Glycol (SYSTANE COMPLETE OP) Place 1 drop into both eyes 2 (two) times daily as needed (dry eyes).   Yes [provider]  rosuvastatin (CRESTOR) 10 MG tablet Take 10 mg by mouth at bedtime. 04/15/20  Yes [provider]  Vibegron 75 MG TABS Take 1 tablet (75 mg total) by mouth daily. 01/10/23  Yes Selmer Dominion, NP  fidaxomicin (DIFICID) 200 MG TABS tablet Take 1 tablet (200 mg total) by mouth 2 (two) times daily for 10 days. 04/05/23 04/15/23  Arabella Merles, PA-C    Current Facility-Administered Medications  Medication Dose Route Frequency Provider Last Rate Last Admin   acetaminophen (TYLENOL) tablet 650 mg  650 mg Oral Q6H PRN Hillary Bow, DO       Or   acetaminophen (TYLENOL) suppository 650 mg  650 mg Rectal Q6H PRN Hillary Bow, DO       brimonidine-timolol (COMBIGAN) 0.2-0.5 % ophthalmic solution 1 drop  1 drop Both Eyes BID Lyda Perone M, DO       dicyclomine (BENTYL) capsule 10 mg  10 mg Oral TID Leeann Must, DO   10 mg at 04/06/23 0819   enoxaparin (LOVENOX) injection 40 mg  40 mg Subcutaneous Q24H Lyda Perone M, DO       gabapentin (NEURONTIN) capsule 100 mg  100 mg Oral QHS Lyda Perone M, DO   100 mg at 04/06/23 0144   gabapentin (NEURONTIN) capsule 300 mg  300 mg Oral TID Hillary Bow, DO   300 mg at 04/06/23 0144   irbesartan (AVAPRO) tablet 150 mg  150 mg Oral Daily  Lyda Perone M, DO       lactated ringers infusion   Intravenous Continuous Hillary Bow, DO 75 mL/hr at 04/06/23 0144 New Bag at 04/06/23 0144   levothyroxine (SYNTHROID) tablet 100 mcg  100 mcg Oral QAC breakfast Hillary Bow, DO   100 mcg at 04/06/23 1610   lipase/protease/amylase (CREON) capsule 72,000 Units  72,000 Units Oral TID WC Hillary Bow, DO       mirabegron ER [facility]) tablet 25 mg  25 mg Oral Daily Lyda Perone M, DO       ondansetron [facility]) tablet 4 mg  4 mg Oral Q6H PRN Hillary Bow, DO       Or   ondansetron [facility]) injection 4 mg  4 mg Intravenous Q6H PRN Hillary Bow, DO       piperacillin-tazobactam (ZOSYN) IVPB 3.375 g  3.375 g Intravenous Q8H Jenita Seashore, [place]   Stopped at 04/06/23 9604   polyvinyl alcohol (LIQUIFILM TEARS) 1.4 % ophthalmic solution   Both Eyes BID PRN Lyda Perone  M, DO       rosuvastatin (CRESTOR) tablet 10 mg  10 mg Oral QHS Lyda Perone M, DO   10 mg at 04/06/23 0144   Current Outpatient Medications  Medication Sig Dispense Refill   acetaminophen (TYLENOL) 650 MG CR tablet Take 1,300 mg by mouth in the morning, at noon, and at bedtime. Takes 2 tablets in the morning, 2 in the afternoon, and 2 at bedtime.     Cholecalciferol (VITAMIN D) 50 MCG (2000 UT) tablet Take 2,000 Units by mouth daily.     COMBIGAN 0.2-0.5 % ophthalmic solution Place 1 drop into both eyes in the morning and at bedtime.     dicyclomine (BENTYL) 10 MG capsule Take 1 capsule (10 mg total) by mouth 3 (three) times daily before meals. 270 capsule 2   docusate sodium (COLACE) 100 MG capsule Take 100 mg by mouth 2 (two) times daily.     estradiol (ESTRACE) 0.1 MG/GM vaginal cream Place 0.5 g vaginally 2 (two) times a week. Place 0.5g nightly for two weeks then twice a week after 30 g 11   gabapentin (NEURONTIN) 100 MG capsule Take 1 tablet in the morning and bedtime along with 300mg  tablets. (Patient taking differently: Take 100 mg by mouth at  bedtime.) 180 capsule 3   gabapentin (NEURONTIN) 300 MG capsule Take 1 capsule (300 mg total) by mouth 3 (three) times daily. 480 capsule 1   levothyroxine (SYNTHROID) 100 MCG tablet Take 100 mcg by mouth daily before breakfast.     lipase/protease/amylase (CREON) 36000 UNITS CPEP capsule Take 2 capsules (72,000 Units total) by mouth 3 (three) times daily with meals. 200 capsule 1   Menthol, Topical Analgesic, (BIOFREEZE EX) Apply 1 drop topically daily as needed (back pain).     Multiple Vitamins-Minerals (CENTRUM SILVER 50+WOMEN PO) Take 1 tablet by mouth daily.     olmesartan (BENICAR) 20 MG tablet Take 1 tablet (20 mg total) by mouth daily. 90 tablet 3   ondansetron (ZOFRAN) 4 MG tablet Take 1 tablet (4 mg total) by mouth every 8 (eight) hours as needed for nausea or vomiting. 10 tablet 0   Propylene Glycol (SYSTANE COMPLETE OP) Place 1 drop into both eyes 2 (two) times daily as needed (dry eyes).     rosuvastatin (CRESTOR) 10 MG tablet Take 10 mg by mouth at bedtime.     Vibegron 75 MG TABS Take 1 tablet (75 mg total) by mouth daily. 90 tablet 2   fidaxomicin (DIFICID) 200 MG TABS tablet Take 1 tablet (200 mg total) by mouth 2 (two) times daily for 10 days. 20 tablet 0    Allergies as of 04/05/2023 - Review Complete 04/05/2023  Allergen Reaction Noted   Cosopt [dorzolamide hcl-timolol mal] Other (See Comments) 04/17/2022   Morphine  11/25/2022   Other Itching 10/12/2020   Oyster shell Hives, Diarrhea, and Nausea And Vomiting 10/14/2020    Social History   Socioeconomic History   Marital status: Widowed    Spouse name: Not on file   Number of children: 2   Years of education: Not on file   Highest education level: Not on file  Occupational History   Not on file  Tobacco Use   Smoking status: Never   Smokeless tobacco: Never  Vaping Use   Vaping status: Never Used  Substance and Sexual Activity   Alcohol use: Not Currently   Drug use: Never   Sexual activity: Not Currently   Other Topics Concern   Not  on file  Social History Narrative   Right Handed    Lives in a 4 story home with a basement. Only uses the first story.          Son is a Librarian, academic and daughter is a Charity fundraiser   Social Drivers of Corporate investment banker Strain: Not on file  Food Insecurity: No Food Insecurity (04/16/2022)   Hunger Vital Sign    Worried About Running Out of Food in the Last Year: Never true    Ran Out of Food in the Last Year: Never true  Transportation Needs: No Transportation Needs (04/16/2022)   PRAPARE - Administrator, Civil Service (Medical): No    Lack of Transportation (Non-Medical): No  Physical Activity: Not on file  Stress: Not on file  Social Connections: Unknown (08/03/2021)   [facility]   Social Network    Social Network: Not on file  Intimate Partner Violence: Not At Risk (04/16/2022)   Humiliation, Afraid, Rape, and Kick questionnaire    Fear of Current or Ex-Partner: No    Emotionally Abused: No    Physically Abused: No    Sexually Abused: No     Code Status   Code Status: Full Code  Review of Systems: All systems reviewed and negative except where noted in HPI.  Physical Exam: Vital signs in last 24 hours: Temp:  [97.7 F (36.5 C)-101.9 F (38.8 C)] 98.6 F (37 C) (01/16 0736) Pulse Rate:  [52-118] 78 (01/16 0730) Resp:  [12-22] 16 (01/16 0730) BP: (87-199)/(51-105) 148/76 (01/16 0730) SpO2:  [92 %-100 %] 98 % (01/16 0730) Weight:  [73.7 kg] 73.7 kg (01/15 2200)    General:  Pleasant female in NAD Psych:  Cooperative. Normal mood and affect Eyes: Pupils equal Ears:  Normal auditory acuity Nose: No deformity, discharge or lesions Neck:  Supple, no masses felt Lungs:  Clear to auscultation.  Heart:  Regular rate, regular rhythm.  Abdomen:  Soft, nondistended, mild generalized lower abdominal tenderness, active bowel sounds, no masses felt Rectal :  Deferred Msk: Symmetrical without gross  deformities.  Neurologic:  Alert, oriented, grossly normal neurologically Extremities : No edema Skin:  Intact without significant lesions.    Intake/Output from previous day: 01/15 0701 - 01/16 0700 In: 50 [IV Piggyback:50] Out: -  Intake/Output this shift:  No intake/output data recorded.   Willette Cluster, NP-C   04/06/2023, 9:11 AM

## 2023-04-06 NOTE — Progress Notes (Signed)
Subjective:  Patient admitted this morning, see detailed H&P by Dr Julian Reil  78 y.o. female with medical history significant of diverticulitis, C.Diff diarrhea in Jan 2024 treated with Dificid, HTN, HLD.   Pt in to ED after near sycopal episode this AM.  Associated with onset of diarrhea. Patient says that she took 4 tablets of amoxicillin day before for dental procedure.  She was hypotensive when she came in, blood pressure was 87/54  Vitals:   04/06/23 1000 04/06/23 1138  BP: 129/78 (!) 96/48  Pulse: 85 65  Resp: (!) 28 19  Temp:  98.6 F (37 C)  SpO2: 100% 97%      A/P  Colitis Diverticulitis vs  ischemic colitis. -Stool for C. difficile is negative -Gastroenterology has been consulted   HTN (hypertension) Having episodes of hypotension -Check orthostatic vital signs  Near syncope -Likely in setting of orthostatic hypotension as above -Orthostatic vital signs every 8 hours ordered.   [name] [facility]

## 2023-04-06 NOTE — Plan of Care (Addendum)
Admitted from ED ; alert and oriented x 4 ; on room air l; denies pain ; bed alarm on ' bed locked in lowest position ; educated on how to use cal bell and call for assistance whenever she wants to use the bathroom ; Patient had a small bleeding per rectum, notified MD Gagan ; orthostatic vitals done, patient denies dizziness, SOB  Problem: Education: Goal: Knowledge of General Education information will improve Description: Including pain rating scale, medication(s)/side effects and non-pharmacologic comfort measures Outcome: Progressing   Problem: Health Behavior/Discharge Planning: Goal: Ability to manage health-related needs will improve Outcome: Progressing   Problem: Clinical Measurements: Goal: Ability to maintain clinical measurements within normal limits will improve Outcome: Progressing Goal: Will remain free from infection Outcome: Progressing Goal: Diagnostic test results will improve Outcome: Progressing Goal: Respiratory complications will improve Outcome: Progressing Goal: Cardiovascular complication will be avoided Outcome: Progressing   Problem: Activity: Goal: Risk for activity intolerance will decrease Outcome: Progressing   Problem: Nutrition: Goal: Adequate nutrition will be maintained Outcome: Progressing   Problem: Coping: Goal: Level of anxiety will decrease Outcome: Progressing   Problem: Elimination: Goal: Will not experience complications related to bowel motility Outcome: Progressing Goal: Will not experience complications related to urinary retention Outcome: Progressing   Problem: Pain Managment: Goal: General experience of comfort will improve and/or be controlled Outcome: Progressing   Problem: Safety: Goal: Ability to remain free from injury will improve Outcome: Progressing   Problem: Skin Integrity: Goal: Risk for impaired skin integrity will decrease Outcome: Progressing

## 2023-04-06 NOTE — Assessment & Plan Note (Signed)
Despite concern of a hypotensive episode leading to IMA under perfusion.  Doesn't look like we are having any hypotensive episodes here in ED today.  On the contrary her BP remains persistently high (199/101 most recent). Cont home BP meds.

## 2023-04-06 NOTE — Care Management Obs Status (Signed)
MEDICARE OBSERVATION STATUS NOTIFICATION   Patient Details  Name: Tracy Hunt MRN: [id number] Date of Birth: [birth year]   Medicare Observation Status Notification Given:  Yes    Oletta Cohn, RN 04/06/2023, 2:03 PM

## 2023-04-07 DIAGNOSIS — K529 Noninfective gastroenteritis and colitis, unspecified: Secondary | ICD-10-CM | POA: Diagnosis not present

## 2023-04-07 DIAGNOSIS — R1013 Epigastric pain: Secondary | ICD-10-CM | POA: Diagnosis not present

## 2023-04-07 DIAGNOSIS — I10 Essential (primary) hypertension: Secondary | ICD-10-CM | POA: Diagnosis not present

## 2023-04-07 LAB — GASTROINTESTINAL PANEL BY PCR, STOOL (REPLACES STOOL CULTURE)

## 2023-04-07 MED ORDER — PEG-KCL-NACL-NASULF-NA ASC-C 100 G PO SOLR
0.5000 | Freq: Once | ORAL | Status: AC
  Administered 2023-04-07: 100 g via ORAL
  Filled 2023-04-07: qty 1

## 2023-04-07 MED ORDER — CHLORHEXIDINE GLUCONATE CLOTH 2 % EX PADS
6.0000 | MEDICATED_PAD | Freq: Once | CUTANEOUS | Status: AC
  Administered 2023-04-07: 6 via TOPICAL

## 2023-04-07 MED ORDER — CHLORHEXIDINE GLUCONATE CLOTH 2 % EX PADS: 6.0000 | MEDICATED_PAD | Freq: Once | CUTANEOUS | Status: DC

## 2023-04-07 NOTE — Progress Notes (Signed)
   04/07/23 0934 04/07/23 0937  Orthostatic Lying   BP- Lying 139/57  --   Pulse- Lying 67  --   Orthostatic Sitting  BP- Sitting 130/81  --   Pulse- Sitting 71  --   Orthostatic Standing at 0 minutes  BP- Standing at 0 minutes 132/78  --   Pulse- Standing at 0 minutes 72  --   Orthostatic Standing at 3 minutes  BP- Standing at 3 minutes  --  128/85  Pulse- Standing at 3 minutes  --  71   Orthostatic BP performed, pt denied dizziness

## 2023-04-07 NOTE — H&P (View-Only) (Signed)
Gastroenterology Inpatient Follow Up    Subjective: Patient does feel better.  Her abdominal pain has improved.  She is still having loose stools with some rectal bleeding.  Objective: Vital signs in last 24 hours: Temp:  [97.9 F (36.6 C)-100.2 F (37.9 C)] 97.9 F (36.6 C) (01/17 1211) Pulse Rate:  [60-83] 73 (01/17 1211) Resp:  [16-20] 18 (01/17 1211) BP: (115-164)/(64-94) 115/64 (01/17 1211) SpO2:  [93 %-98 %] 97 % (01/17 1211) Last BM Date : 04/06/23  Intake/Output from previous day: 01/16 0701 - 01/17 0700 In: 50 [IV Piggyback:50] Out: -  Intake/Output this shift: Total I/O In: 480 [P.O.:480] Out: -   General appearance: alert and cooperative Resp: No increased work of breathing Cardio: Regular rate GI: Nondistended  Lab Results: Recent Labs    04/05/23 1232 04/06/23 0305  WBC 6.7 6.5  HGB 13.1 14.2  HCT 39.6 42.3  PLT 123* 119*   BMET Recent Labs    04/05/23 1232 04/06/23 0305  NA 136 137  K 4.2 3.8  CL 101 103  CO2 26 25  GLUCOSE 137* 128*  BUN 13 14  CREATININE 0.79 1.05*  CALCIUM 9.5 9.3   LFT Recent Labs    04/05/23 1232  PROT 7.0  ALBUMIN 3.4*  AST 24  ALT 13  ALKPHOS 60  BILITOT 0.7  BILIDIR 0.2  IBILI 0.5   PT/INR No results for input(s): "LABPROT", "INR" in the last 72 hours. Hepatitis Panel No results for input(s): "HEPBSAG", "HCVAB", "HEPAIGM", "HEPBIGM" in the last 72 hours. C-Diff Recent Labs    04/06/23 0600  CDIFFTOX NEGATIVE    Studies/Results: No results found.  Medications: I have reviewed the patient's current medications. Scheduled:  brimonidine  1 drop Both Eyes BID   And   timolol  1 drop Both Eyes BID   dicyclomine  10 mg Oral TID AC   enoxaparin (LOVENOX) injection  40 mg Subcutaneous Q24H   gabapentin  100 mg Oral QHS   gabapentin  300 mg Oral TID   irbesartan  150 mg Oral Daily   levothyroxine  100 mcg Oral QAC breakfast   lipase/protease/amylase  72,000 Units Oral TID WC   mirabegron  ER  25 mg Oral Daily   peg 3350 powder  0.5 kit Oral Once   rosuvastatin  10 mg Oral QHS   vancomycin  125 mg Oral Daily   Continuous:  piperacillin-tazobactam (ZOSYN)  IV 3.375 g (04/07/23 1410)   ZOX:[id number] **OR** acetaminophen, ondansetron **OR** ondansetron (ZOFRAN) IV, phenol, polyvinyl alcohol  Assessment/Plan: 78 year old female with history of pancreatic insufficiency, IBS, diverticulitis, prior C. difficile, family history of colon cancer presented with a syncopal episode and fever of 101.9. She also endorses lower abdominal pain and loose stools. Denies blood in the stools. CT scan showed colon wall thickening with stranding in the sigmoid colon and rectum. Last colonoscopy in 05/2019 showed 1 small polyp in the transverse colon that was removed and many large diverticuli in the sigmoid and descending colon. Patient was placed on Zosyn upon arrival. C. difficile stool test and GI pathogen panel were negative.  Infectious versus ischemic colitis are the most likely possibilities, though could consider inflammatory bowel disease as well.  Patient does appear to be slowly improving.  Since patient's infectious stool studies came back as negative, I did offer the patient opportunity to get a flexible sigmoidoscopy to see if she has any signs of inflammatory bowel disease.  Patient would like to proceed with getting  the flexible sigmoidoscopy tomorrow.  I went over the risk and benefits of the procedure including the risk of bleeding, infection, perforation, and changes in vital signs as result of the sedation.  Patient understands and would still like to proceed with flexible sigmoidoscopy -Continue Zosyn therapy -Continue daily oral vancomycin for C. difficile prophylaxis -Plan for flexible sigmoidoscopy tomorrow   LOS: 1 day   Tracy Hunt 04/07/2023, 4:40 PM

## 2023-04-07 NOTE — Plan of Care (Signed)

## 2023-04-07 NOTE — Progress Notes (Signed)
Events this shift:  Patient noted with a small amount of blood after patient using the restroom. This has happened X2. Patient did not have Bms when blood passed, passed with eliminating gas. No c/o pain when patient uses restroom. Will continue to monitor this shift.

## 2023-04-07 NOTE — Progress Notes (Signed)
[facility]  PROGRESS NOTE  Tracy Hunt UYQ:[id number] DOB: 09-Sep-1945 DOA: 04/05/2023 PCP: Chilton Greathouse, MD   Brief HPI:    78 y.o. female with medical history significant of diverticulitis, C.Diff diarrhea in Jan 2024 treated with Dificid, HTN, HLD.   Pt in to ED after near sycopal episode this AM.  Associated with onset of diarrhea. Patient says that she took 4 tablets of amoxicillin day before for dental procedure.   She was hypotensive when she came in, blood pressure was 87/54    Assessment/Plan:     Colitis -Likely ischemic colitis -Stool for C. difficile is negative; empirically started on p.o. vancomycin per gastroenterology -GI pathogen panel is pending -Gastroenterology consulted Plan for flexible sigmoidoscopy if no improvement in next few days -Hemoglobin is stable at 14.2     HTN (hypertension) -Started back on irbesartan 150 mg p.o. daily -Orthostatic vital signs were negative   Near syncope -Likely in setting of above -Orthostatic vital signs negative   Hyperlipidemia -Continue rosuvastatin  Medications     brimonidine  1 drop Both Eyes BID   And   timolol  1 drop Both Eyes BID   dicyclomine  10 mg Oral TID AC   enoxaparin (LOVENOX) injection  40 mg Subcutaneous Q24H   gabapentin  100 mg Oral QHS   gabapentin  300 mg Oral TID   irbesartan  150 mg Oral Daily   levothyroxine  100 mcg Oral QAC breakfast   lipase/protease/amylase  72,000 Units Oral TID WC   mirabegron ER  25 mg Oral Daily   rosuvastatin  10 mg Oral QHS   vancomycin  125 mg Oral Daily     Data Reviewed:   CBG:  No results for input(s): "GLUCAP" in the last 168 hours.  SpO2: 97 %    Vitals:   04/07/23 0135 04/07/23 0137 04/07/23 0554 04/07/23 0846  BP: (!) 164/85 (!) 151/94 132/75 (!) 140/87  Pulse: 76 83 70 60  Resp: 18 18 20 16   Temp: 98.8 F (37.1 C) 98.8 F (37.1 C) 98.5 F (36.9 C) 98.6 F (37 C)  TempSrc:   Oral Oral  SpO2: 93% 94% 95%  97%  Weight:          Data Reviewed:  Basic Metabolic Panel: Recent Labs  Lab 04/05/23 1232 04/06/23 0305  NA 136 137  K 4.2 3.8  CL 101 103  CO2 26 25  GLUCOSE 137* 128*  BUN 13 14  CREATININE 0.79 1.05*  CALCIUM 9.5 9.3    CBC: Recent Labs  Lab 04/05/23 1232 04/06/23 0305  WBC 6.7 6.5  NEUTROABS 4.7  --   HGB 13.1 14.2  HCT 39.6 42.3  MCV 96.8 95.3  PLT 123* 119*    LFT Recent Labs  Lab 04/05/23 1232  AST 24  ALT 13  ALKPHOS 60  BILITOT 0.7  PROT 7.0  ALBUMIN 3.4*     Antibiotics: Anti-infectives (From admission, onward)    Start     Dose/Rate Route Frequency Ordered Stop   04/06/23 1800  vancomycin (VANCOCIN) capsule 125 mg        125 mg Oral Daily 04/06/23 1703     04/06/23 0300  piperacillin-tazobactam (ZOSYN) IVPB 3.375 g        3.375 g 12.5 mL/hr over 240 Minutes Intravenous Every 8 hours 04/05/23 2038     04/05/23 2045  piperacillin-tazobactam (ZOSYN) IVPB 3.375 g        3.375 g 100 mL/hr over  30 Minutes Intravenous  Once 04/05/23 2034 04/05/23 2205   04/05/23 2000  ceFEPIme (MAXIPIME) 2 g in sodium chloride 0.9 % 100 mL IVPB  Status:  Discontinued        2 g 200 mL/hr over 30 Minutes Intravenous  Once 04/05/23 1950 04/05/23 2030   04/05/23 2000  metroNIDAZOLE (FLAGYL) IVPB 500 mg  Status:  Discontinued        500 mg 100 mL/hr over 60 Minutes Intravenous  Once 04/05/23 1950 04/05/23 2030   04/05/23 2000  vancomycin (VANCOCIN) IVPB 1000 mg/200 mL premix  Status:  Discontinued        1,000 mg 200 mL/hr over 60 Minutes Intravenous  Once 04/05/23 1950 04/05/23 1958   04/05/23 2000  Vancomycin (VANCOCIN) 1,500 mg in sodium chloride 0.9 % 500 mL IVPB  Status:  Discontinued        1,500 mg 250 mL/hr over 120 Minutes Intravenous  Once 04/05/23 1958 04/05/23 2119   04/05/23 0000  fidaxomicin (DIFICID) 200 MG TABS tablet  Status:  Discontinued        200 mg Oral 2 times daily 04/05/23 1836 04/05/23    04/05/23 0000  fidaxomicin (DIFICID) 200 MG  TABS tablet        200 mg Oral 2 times daily 04/05/23 1837 04/15/23 2359        DVT prophylaxis: Lovenox  Code Status: Full code  Family Communication: No family at bedside   CONSULTS gastroenterology   Subjective   Denies abdominal pain.  No diarrhea.  Did pass small amount of blood with bowel movement.  No diarrhea.   Objective    Physical Examination:   General-appears in no acute distress Heart-S1-S2, regular, no murmur auscultated Lungs-clear to auscultation bilaterally, no wheezing or crackles auscultated Abdomen-soft, nontender, no organomegaly Extremities-no edema in the lower extremities Neuro-alert, oriented x3, no focal deficit noted   Status is: Inpatient:             [name]   [facility] If 7PM-7AM, please contact night-coverage at [email or web address], Office  [phone]   04/07/2023, 10:10 AM  LOS: 1 day

## 2023-04-07 NOTE — Progress Notes (Signed)
Gastroenterology Inpatient Follow Up    Subjective: Patient does feel better.  Her abdominal pain has improved.  She is still having loose stools with some rectal bleeding.  Objective: Vital signs in last 24 hours: Temp:  [97.9 F (36.6 C)-100.2 F (37.9 C)] 97.9 F (36.6 C) (01/17 1211) Pulse Rate:  [60-83] 73 (01/17 1211) Resp:  [16-20] 18 (01/17 1211) BP: (115-164)/(64-94) 115/64 (01/17 1211) SpO2:  [93 %-98 %] 97 % (01/17 1211) Last BM Date : 04/06/23  Intake/Output from previous day: 01/16 0701 - 01/17 0700 In: 50 [IV Piggyback:50] Out: -  Intake/Output this shift: Total I/O In: 480 [P.O.:480] Out: -   General appearance: alert and cooperative Resp: No increased work of breathing Cardio: Regular rate GI: Nondistended  Lab Results: Recent Labs    04/05/23 1232 04/06/23 0305  WBC 6.7 6.5  HGB 13.1 14.2  HCT 39.6 42.3  PLT 123* 119*   BMET Recent Labs    04/05/23 1232 04/06/23 0305  NA 136 137  K 4.2 3.8  CL 101 103  CO2 26 25  GLUCOSE 137* 128*  BUN 13 14  CREATININE 0.79 1.05*  CALCIUM 9.5 9.3   LFT Recent Labs    04/05/23 1232  PROT 7.0  ALBUMIN 3.4*  AST 24  ALT 13  ALKPHOS 60  BILITOT 0.7  BILIDIR 0.2  IBILI 0.5   PT/INR No results for input(s): "LABPROT", "INR" in the last 72 hours. Hepatitis Panel No results for input(s): "HEPBSAG", "HCVAB", "HEPAIGM", "HEPBIGM" in the last 72 hours. C-Diff Recent Labs    04/06/23 0600  CDIFFTOX NEGATIVE    Studies/Results: No results found.  Medications: I have reviewed the patient's current medications. Scheduled:  brimonidine  1 drop Both Eyes BID   And   timolol  1 drop Both Eyes BID   dicyclomine  10 mg Oral TID AC   enoxaparin (LOVENOX) injection  40 mg Subcutaneous Q24H   gabapentin  100 mg Oral QHS   gabapentin  300 mg Oral TID   irbesartan  150 mg Oral Daily   levothyroxine  100 mcg Oral QAC breakfast   lipase/protease/amylase  72,000 Units Oral TID WC   mirabegron  ER  25 mg Oral Daily   peg 3350 powder  0.5 kit Oral Once   rosuvastatin  10 mg Oral QHS   vancomycin  125 mg Oral Daily   Continuous:  piperacillin-tazobactam (ZOSYN)  IV 3.375 g (04/07/23 1410)   ZOX:[id number] **OR** acetaminophen, ondansetron **OR** ondansetron (ZOFRAN) IV, phenol, polyvinyl alcohol  Assessment/Plan: 78 year old female with history of pancreatic insufficiency, IBS, diverticulitis, prior C. difficile, family history of colon cancer presented with a syncopal episode and fever of 101.9. She also endorses lower abdominal pain and loose stools. Denies blood in the stools. CT scan showed colon wall thickening with stranding in the sigmoid colon and rectum. Last colonoscopy in 05/2019 showed 1 small polyp in the transverse colon that was removed and many large diverticuli in the sigmoid and descending colon. Patient was placed on Zosyn upon arrival. C. difficile stool test and GI pathogen panel were negative.  Infectious versus ischemic colitis are the most likely possibilities, though could consider inflammatory bowel disease as well.  Patient does appear to be slowly improving.  Since patient's infectious stool studies came back as negative, I did offer the patient opportunity to get a flexible sigmoidoscopy to see if she has any signs of inflammatory bowel disease.  Patient would like to proceed with getting  the flexible sigmoidoscopy tomorrow.  I went over the risk and benefits of the procedure including the risk of bleeding, infection, perforation, and changes in vital signs as result of the sedation.  Patient understands and would still like to proceed with flexible sigmoidoscopy -Continue Zosyn therapy -Continue daily oral vancomycin for C. difficile prophylaxis -Plan for flexible sigmoidoscopy tomorrow   LOS: 1 day   Tracy Hunt 04/07/2023, 4:40 PM

## 2023-04-07 NOTE — Progress Notes (Signed)
Mobility Specialist: Progress Note   04/07/23 1219  Mobility  Activity Ambulated with assistance in hallway  Level of Assistance Contact guard assist, steadying assist  Assistive Device None  Distance Ambulated (ft) 500 ft  Activity Response Tolerated well  Mobility Referral Yes  Mobility visit 1 Mobility  Mobility Specialist Start Time (ACUTE ONLY) 1010  Mobility Specialist Stop Time (ACUTE ONLY) 1020  Mobility Specialist Time Calculation (min) (ACUTE ONLY) 10 min    Pt was eager for mobility session - received in chair. CG throughout d/t unsteadiness that improved with further ambulation. No complaints. Returned to room without fault. Left in chair with all needs met, call bell in reach.   Maurene Capes Mobility Specialist Please contact via SecureChat or Rehab office at [phone]

## 2023-04-08 ENCOUNTER — Encounter (HOSPITAL_COMMUNITY)
Admission: EM | Disposition: A | Discharge status: Home / Self Care | Payer: Self-pay | Source: Home / Self Care | Attending: Family Medicine

## 2023-04-08 ENCOUNTER — Inpatient Hospital Stay (HOSPITAL_COMMUNITY): Payer: Medicare HMO | Admitting: Anesthesiology

## 2023-04-08 DIAGNOSIS — K5730 Diverticulosis of large intestine without perforation or abscess without bleeding: Secondary | ICD-10-CM | POA: Diagnosis not present

## 2023-04-08 DIAGNOSIS — K648 Other hemorrhoids: Secondary | ICD-10-CM | POA: Diagnosis not present

## 2023-04-08 DIAGNOSIS — K529 Noninfective gastroenteritis and colitis, unspecified: Secondary | ICD-10-CM | POA: Diagnosis not present

## 2023-04-08 DIAGNOSIS — I10 Essential (primary) hypertension: Secondary | ICD-10-CM | POA: Diagnosis not present

## 2023-04-08 DIAGNOSIS — E039 Hypothyroidism, unspecified: Secondary | ICD-10-CM | POA: Diagnosis not present

## 2023-04-08 DIAGNOSIS — R1013 Epigastric pain: Secondary | ICD-10-CM | POA: Diagnosis not present

## 2023-04-08 HISTORY — PX: BIOPSY: SHX5522

## 2023-04-08 HISTORY — PX: FLEXIBLE SIGMOIDOSCOPY: SHX5431

## 2023-04-08 LAB — COMPREHENSIVE METABOLIC PANEL
ALT: 13 U/L (ref 0–44)
AST: 22 U/L (ref 15–41)
Albumin: 3 g/dL — ABNORMAL LOW (ref 3.5–5.0)
Alkaline Phosphatase: 55 U/L (ref 38–126)
Anion gap: 6 (ref 5–15)
BUN: 10 mg/dL (ref 8–23)
CO2: 26 mmol/L (ref 22–32)
Calcium: 9.2 mg/dL (ref 8.9–10.3)
Chloride: 109 mmol/L (ref 98–111)
Creatinine, Ser: 1.05 mg/dL — ABNORMAL HIGH (ref 0.44–1.00)
GFR, Estimated: 55 mL/min — ABNORMAL LOW (ref >60)
Glucose, Bld: 120 mg/dL — ABNORMAL HIGH (ref 70–99)
Potassium: 3.3 mmol/L — ABNORMAL LOW (ref 3.5–5.1)
Sodium: 141 mmol/L (ref 135–145)
Total Bilirubin: 0.9 mg/dL (ref 0.0–1.2)
Total Protein: 6.6 g/dL (ref 6.5–8.1)

## 2023-04-08 LAB — SURGICAL PCR SCREEN
MRSA, PCR: NEGATIVE
Staphylococcus aureus: NEGATIVE

## 2023-04-08 LAB — CBC
HCT: 34.6 % — ABNORMAL LOW (ref 36.0–46.0)
Hemoglobin: 12 g/dL (ref 12.0–15.0)
MCH: 32.5 pg (ref 26.0–34.0)
MCHC: 34.7 g/dL (ref 30.0–36.0)
MCV: 93.8 fL (ref 80.0–100.0)
Platelets: 105 10*3/uL — ABNORMAL LOW (ref 150–400)
RBC: 3.69 MIL/uL — ABNORMAL LOW (ref 3.87–5.11)
RDW: 12.2 % (ref 11.5–15.5)
WBC: 4.7 10*3/uL (ref 4.0–10.5)
nRBC: 0 % (ref 0.0–0.2)

## 2023-04-08 SURGERY — SIGMOIDOSCOPY, FLEXIBLE
Anesthesia: Monitor Anesthesia Care

## 2023-04-08 MED ORDER — POLYETHYLENE GLYCOL 3350 17 G PO PACK: 17.0000 g | PACK | Freq: Every day | ORAL | 3 refills | Status: AC

## 2023-04-08 MED ORDER — LIDOCAINE 2% (20 MG/ML) 5 ML SYRINGE
INTRAMUSCULAR | Status: DC | PRN
  Administered 2023-04-08: 100 mg via INTRAVENOUS

## 2023-04-08 MED ORDER — SODIUM CHLORIDE 0.9 % IV SOLN: INTRAVENOUS | Status: DC | PRN

## 2023-04-08 MED ORDER — POLYETHYLENE GLYCOL 3350 17 G PO PACK: 17.0000 g | PACK | Freq: Every day | ORAL | Status: DC

## 2023-04-08 MED ORDER — POTASSIUM CHLORIDE 20 MEQ PO PACK
40.0000 meq | PACK | Freq: Once | ORAL | Status: AC
  Administered 2023-04-08: 40 meq via ORAL
  Filled 2023-04-08: qty 2

## 2023-04-08 MED ORDER — PROPOFOL 500 MG/50ML IV EMUL
INTRAVENOUS | Status: DC | PRN
  Administered 2023-04-08: 50 ug/kg/min via INTRAVENOUS

## 2023-04-08 MED ORDER — PROPOFOL 10 MG/ML IV BOLUS
INTRAVENOUS | Status: DC | PRN
  Administered 2023-04-08: 30 mg via INTRAVENOUS
  Administered 2023-04-08: 40 mg via INTRAVENOUS

## 2023-04-08 MED ORDER — SACCHAROMYCES BOULARDII 250 MG PO CAPS: 250.0000 mg | ORAL_CAPSULE | Freq: Two times a day (BID) | ORAL | 0 refills | Status: AC

## 2023-04-08 NOTE — Interval H&P Note (Signed)
History and Physical Interval Note:  04/08/2023 9:45 AM  Tracy Hunt  has presented today for surgery, with the diagnosis of Colitis, rectal bleeding, diarrhea.  The various methods of treatment have been discussed with the patient and family. After consideration of risks, benefits and other options for treatment, the patient has consented to  Procedure(s): FLEXIBLE SIGMOIDOSCOPY (N/A) as a surgical intervention.  The patient's history has been reviewed, patient examined, no change in status, stable for surgery.  I have reviewed the patient's chart and labs.  Questions were answered to the patient's satisfaction.     Imogene Burn

## 2023-04-08 NOTE — Progress Notes (Signed)
[facility]  PROGRESS NOTE  Tracy Hunt WUX:[id number] DOB: May 24, 1945 DOA: 04/05/2023 PCP: Chilton Greathouse, MD   Brief HPI:    78 y.o. female with medical history significant of diverticulitis, C.Diff diarrhea in Jan 2024 treated with Dificid, HTN, HLD.   Pt in to ED after near sycopal episode this AM.  Associated with onset of diarrhea. Patient says that she took 4 tablets of amoxicillin day before for dental procedure.   She was hypotensive when she came in, blood pressure was 87/54    Assessment/Plan:    Colitis -CT scan showed colon wall thickening and stranding in sigmoid colon and rectum. -Gastroenterology was consulted, patient underwent flexible sigmoidoscopy which showed diverticulosis in the sigmoid colon and descending colon, no close inflammation found in the rectum and the sigmoid colon and descending colon which was biopsied.  Nonbleeding internal hemorrhoids.  A single solitary ulcer in the rectum else was biopsied. -Patient has clinically improved.  Received antibiotics for 3 days. -She was started on Zosyn on 04/05/2023 -Also started on empiric p.o. vancomycin per GI as she has previous history of C. difficile in January 2024 -Called and discussed with Dr. Leonides Schanz, GI.  She recommends to stop antibiotics at this time. -Will discharge on Florastor, MiraLAX -Follow-up gastroenterology as outpatient. Patient had large-loose BM today, will likely discharge home in a.m.    HTN (hypertension) -Continue Benicar -Orthostatic vital signs were negative   Near syncope -Likely in setting of above -Orthostatic vital signs negative -Patient does wear TED hose at home. -Recommend to check blood pressure daily and follow-up with PCP in 1 week.     Hyperlipidemia -Continue rosuvastatin   Hypokalemia -Potassium is 3.3 -Replace potassium , check BMP in am   Medications     brimonidine  1 drop Both Eyes BID   And   timolol  1 drop Both Eyes BID    dicyclomine  10 mg Oral TID AC   enoxaparin (LOVENOX) injection  40 mg Subcutaneous Q24H   gabapentin  100 mg Oral QHS   gabapentin  300 mg Oral TID   irbesartan  150 mg Oral Daily   levothyroxine  100 mcg Oral QAC breakfast   lipase/protease/amylase  72,000 Units Oral TID WC   mirabegron ER  25 mg Oral Daily   polyethylene glycol  17 g Oral Daily   rosuvastatin  10 mg Oral QHS   vancomycin  125 mg Oral Daily     Data Reviewed:   CBG:  No results for input(s): "GLUCAP" in the last 168 hours.  SpO2: 99 %    Vitals:   04/08/23 1110 04/08/23 1115 04/08/23 1125 04/08/23 1153  BP: (!) 156/81 (!) 150/85 (!) 148/79 (!) 170/84  Pulse: 66 68 60 60  Resp: 19 17 16    Temp: 97.6 F (36.4 C)   98.5 F (36.9 C)  TempSrc:    Oral  SpO2: 97% 97% 97% 99%  Weight:          Data Reviewed:  Basic Metabolic Panel: Recent Labs  Lab 04/05/23 1232 04/06/23 0305 04/08/23 0706  NA 136 137 141  K 4.2 3.8 3.3*  CL 101 103 109  CO2 26 25 26   GLUCOSE 137* 128* 120*  BUN 13 14 10   CREATININE 0.79 1.05* 1.05*  CALCIUM 9.5 9.3 9.2    CBC: Recent Labs  Lab 04/05/23 1232 04/06/23 0305 04/08/23 0706  WBC 6.7 6.5 4.7  NEUTROABS 4.7  --   --   HGB  13.1 14.2 12.0  HCT 39.6 42.3 34.6*  MCV 96.8 95.3 93.8  PLT 123* 119* 105*    LFT Recent Labs  Lab 04/05/23 1232 04/08/23 0706  AST 24 22  ALT 13 13  ALKPHOS 60 55  BILITOT 0.7 0.9  PROT 7.0 6.6  ALBUMIN 3.4* 3.0*     Antibiotics: Anti-infectives (From admission, onward)    Start     Dose/Rate Route Frequency Ordered Stop   04/06/23 1800  vancomycin (VANCOCIN) capsule 125 mg        125 mg Oral Daily 04/06/23 1703     04/06/23 0300  piperacillin-tazobactam (ZOSYN) IVPB 3.375 g        3.375 g 12.5 mL/hr over 240 Minutes Intravenous Every 8 hours 04/05/23 2038     04/05/23 2045  piperacillin-tazobactam (ZOSYN) IVPB 3.375 g        3.375 g 100 mL/hr over 30 Minutes Intravenous  Once 04/05/23 2034 04/05/23 2205   04/05/23  2000  ceFEPIme (MAXIPIME) 2 g in sodium chloride 0.9 % 100 mL IVPB  Status:  Discontinued        2 g 200 mL/hr over 30 Minutes Intravenous  Once 04/05/23 1950 04/05/23 2030   04/05/23 2000  metroNIDAZOLE (FLAGYL) IVPB 500 mg  Status:  Discontinued        500 mg 100 mL/hr over 60 Minutes Intravenous  Once 04/05/23 1950 04/05/23 2030   04/05/23 2000  vancomycin (VANCOCIN) IVPB 1000 mg/200 mL premix  Status:  Discontinued        1,000 mg 200 mL/hr over 60 Minutes Intravenous  Once 04/05/23 1950 04/05/23 1958   04/05/23 2000  Vancomycin (VANCOCIN) 1,500 mg in sodium chloride 0.9 % 500 mL IVPB  Status:  Discontinued        1,500 mg 250 mL/hr over 120 Minutes Intravenous  Once 04/05/23 1958 04/05/23 2119   04/05/23 0000  fidaxomicin (DIFICID) 200 MG TABS tablet  Status:  Discontinued        200 mg Oral 2 times daily 04/05/23 1836 04/05/23    04/05/23 0000  fidaxomicin (DIFICID) 200 MG TABS tablet        200 mg Oral 2 times daily 04/05/23 1837 04/15/23 2359        DVT prophylaxis: Lovenox  Code Status: Full code  Family Communication: Discussed with patient's daughter at bedside   CONSULTS gastroenterology   Subjective   Came back from flexible sigmoidoscopy which showed rectal inflammation   Objective    Physical Examination:  General-appears in no acute distress Heart-S1-S2, regular, no murmur auscultated Lungs-clear to auscultation bilaterally, no wheezing or crackles auscultated Abdomen-soft, nontender, no organomegaly Extremities-no edema in the lower extremities Neuro-alert, oriented x3, no focal deficit noted    Status is: Inpatient:             [name]   [facility] If 7PM-7AM, please contact night-coverage at [email or web address], Office  [phone]   04/08/2023, 12:06 PM  LOS: 2 days

## 2023-04-08 NOTE — Discharge Summary (Signed)
Physician Discharge Summary   Patient: Tracy Hunt Portal MRN: [id number] DOB: Jun 21, 1945  Admit date:     04/05/2023  Discharge date: 04/09/23  Discharge Physician: [name]   PCP: Chilton Greathouse, MD   Recommendations at discharge:   Follow-up gastroenterology outpatient Follow-up biopsy results as outpatient  Discharge Diagnoses: Principal Problem:   Colitis Active Problems:   HTN (hypertension)   Syncope  Resolved Problems:   * No resolved hospital problems. *  Hospital Course:  78 y.o. female with medical history significant of diverticulitis, C.Diff diarrhea in Jan 2024 treated with Dificid, HTN, HLD.   Pt in to ED after near sycopal episode this AM.  Associated with onset of diarrhea. Patient says that she took 4 tablets of amoxicillin day before for dental procedure.   She was hypotensive when she came in, blood pressure was 87/54     Assessment and Plan:  Colitis -CT scan showed colon wall thickening and stranding in sigmoid colon and rectum. -Gastroenterology was consulted, patient underwent flexible sigmoidoscopy which showed diverticulosis in the sigmoid colon and descending colon, no close inflammation found in the rectum and the sigmoid colon and descending colon which was biopsied.  Nonbleeding internal hemorrhoids.  A single solitary ulcer in the rectum else was biopsied. -Patient has clinically improved.  Received antibiotics for 3 days. -She was started on Zosyn on 04/05/2023 -Also started on empiric p.o. vancomycin per GI as she has previous history of C. difficile in January 2024 -Called and discussed with Dr. Leonides Schanz, GI.  She recommends to stop antibiotics at this time. -Will discharge on Florastor, MiraLAX -Follow-up gastroenterology as outpatient.   HTN (hypertension) -Continue Benicar -Orthostatic vital signs were negative   Near syncope -Likely in setting of above -Orthostatic vital signs negative -Patient does wear TED hose at  home. -Recommend to check blood pressure daily and follow-up with PCP in 1 week.     Hyperlipidemia -Continue rosuvastatin  Hypokalemia -replete        Consultants: Gastroenterology Procedures performed: Flexible sigmoidoscopy Disposition: Home Diet recommendation:  Discharge Diet Orders (From admission, onward)     Start     Ordered   04/08/23 0000  Diet - low sodium heart healthy        04/08/23 1422           Regular diet DISCHARGE MEDICATION: Allergies as of 04/09/2023       Reactions   Cosopt [dorzolamide Hcl-timolol Mal] Other (See Comments)   "Very irritated"   Morphine    Makes me hyper   Other Itching   Adhesive tape  Paper tape is okay   [employer], Diarrhea, Nausea And Vomiting   Oysters - cramping        Medication List     TAKE these medications    acetaminophen 650 MG CR tablet Commonly known as: TYLENOL Take 1,300 mg by mouth in the morning, at noon, and at bedtime. Takes 2 tablets in the morning, 2 in the afternoon, and 2 at bedtime.   BIOFREEZE EX Apply 1 drop topically daily as needed (back pain).   CENTRUM SILVER 50+WOMEN PO Take 1 tablet by mouth daily.   Combigan 0.2-0.5 % ophthalmic solution Generic drug: brimonidine-timolol Place 1 drop into both eyes in the morning and at bedtime.   Creon 36000-114000 units Cpep capsule Generic drug: lipase/protease/amylase Take 2 capsules (72,000 Units total) by mouth 3 (three) times daily with meals.   dicyclomine 10 MG capsule Commonly known as: BENTYL  Take 1 capsule (10 mg total) by mouth 3 (three) times daily before meals.   docusate sodium 100 MG capsule Commonly known as: COLACE Take 100 mg by mouth 2 (two) times daily.   estradiol 0.1 MG/GM vaginal cream Commonly known as: ESTRACE Place 0.5 g vaginally 2 (two) times a week. Place 0.5g nightly for two weeks then twice a week after   fidaxomicin 200 MG Tabs tablet Commonly known as: DIFICID Take 1 tablet (200 mg  total) by mouth 2 (two) times daily for 10 days.   gabapentin 100 MG capsule Commonly known as: NEURONTIN Take 1 tablet in the morning and bedtime along with 300mg  tablets. What changed:  how much to take how to take this when to take this additional instructions   gabapentin 300 MG capsule Commonly known as: NEURONTIN Take 1 capsule (300 mg total) by mouth 3 (three) times daily. What changed: Another medication with the same name was changed. Make sure you understand how and when to take each.   levothyroxine 100 MCG tablet Commonly known as: SYNTHROID Take 100 mcg by mouth daily before breakfast.   olmesartan 20 MG tablet Commonly known as: BENICAR Take 1 tablet (20 mg total) by mouth daily.   ondansetron 4 MG tablet Commonly known as: ZOFRAN Take 1 tablet (4 mg total) by mouth every 8 (eight) hours as needed for nausea or vomiting.   polyethylene glycol 17 g packet Commonly known as: MIRALAX / GLYCOLAX Take 17 g by mouth daily.   rosuvastatin 10 MG tablet Commonly known as: CRESTOR Take 10 mg by mouth at bedtime.   saccharomyces boulardii 250 MG capsule Commonly known as: Florastor Take 1 capsule (250 mg total) by mouth 2 (two) times daily.   SYSTANE COMPLETE OP Place 1 drop into both eyes 2 (two) times daily as needed (dry eyes).   Vibegron 75 MG Tabs Take 1 tablet (75 mg total) by mouth daily.   Vitamin D 50 MCG (2000 UT) tablet Take 2,000 Units by mouth daily.        Follow-up Information     Doree Albee, PA-C .   Specialty: Gastroenterology Why: As needed Contact information: [address] [place] [address] [phone]         Chilton Greathouse, MD .   Specialty: Internal Medicine Contact information: [address] [place] [address] [phone]                Discharge Exam: Ceasar Mons Weights   04/05/23 2200  Weight: 73.7 kg   General-appears in no acute distress Heart-S1-S2, regular, no murmur  auscultated Lungs-clear to auscultation bilaterally, no wheezing or crackles auscultated Abdomen-soft, nontender, no organomegaly Extremities-no edema in the lower extremities Neuro-alert, oriented x3, no focal deficit noted  Condition at discharge: good  The results of significant diagnostics from this hospitalization (including imaging, microbiology, ancillary and laboratory) are listed below for reference.   Imaging Studies: CT ABDOMEN PELVIS W CONTRAST Result Date: 04/05/2023 CLINICAL DATA:  Near syncope, pallor and diaphoresis, unsteady gait with initial altered mental status. EXAM: CT ABDOMEN AND PELVIS WITH CONTRAST TECHNIQUE: Multidetector CT imaging of the abdomen and pelvis was performed using the standard protocol following bolus administration of intravenous contrast. RADIATION DOSE REDUCTION: This exam was performed according to the departmental dose-optimization program which includes automated exposure control, adjustment of the mA and/or kV according to patient size and/or use of iterative reconstruction technique. CONTRAST:  75mL OMNIPAQUE IOHEXOL 350 MG/ML SOLN COMPARISON:  04/16/2022 FINDINGS: Lower  chest: Stable moderate cardiomegaly. Small type 1 hiatal hernia. Hepatobiliary: Cholecystectomy.  Otherwise unremarkable. Pancreas: Unremarkable Spleen: Punctate calcifications compatible with old granulomatous disease. Adrenals/Urinary Tract: Unremarkable Stomach/Bowel: Moderate abnormal colon wall thickening in the descending colon becoming more striking/severe in the sigmoid colon and especially the rectum, with substantial perirectal stranding and notable mucosal wall thickening in the involved segment but not involving the right colon. Transverse colon involvement is questionable due to nondistention. There is moderate sigmoid colon diverticulosis but no focal indicator of acute diverticulitis. Notable presacral edema accompanies the perirectal stranding and edema and there is  potentially a trace amount of ascites in the pelvis. This pattern of striking wall thickening in the distal colon (particularly the sigmoid colon and rectum) has been present on some prior exams, most notably 03/31/2022. No pneumatosis is identified. No extraluminal gas or abscess. No dilated small bowel. Vascular/Lymphatic: Atherosclerosis is present, including aortoiliac atherosclerotic disease. There is some mild atherosclerotic plaque at the origins of the celiac trunk and SMA without high-grade stenosis there appears to be atheromatous plaque near the origin of the inferior mesenteric artery for example on image 41 series 2 although the IMA appears to opacify suggesting that it is not overtly occluded. The portal vein, SMV, and inferior mesenteric vein appear patent. Reproductive: Uterus absent.  No adnexal abnormality. Other: No supplemental non-categorized findings. Musculoskeletal: Right upper quadrant anterior abdominal wall hernia contains adipose tissue, herniated tissue measures 1.7 by 5.4 by 4.5 cm (volume = 22 cm^3), hernia neck measures 2.7 by 2.7 cm. No herniated bowel. Improvement in prior pelvic floor laxity status post levator plication in September 2024. Similar pattern of S shaped thoracolumbar scoliosis compared to prior exams, with associated grade 1 anterolisthesis at L4-5. Spondylosis and degenerative disc disease result in right foraminal impingement at L1-2, L2-3, and L5-S1; and left foraminal impingement at L3-4, L4-5, and especially L5-S1. IMPRESSION: 1. Moderate abnormal colon wall thickening in the descending colon with striking abnormal wall thickening in the sigmoid colon and rectum, with substantial perirectal stranding and edema. This pattern of wall thickening has been present on some prior exams, most notably the CT scan 03/31/2022. Although there is some atherosclerosis including plaque at the origin of the inferior mesenteric artery, the IMA is not overtly occluded or narrowed  beyond this area of plaque, in the inferior mesenteric vein appears patent. It is conceivable that a hypotensive episode might reduce flow in the IMA enough to cause a transient ischemia that could lead to this distribution of inflammation, although this is speculative and there are no specific indicators of ischemia such as pneumatosis or portal venous gas. Other possibilities include recurrent infectious colitis; inflammatory bowel disease; or pseudomembranous colitis if the patient has been on recent antibiotic therapy. 2. Inflammatory findings in the perirectal region are associated with substantial presacral edema and potentially a trace amount of pelvic ascites. 3. Moderate sigmoid colon diverticulosis without focal indicator of acute diverticulitis. 4. Stable moderate cardiomegaly. 5. Small type 1 hiatal hernia. 6. Chronic right upper quadrant anterior abdominal wall hernia contains adipose tissue. 7. Improvement in prior pelvic floor laxity status post levator plication in September 2024. 8. S-shaped thoracolumbar scoliosis with associated grade 1 anterolisthesis at L4-5. Spondylosis and degenerative disc disease result in right foraminal impingement at L1-2, L2-3, and L5-S1; and left foraminal impingement at L3-4, L4-5, and especially L5-S1. 9. Aortic atherosclerosis. Aortic Atherosclerosis (ICD10-I70.0). Electronically Signed   By: Gaylyn Rong M.D.   On: 04/05/2023 16:53   DG Chest 2 View  Result Date: 04/05/2023 CLINICAL DATA:  Syncope EXAM: CHEST - 2 VIEW COMPARISON:  None Available. FINDINGS: Left shoulder reef placement. Mild cardiomegaly. No acute airspace disease, pleural effusion or pneumothorax. Aortic atherosclerosis. Scoliosis of the spine IMPRESSION: 1. Cardiomegaly. 2. No radiographic evidence for acute cardiopulmonary abnormality Electronically Signed   By: Jasmine Pang M.D.   On: 04/05/2023 15:09    Microbiology: Results for orders placed or performed during the hospital  encounter of 04/05/23  Blood culture (routine x 2)     Status: None (Preliminary result)   Collection Time: 04/05/23  8:45 PM   Specimen: BLOOD  Result Value Ref Range Status   Specimen Description BLOOD RIGHT ANTECUBITAL  Final   Special Requests   Final    BOTTLES DRAWN AEROBIC AND ANAEROBIC Blood Culture adequate volume   Culture   Final    NO GROWTH 4 DAYS Performed at [facility], [address] [place], [place] [address]    Report Status PENDING  Incomplete  Blood culture (routine x 2)     Status: None (Preliminary result)   Collection Time: 04/05/23  8:53 PM   Specimen: BLOOD RIGHT WRIST  Result Value Ref Range Status   Specimen Description BLOOD RIGHT WRIST  Final   Special Requests   Final    BOTTLES DRAWN AEROBIC AND ANAEROBIC Blood Culture adequate volume   Culture   Final    NO GROWTH 4 DAYS Performed at [facility], [address], [place], [place] [address]    Report Status PENDING  Incomplete  Gastrointestinal Panel by PCR , Stool     Status: None   Collection Time: 04/06/23  6:00 AM   Specimen: Stool  Result Value Ref Range Status   Campylobacter species NOT DETECTED NOT DETECTED Final   Plesimonas shigelloides NOT DETECTED NOT DETECTED Final   Salmonella species NOT DETECTED NOT DETECTED Final   Yersinia enterocolitica NOT DETECTED NOT DETECTED Final   Vibrio species NOT DETECTED NOT DETECTED Final   Vibrio cholerae NOT DETECTED NOT DETECTED Final   Enteroaggregative E coli (EAEC) NOT DETECTED NOT DETECTED Final   Enteropathogenic E coli (EPEC) NOT DETECTED NOT DETECTED Final   Enterotoxigenic E coli (ETEC) NOT DETECTED NOT DETECTED Final   Shiga like toxin producing E coli (STEC) NOT DETECTED NOT DETECTED Final   Shigella/Enteroinvasive E coli (EIEC) NOT DETECTED NOT DETECTED Final   Cryptosporidium NOT DETECTED NOT DETECTED Final   Cyclospora cayetanensis NOT DETECTED NOT DETECTED Final   Entamoeba histolytica NOT DETECTED NOT DETECTED Final    Giardia lamblia NOT DETECTED NOT DETECTED Final   Adenovirus F40/41 NOT DETECTED NOT DETECTED Final   Astrovirus NOT DETECTED NOT DETECTED Final   Norovirus GI/GII NOT DETECTED NOT DETECTED Final   Rotavirus A NOT DETECTED NOT DETECTED Final   Sapovirus (I, II, IV, and V) NOT DETECTED NOT DETECTED Final    Comment: Performed at [facility], [address] [place], [place] [address]  C Difficile Quick Screen w PCR reflex     Status: None   Collection Time: 04/06/23  6:00 AM   Specimen: Stool  Result Value Ref Range Status   C Diff antigen NEGATIVE NEGATIVE Final   C Diff toxin NEGATIVE NEGATIVE Final   C Diff interpretation No C. difficile detected.  Final    Comment: Performed at [facility], [address], [place], [place] [address]  Surgical pcr screen     Status: None  Collection Time: 04/07/23 10:02 PM   Specimen: Nasal Mucosa; Nasal Swab  Result Value Ref Range Status   MRSA, PCR NEGATIVE NEGATIVE Final   Staphylococcus aureus NEGATIVE NEGATIVE Final    Comment: (NOTE) The Xpert SA Assay (FDA approved for NASAL specimens in patients 63 years of age and older), is one component of a comprehensive surveillance program. It is not intended to diagnose infection nor to guide or monitor treatment. Performed at [facility], [address] [place], [place] [address]     Labs: CBC: Recent Labs  Lab 04/05/23 1232 04/06/23 0305 04/08/23 0706  WBC 6.7 6.5 4.7  NEUTROABS 4.7  --   --   HGB 13.1 14.2 12.0  HCT 39.6 42.3 34.6*  MCV 96.8 95.3 93.8  PLT 123* 119* 105*   Basic Metabolic Panel: Recent Labs  Lab 04/05/23 1232 04/06/23 0305 04/08/23 0706 04/09/23 0704  NA 136 137 141 138  K 4.2 3.8 3.3* 3.7  CL 101 103 109 108  CO2 26 25 26 23   GLUCOSE 137* 128* 120* 113*  BUN 13 14 10 8   CREATININE 0.79 1.05* 1.05* 0.84  CALCIUM 9.5 9.3 9.2 9.1   Liver Function Tests: Recent Labs  Lab 04/05/23 1232 04/08/23 0706  AST 24 22  ALT 13 13   ALKPHOS 60 55  BILITOT 0.7 0.9  PROT 7.0 6.6  ALBUMIN 3.4* 3.0*   CBG: No results for input(s): "GLUCAP" in the last 168 hours.  Discharge time spent: greater than 30 minutes.  Signed: [name], MD [facility] 04/09/2023

## 2023-04-08 NOTE — Anesthesia Postprocedure Evaluation (Signed)
Anesthesia Post Note  Patient: Tracy Hunt  Procedure(s) Performed: FLEXIBLE SIGMOIDOSCOPY BIOPSY     Patient location during evaluation: PACU Anesthesia Type: MAC Level of consciousness: awake and alert Pain management: pain level controlled Vital Signs Assessment: post-procedure vital signs reviewed and stable Respiratory status: spontaneous breathing, nonlabored ventilation, respiratory function stable and patient connected to nasal cannula oxygen Cardiovascular status: stable and blood pressure returned to baseline Postop Assessment: no apparent nausea or vomiting Anesthetic complications: no  No notable events documented.  Last Vitals:  Vitals:   04/08/23 1125 04/08/23 1153  BP: (!) 148/79 (!) 170/84  Pulse: 60 60  Resp: 16   Temp:  36.9 C  SpO2: 97% 99%    Last Pain:  Vitals:   04/08/23 1153  TempSrc: Oral  PainSc:                  [name]

## 2023-04-08 NOTE — Op Note (Addendum)
[facility] Patient Name: Tracy Hunt Procedure Date : 04/08/2023 MRN: [id number] Attending MD: Particia Lather , , [id number] Date of Birth: 10/13/1945 CSN: [id number] Age: 78 Admit Type: Inpatient Procedure:                Flexible Sigmoidoscopy Indications:              Diarrhea, Abnormal CT of the GI tract Providers:                Madelyn Brunner" Francine Graven, RN, Brion Aliment, Technician Referring MD:             Hospitalist team Medicines:                Monitored Anesthesia Care Complications:            No immediate complications. Estimated Blood Loss:     Estimated blood loss was minimal. Procedure:                Pre-Anesthesia Assessment:                           - Prior to the procedure, a History and Physical                            was performed, and patient medications and                            allergies were reviewed. The patient's tolerance of                            previous anesthesia was also reviewed. The risks                            and benefits of the procedure and the sedation                            options and risks were discussed with the patient.                            All questions were answered, and informed consent                            was obtained. Prior Anticoagulants: The patient has                            taken no anticoagulant or antiplatelet agents. ASA                            Grade Assessment: III - A patient with severe                            systemic disease. After reviewing the risks and  benefits, the patient was deemed in satisfactory                            condition to undergo the procedure.                           After obtaining informed consent, the scope was                            passed under direct vision. The [id number]-[id number] ([id number])                            Olympus endoscope was introduced through the  anus                            and advanced to the the left transverse colon. The                            flexible sigmoidoscopy was accomplished without                            difficulty. The patient tolerated the procedure                            well. Scope In: 10:48:52 AM Scope Out: 11:03:53 AM Total Procedure Duration: 0 hours 15 minutes 1 second  Findings:      Multiple diverticula were found in the sigmoid colon and descending       colon.      Localized inflammation characterized by congestion (edema), erosions and       erythema was found in the rectum, in the sigmoid colon and in the       descending colon. Biopsies were taken with a cold forceps for histology.      Non-bleeding internal hemorrhoids were found during retroflexion.      A single (solitary) fifteen mm ulcer was found in the rectum. No       bleeding was present. No stigmata of recent bleeding were seen. Biopsies       were taken with a cold forceps for histology.      Non-bleeding internal hemorrhoids were found during retroflexion. Impression:               - Diverticulosis in the sigmoid colon and in the                            descending colon.                           - Localized inflammation was found in the rectum,                            in the sigmoid colon and in the descending colon.                            Biopsied.                           -  Non-bleeding internal hemorrhoids.                           - A single (solitary) ulcer in the rectum. Biopsied.                           - Non-bleeding internal hemorrhoids. Recommendation:           - Return patient to hospital ward for ongoing care.                           - It is suspected that the patient's left sided                            colitis is likely due to ischemia or infection,                            which should improve with time.                           - Await pathology results.                           -  Continue to stay well hydrated.                           - Miralax daily to prevent constipation from                            worsening rectal ulcer.                           - Discussed with radiologist Dr. Deirdre Priest and it                            was felt that the vessels were able to be                            adequately visualized on her last CT scan. He does                            not think that a follow up CTA would beneficial to                            the patient.                           - We will arrange for outpatient GI follow up.                           - The findings and recommendations were discussed                            with the patient. Procedure Code(s):        --- Professional ---  21308, Sigmoidoscopy, flexible; with biopsy, single                            or multiple Diagnosis Code(s):        --- Professional ---                           K64.8, Other hemorrhoids                           K62.89, Other specified diseases of anus and rectum                           K52.9, Noninfective gastroenteritis and colitis,                            unspecified                           K62.6, Ulcer of anus and rectum                           R19.7, Diarrhea, unspecified                           K57.30, Diverticulosis of large intestine without                            perforation or abscess without bleeding                           R93.3, Abnormal findings on diagnostic imaging of                            other parts of digestive tract CPT copyright 2022 [employer] Association. All rights reserved. The codes documented in this report are preliminary and upon coder review may  be revised to meet current compliance requirements. Dr Particia Lather "[name]" Leonides Schanz,  04/08/2023 11:16:58 AM Number of Addenda: 0

## 2023-04-08 NOTE — Transfer of Care (Signed)
Immediate Anesthesia Transfer of Care Note  Patient: Tracy Hunt  Procedure(s) Performed: FLEXIBLE SIGMOIDOSCOPY BIOPSY  Patient Location: PACU  Anesthesia Type:MAC  Level of Consciousness: awake, alert , oriented, and patient cooperative  Airway & Oxygen Therapy: Patient Spontanous Breathing  Post-op Assessment: Report given to RN  Post vital signs: Reviewed and stable  Last Vitals:  Vitals Value Taken Time  BP 156/81 04/08/23 1110  Temp    Pulse 71 04/08/23 1113  Resp 17 04/08/23 1113  SpO2 97 % 04/08/23 1113  Vitals shown include unfiled device data.  Last Pain:  Vitals:   04/08/23 0929  TempSrc: Tympanic  PainSc: 0-No pain         Complications: No notable events documented.

## 2023-04-08 NOTE — Anesthesia Preprocedure Evaluation (Signed)
Anesthesia Evaluation  Patient identified by MRN, date of birth, ID band Patient awake    Reviewed: Allergy & Precautions, NPO status , Patient's Chart, lab work & pertinent test results  Airway Mallampati: I  TM Distance: >3 FB Neck ROM: Full    Dental  (+) Teeth Intact, Dental Advisory Given   Pulmonary neg pulmonary ROS   breath sounds clear to auscultation       Cardiovascular hypertension, Pt. on medications  Rhythm:Regular Rate:Normal     Neuro/Psych  negative psych ROS   GI/Hepatic hiatal hernia, PUD,GERD  ,,(+) Hepatitis -  Endo/Other  Hypothyroidism    Renal/GU negative Renal ROS     Musculoskeletal  (+) Arthritis ,    Abdominal   Peds  Hematology  (+) Blood dyscrasia, anemia   Anesthesia Other Findings   Reproductive/Obstetrics                             Anesthesia Physical Anesthesia Plan  ASA: 2  Anesthesia Plan: MAC   Post-op Pain Management: Minimal or no pain anticipated   Induction: Intravenous  PONV Risk Score and Plan: 0 and Propofol infusion  Airway Management Planned: Natural Airway  Additional Equipment: None  Intra-op Plan:   Post-operative Plan:   Informed Consent: I have reviewed the patients History and Physical, chart, labs and discussed the procedure including the risks, benefits and alternatives for the proposed anesthesia with the patient or authorized representative who has indicated his/her understanding and acceptance.       Plan Discussed with: CRNA  Anesthesia Plan Comments:        Anesthesia Quick Evaluation

## 2023-04-09 LAB — BASIC METABOLIC PANEL
Anion gap: 7 (ref 5–15)
BUN: 8 mg/dL (ref 8–23)
CO2: 23 mmol/L (ref 22–32)
Calcium: 9.1 mg/dL (ref 8.9–10.3)
Chloride: 108 mmol/L (ref 98–111)
Creatinine, Ser: 0.84 mg/dL (ref 0.44–1.00)
GFR, Estimated: >60 mL/min (ref >60)
Glucose, Bld: 113 mg/dL — ABNORMAL HIGH (ref 70–99)
Potassium: 3.7 mmol/L (ref 3.5–5.1)
Sodium: 138 mmol/L (ref 135–145)

## 2023-04-10 ENCOUNTER — Encounter (HOSPITAL_COMMUNITY): Payer: Self-pay | Admitting: Internal Medicine

## 2023-04-10 DIAGNOSIS — K5792 Diverticulitis of intestine, part unspecified, without perforation or abscess without bleeding: Secondary | ICD-10-CM | POA: Diagnosis not present

## 2023-04-10 LAB — CULTURE, BLOOD (ROUTINE X 2)
Culture: NO GROWTH
Culture: NO GROWTH
Special Requests: ADEQUATE
Special Requests: ADEQUATE

## 2023-04-11 ENCOUNTER — Encounter: Payer: Self-pay | Admitting: Internal Medicine

## 2023-04-11 ENCOUNTER — Other Ambulatory Visit: Payer: Self-pay | Admitting: Internal Medicine

## 2023-04-11 DIAGNOSIS — Z1231 Encounter for screening mammogram for malignant neoplasm of breast: Secondary | ICD-10-CM

## 2023-04-11 LAB — SURGICAL PATHOLOGY

## 2023-04-12 ENCOUNTER — Encounter: Payer: Self-pay | Admitting: Internal Medicine

## 2023-04-25 ENCOUNTER — Other Ambulatory Visit: Payer: Self-pay | Admitting: Obstetrics and Gynecology

## 2023-04-25 ENCOUNTER — Telehealth: Payer: Self-pay

## 2023-04-25 NOTE — Telephone Encounter (Signed)
 Is the patient able to call her insurance company and ask why the cost has increased and if there is anything that can be done like a tier reduction? It may be due to her deductible re-starting at the beginning of the year. She has limited alternatives available and its unclear if those will be covered. She can get some samples of Gemtesa  from the office in the meantime.

## 2023-04-25 NOTE — Telephone Encounter (Signed)
 Patient called with a request for an alternative medication for Gemtesa . Her cost for a day days supply is over $600. She likes Gemtesa  and is happy with her results, but cannot afford it. She only has 2 more tablets of the Gemtesa  left. Please call in alternative.

## 2023-04-26 ENCOUNTER — Other Ambulatory Visit: Payer: Self-pay

## 2023-04-26 MED ORDER — VIBEGRON 75 MG PO TABS: 75.0000 mg | ORAL_TABLET | Freq: Every day | ORAL | 2 refills | Status: DC

## 2023-04-26 NOTE — Telephone Encounter (Signed)
 Patient spoke to her insurance company this morning and it is due to her deductible. She requested her Rx be sent to her mail-order pharmacy for a lower cost.  We have provided her with a 30 days supply in the meantime.

## 2023-05-01 ENCOUNTER — Encounter: Payer: Self-pay | Admitting: Neurology

## 2023-05-01 ENCOUNTER — Ambulatory Visit (INDEPENDENT_AMBULATORY_CARE_PROVIDER_SITE_OTHER): Payer: Medicare HMO | Admitting: Neurology

## 2023-05-01 VITALS — BP 143/91 | HR 72 | Ht 63.0 in | Wt 161.0 lb

## 2023-05-01 DIAGNOSIS — G609 Hereditary and idiopathic neuropathy, unspecified: Secondary | ICD-10-CM

## 2023-05-01 MED ORDER — GABAPENTIN 300 MG PO CAPS: 300.0000 mg | ORAL_CAPSULE | Freq: Three times a day (TID) | ORAL | 3 refills | Status: DC

## 2023-05-01 MED ORDER — GABAPENTIN 100 MG PO CAPS: ORAL_CAPSULE | ORAL | 3 refills | Status: DC

## 2023-05-01 NOTE — Patient Instructions (Signed)
 It was nice to see you today.  Continue gabapentin  300mg  three times daily  At bedtime, you can take gabapentin  100-200mg  as needed.    I will see you back in 1 year

## 2023-05-01 NOTE — Progress Notes (Signed)
 Follow-up Visit   Date: 05/01/2023    Tracy Hunt MRN: [id number] DOB: [birth year]    Tracy Hunt is a 78 y.o. right-handed Caucasian female with hypertension, hypothyroidism, Sjogren's syndrome (followed by Dr. 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  The patient was accompanied to the clinic by self.  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  Risk factors:  Sjogren's disease, age.    - Continue gabapentin  300mg  TID and  - Increase gabapentin  to 100-200mg  at bedtime.  - Continue balance exercises as she is doing  - Answered questions regarding management and prognosis of neuropathy  Return to clinic in 1 year  --------------------------------------------- History of present illness: For the past 2-3 years, she has achy, burning pain, and numbness involving the feet.  Symptoms are constant, without any identifiable exacerbating or alleviating factors.  She endorses imbalance, and uses a cane only as needed on uneven ground.  No falls. She has chronic low back pain. She takes gabapentin  300mg  three times daily which helps reduce her pain, but does not alleviate it. Recent B12 levels and HbA1c were normal.  She lives with daughter in 4 level home.  Nonsmoker, does not drink alcohol .   UPDATE 01/21/2022:  She is here for follow-up visit.  She continues to have tingling and burning in the feet.  It has improved at night time after increasing her bedtime dose to 400mg .  She tried gabapentin  600mg , but felt it made her too groggy.    She had one fall after tripping on something hanging off the bed and required assistance to stand up. She has started using a cane when she is out and especially on uneven ground. She is working with a Systems analyst since September.  UPDATE 07/25/2022:  She is here for follow-up visit.  Neuropathy has been  stable.  She is taking gabapentin  300mg  three times daily + 100mg  at bedtime, which mostly controls her pain.  She is going to the gym once per week and works with a Psychologist, educational for balance.  No interval falls.  She was hospitalized in January with C difficile colitis and gout.  She completed home PT following this.   Today, she also reports having itching in the mid-back area which is very localized.    UPDATE 05/01/2023:  She is here for follow-up visit for neuropathy. She has noticed that her feet can become more painful during the evenings.  She takes gabapentin  300mg  three times daily and 100mg  at bedtime.  She is tolerating it well.  She continues to stay active and goes to the gym regularly.  No interval falls.  She would like to know if there is any special diet or if there is anything to slow the progression of neuropathy.   Medications:  Current Outpatient Medications on File Prior to Visit  Medication Sig Dispense Refill   acetaminophen  (TYLENOL ) 650 MG CR tablet Take 1,300 mg by mouth in the morning, at noon, and at bedtime. Takes 2 tablets in the morning, 2 in the afternoon, and 2 at bedtime.     Cholecalciferol  (VITAMIN D ) 50 MCG (2000 UT) tablet Take 2,000 Units by mouth daily.     COMBIGAN  0.2-0.5 % ophthalmic solution Place 1 drop into both eyes in the morning and at bedtime.     dicyclomine  (BENTYL ) 10 MG capsule Take 1 capsule (10 mg total) by mouth 3 (  three) times daily before meals. 270 capsule 2   docusate sodium  (COLACE) 100 MG capsule Take 100 mg by mouth 2 (two) times daily.     estradiol  (ESTRACE ) 0.1 MG/GM vaginal cream Place 0.5 g vaginally 2 (two) times a week. Place 0.5g nightly for two weeks then twice a week after 30 g 11   gabapentin  (NEURONTIN ) 100 MG capsule Take 1 tablet in the morning and bedtime along with 300mg  tablets. (Patient taking differently: Take 100 mg by mouth at bedtime.) 180 capsule 3   gabapentin  (NEURONTIN ) 300 MG capsule Take 1 capsule (300 mg total) by  mouth 3 (three) times daily. 480 capsule 1   levothyroxine  (SYNTHROID ) 100 MCG tablet Take 100 mcg by mouth daily before breakfast.     lipase/protease/amylase (CREON ) 36000 UNITS CPEP capsule Take 2 capsules (72,000 Units total) by mouth 3 (three) times daily with meals. 200 capsule 1   Menthol , Topical Analgesic, (BIOFREEZE EX) Apply 1 drop topically daily as needed (back pain).     Multiple Vitamins-Minerals (CENTRUM SILVER 50+WOMEN PO) Take 1 tablet by mouth daily.     olmesartan  (BENICAR ) 20 MG tablet Take 1 tablet (20 mg total) by mouth daily. 90 tablet 3   ondansetron  (ZOFRAN ) 4 MG tablet Take 1 tablet (4 mg total) by mouth every 8 (eight) hours as needed for nausea or vomiting. 10 tablet 0   polyethylene glycol (MIRALAX  / GLYCOLAX ) 17 g packet Take 17 g by mouth daily. 30 each 3   Propylene Glycol (SYSTANE COMPLETE OP) Place 1 drop into both eyes 2 (two) times daily as needed (dry eyes).     rosuvastatin  (CRESTOR ) 10 MG tablet Take 10 mg by mouth at bedtime.     saccharomyces boulardii (FLORASTOR) 250 MG capsule Take 1 capsule (250 mg total) by mouth 2 (two) times daily. 60 capsule 0   Vibegron  75 MG TABS Take 1 tablet (75 mg total) by mouth daily. 90 tablet 2   No current facility-administered medications on file prior to visit.    Allergies:  Allergies  Allergen Reactions   Cosopt  [Dorzolamide  Hcl-Timolol  Mal] Other (See Comments)    "Very irritated"   Morphine     Makes me hyper   Other Itching    Adhesive tape  Paper tape is okay   International aid/development worker, Diarrhea and Nausea And Vomiting    Oysters - cramping    Vital Signs:  BP (!) 143/91   Pulse 72   Ht 5\' 3"  (1.6 m)   Wt 161 lb (73 kg)   SpO2 97%   BMI 28.52 kg/m   Neurological Exam: MENTAL STATUS including orientation to time, place, person, recent and remote memory, attention span and concentration, language, and fund of knowledge is normal.  Speech is not dysarthric.  CRANIAL NERVES:   Normal conjugate,  extra-ocular eye movements in all directions of gaze.  No ptosis.  Face is symmetric.   MOTOR:  Motor strength is 5/5 in all extremities.  No atrophy, fasciculations or abnormal movements.  No pronator drift.  Tone is normal.    MSRs:  Reflexes are 2+/4 throughout, except 1+/4 at the ankles bilaterally.  SENSORY:   Vibration is reduced a the feet.   COORDINATION/GAIT:  Gait is mildly-wide based, stable unassisted.  Data:n/a   Thank you for allowing me to participate in patient's care.  If I can answer any additional questions, I would be pleased to do so.    Sincerely,    Claribel Sachs K. Lydia Sams,  DO

## 2023-05-09 ENCOUNTER — Ambulatory Visit: Admit: 2023-05-09 | Payer: Medicare HMO | Admitting: General Surgery

## 2023-05-09 SURGERY — REPAIR, HERNIA, VENTRAL, ROBOT-ASSISTED
Anesthesia: General

## 2023-05-12 ENCOUNTER — Ambulatory Visit: Payer: Medicare HMO | Admitting: Gastroenterology

## 2023-05-14 ENCOUNTER — Other Ambulatory Visit: Payer: Self-pay

## 2023-05-15 ENCOUNTER — Other Ambulatory Visit (HOSPITAL_COMMUNITY): Payer: Self-pay

## 2023-05-15 ENCOUNTER — Other Ambulatory Visit: Payer: Self-pay

## 2023-05-15 MED ORDER — DICYCLOMINE HCL 10 MG PO CAPS
10.0000 mg | ORAL_CAPSULE | Freq: Three times a day (TID) | ORAL | 0 refills | Status: DC
  Filled 2023-05-15: qty 270, 90d supply, fill #0

## 2023-05-17 ENCOUNTER — Encounter: Payer: Self-pay | Admitting: Internal Medicine

## 2023-05-17 ENCOUNTER — Ambulatory Visit (INDEPENDENT_AMBULATORY_CARE_PROVIDER_SITE_OTHER): Payer: Medicare HMO | Admitting: Internal Medicine

## 2023-05-17 VITALS — BP 126/70 | HR 78 | Ht 63.0 in | Wt 166.0 lb

## 2023-05-17 DIAGNOSIS — K8689 Other specified diseases of pancreas: Secondary | ICD-10-CM

## 2023-05-17 DIAGNOSIS — Z8719 Personal history of other diseases of the digestive system: Secondary | ICD-10-CM

## 2023-05-17 DIAGNOSIS — K626 Ulcer of anus and rectum: Secondary | ICD-10-CM

## 2023-05-17 DIAGNOSIS — K5900 Constipation, unspecified: Secondary | ICD-10-CM

## 2023-05-17 NOTE — Progress Notes (Unsigned)
 05/17/2023 [place] [id number] Jan 09, 1946  Referring provider: Chilton Greathouse, MD Primary GI doctor: Dr. Russella Dar  ASSESSMENT AND PLAN:     Gastrointestinal Symptoms History colitis History of solitary rectal ulcer Episodes of constipation followed by diarrhea and weakness. No recent antibiotics or weight loss. Currently on Colace and dicyclomine.  Likely overflow diarrhea worsened with recent dicyclomine use daily. -Miralax PRN - Referral to pelvic floor PT with Arnette Norris -Continue high fiber diet. -RTC in 1 year  Pancreatic Insufficiency On Creon for pancreatic enzyme replacement. -Continue Creon with meals. -Provide patient education on the use of Creon with meals and snacks.  Follow-up appointment to be scheduled.      Patient Care Team: Chilton Greathouse, MD as PCP - General (Internal Medicine) Glendale Chard, DO as Consulting Physician (Neurology)  HISTORY OF PRESENT ILLNESS: 78 y.o. female with a past medical history of pancreatic insufficiency, IBS, history of diverticulitis, C. difficile colitis and others listed below presents for evaluation of stomach pain, diarrhea and cramping.   2021 Colon with Dr. Loreta Ave 3 mm polyp and diverticulosis 05/19/2022 office visit with Dr. Russella Dar for follow-up of C. difficile colitis initially treated with vancomycin but had ongoing fever treated with 10-day course of Dificid.  Given trial of dicyclomine. 12/05/2022 i-STAT chemical with Hgb 14.3, BUN 17, creatinine 0.70, potassium 3.9  CT A/P w/contrast 04/05/23: IMPRESSION: 1. Moderate abnormal colon wall thickening in the descending colon with striking abnormal wall thickening in the sigmoid colon and rectum, with substantial perirectal stranding and edema. This pattern of wall thickening has been present on some prior exams, most notably the CT scan 03/31/2022. Although there is some atherosclerosis including plaque at the origin of the inferior mesenteric  artery, the IMA is not overtly occluded or narrowed beyond this area of plaque, in the inferior mesenteric vein appears patent. It is conceivable that a hypotensive episode might reduce flow in the IMA enough to cause a transient ischemia that could lead to this distribution of inflammation, although this is speculative and there are no specific indicators of ischemia such as pneumatosis or portal venous gas. Other possibilities include recurrent infectious colitis; inflammatory bowel disease; or pseudomembranous colitis if the patient has been on recent antibiotic therapy. 2. Inflammatory findings in the perirectal region are associated with substantial presacral edema and potentially a trace amount of pelvic ascites. 3. Moderate sigmoid colon diverticulosis without focal indicator of acute diverticulitis. 4. Stable moderate cardiomegaly. 5. Small type 1 hiatal hernia. 6. Chronic right upper quadrant anterior abdominal wall hernia contains adipose tissue. 7. Improvement in prior pelvic floor laxity status post levator plication in September 2024. 8. S-shaped thoracolumbar scoliosis with associated grade 1 anterolisthesis at L4-5. Spondylosis and degenerative disc disease result in right foraminal impingement at L1-2, L2-3, and L5-S1; and left foraminal impingement at L3-4, L4-5, and especially L5-S1. 9. Aortic atherosclerosis.  Flex sig 04/08/23: - Diverticulosis in the sigmoid colon and in the descending colon. - Localized inflammation was found in the rectum, in the sigmoid colon and in the descending colon. Biopsied. - Non- bleeding internal hemorrhoids. - A single ( solitary) ulcer in the rectum. Biopsied. - Non- bleeding internal hemorrhoids. Path: A. COLON, BIOPSY:      Active colitis with marked reactive epithelial changes.      Negative for chronicity, granuloma, dysplasia or malignancy.      See comment. B. RECTUM, ULCER, BIOPSY:      Active colitis with marked reactive  epithelial changes.  Negative for chronicity, granuloma, dysplasia or malignancy.      See comment. COMMENT: The biopsies are composed of fragments of colonic mucosa with cryptitis with reactive epithelial changes. There is no evidence of chronicity including crypt architectural distortion, basilar lymphoplasmacytosis or metaplasia. The etiology considerations include infection, medication effect, diverticular disease associated colitis, ischemic colitis, et al. The possibility of inflammatory bowel disease is relatively low but cannot be entirely excluded. Clinical correlation is recommended.   She has had 3 episodes in last few weeks with constipation, nausea, AB pain and then she will have diarrhea. She will have weakness, rest for a while and feel better. This was similar to what was happening before her Cdiff colitis.  She is on colace in the morning, otherwise she does not take anything.  She has tried diet to help her bowel.  She takes the dicyclomine and creon with every time she eats. She has had some AB bloating/burping last few days.  She has gained about 4 lbs, no weight loss.  No fever, chills, no ABX.   Discussed the use of AI scribe software for clinical note transcription with the patient, who gave verbal consent to proceed.  History of Present Illness   The patient, with a history of gastrointestinal issues including a recent C. diff infection, presents with intermittent episodes of constipation followed by diarrhea and associated weakness. These symptoms are reminiscent of those experienced prior to her previous gastrointestinal complications. The patient has been managing her bowel movements primarily through dietary modifications and the use of Colace. She reports that these episodes have occurred approximately three times in the past few months.  The patient is currently on a regimen of dicyclomine, taken three times a day before meals, and Creon, taken before  breakfast, lunch, and dinner. She reports that the dicyclomine has been helpful. She has also been experiencing bloating after meals in recent days, which is a new symptom for her. Despite these gastrointestinal issues, the patient has gained about four pounds and denies any fevers, chills, or recent antibiotic use.  The patient underwent surgery in September, for cystocele and rectocele. She has questions about the use of Creon, particularly in relation to meal frequency and size. She also expresses concerns about her pancreas function and whether further checks are necessary. The patient's diet is high in fiber, consisting of cooked vegetables, fruits, and oatmeal. She reports that her bowel movements are usually formed and soft, but she has been experiencing some irregularities.   Interval History: *** She has not seen any diarrhea for a few weeks. Denies blood in the stools. She has not really been taking the Miralax daily because she felt like she has not needed it. If she gets constipated, she will use Miralax PRN. She will on average have one BM every 2-3 days. Taking Miralax once per day cause too many BMs. She been trying really well hydrated. She drinks 8-10 glasses of water per day. She is eating prunes, pears, bananas, and blueberries every day. Denies abdominal pain. She is still taking her Creon.      She  reports that she has never smoked. She has never used smokeless tobacco. She reports that she does not currently use alcohol. She reports that she does not use drugs.  RELEVANT LABS AND IMAGING:  CBC    Component Value Date/Time   WBC 4.7 04/08/2023 0706   RBC 3.69 (L) 04/08/2023 0706   HGB 12.0 04/08/2023 0706   HCT 34.6 (L) 04/08/2023 1610  PLT 105 (L) 04/08/2023 0706   MCV 93.8 04/08/2023 0706   MCH 32.5 04/08/2023 0706   MCHC 34.7 04/08/2023 0706   RDW 12.2 04/08/2023 0706   LYMPHSABS 1.3 04/05/2023 1232   MONOABS 0.6 04/05/2023 1232   EOSABS 0.1 04/05/2023 1232    BASOSABS 0.0 04/05/2023 1232   Recent Labs    10/23/22 1117 12/05/22 1014 02/14/23 1210 04/05/23 1232 04/06/23 0305 04/08/23 0706  HGB 14.2 14.3 13.1 13.1 14.2 12.0    CMP     Component Value Date/Time   NA 138 04/09/2023 0704   K 3.7 04/09/2023 0704   CL 108 04/09/2023 0704   CO2 23 04/09/2023 0704   GLUCOSE 113 (H) 04/09/2023 0704   BUN 8 04/09/2023 0704   CREATININE 0.84 04/09/2023 0704   CALCIUM 9.1 04/09/2023 0704   PROT 6.6 04/08/2023 0706   ALBUMIN 3.0 (L) 04/08/2023 0706   AST 22 04/08/2023 0706   ALT 13 04/08/2023 0706   ALKPHOS 55 04/08/2023 0706   BILITOT 0.9 04/08/2023 0706   GFRNONAA >60 04/09/2023 0704      Latest Ref Rng & Units 04/08/2023    7:06 AM 04/05/2023   12:32 PM 02/14/2023   12:10 PM  Hepatic Function  Total Protein 6.5 - 8.1 g/dL 6.6  7.0  7.6   Albumin 3.5 - 5.0 g/dL 3.0  3.4  4.0   AST 15 - 41 U/L 22  24  15    ALT 0 - 44 U/L 13  13  8    Alk Phosphatase 38 - 126 U/L 55  60  68   Total Bilirubin 0.0 - 1.2 mg/dL 0.9  0.7  0.4   Bilirubin, Direct 0.0 - 0.2 mg/dL  0.2        Current Medications:   Current Outpatient Medications (Endocrine & Metabolic):    levothyroxine (SYNTHROID) 100 MCG tablet, Take 100 mcg by mouth daily before breakfast.  Current Outpatient Medications (Cardiovascular):    olmesartan (BENICAR) 20 MG tablet, Take 1 tablet (20 mg total) by mouth daily.   rosuvastatin (CRESTOR) 10 MG tablet, Take 10 mg by mouth at bedtime.   Current Outpatient Medications (Analgesics):    acetaminophen (TYLENOL) 650 MG CR tablet, Take 1,300 mg by mouth in the morning, at noon, and at bedtime. Takes 2 tablets in the morning, 2 in the afternoon, and 2 at bedtime.   Current Outpatient Medications (Other):    Cholecalciferol (VITAMIN D) 50 MCG (2000 UT) tablet, Take 2,000 Units by mouth daily.   COMBIGAN 0.2-0.5 % ophthalmic solution, Place 1 drop into both eyes in the morning and at bedtime.   dicyclomine (BENTYL) 10 MG capsule, Take  1 capsule (10 mg total) by mouth 3 (three) times daily before meals.   docusate sodium (COLACE) 100 MG capsule, Take 100 mg by mouth 2 (two) times daily.   estradiol (ESTRACE) 0.1 MG/GM vaginal cream, Place 0.5 g vaginally 2 (two) times a week. Place 0.5g nightly for two weeks then twice a week after   gabapentin (NEURONTIN) 100 MG capsule, Take 2 tablets at bedtime along with 300mg  tablets.   gabapentin (NEURONTIN) 300 MG capsule, Take 1 capsule (300 mg total) by mouth 3 (three) times daily.   lipase/protease/amylase (CREON) 36000 UNITS CPEP capsule, Take 2 capsules (72,000 Units total) by mouth 3 (three) times daily with meals.   Menthol, Topical Analgesic, (BIOFREEZE EX), Apply 1 drop topically daily as needed (back pain).   Multiple Vitamins-Minerals (CENTRUM SILVER 50+WOMEN PO), Take 1  tablet by mouth daily.   ondansetron (ZOFRAN) 4 MG tablet, Take 1 tablet (4 mg total) by mouth every 8 (eight) hours as needed for nausea or vomiting.   polyethylene glycol (MIRALAX / GLYCOLAX) 17 g packet, Take 17 g by mouth daily.   Propylene Glycol (SYSTANE COMPLETE OP), Place 1 drop into both eyes 2 (two) times daily as needed (dry eyes).   saccharomyces boulardii (FLORASTOR) 250 MG capsule, Take 1 capsule (250 mg total) by mouth 2 (two) times daily.   Vibegron 75 MG TABS, Take 1 tablet (75 mg total) by mouth daily.  Medical History:  Past Medical History:  Diagnosis Date   Acid reflux    Anemia    Arthritis    Bell's palsy 1988   C. difficile diarrhea 03/2022   Complication of anesthesia    Hard to wake up after a  colonoscopy before  around 2000   Diverticulitis    Dry eye    Family history of adverse reaction to anesthesia    mom hard to wake up and had lethargy after anesthesia   Glaucoma    Gout    Hepatitis 1986   secondary to varicella was in hospital for 2 days   Hiatal hernia    Hyperlipemia    Hypertension    Hypothyroidism    Malaria    as as child   Mumps    Pre-diabetes     Rosacea    Scoliosis    Sjogren's syndrome (HCC)    Allergies:  Allergies  Allergen Reactions   Cosopt [Dorzolamide Hcl-Timolol Mal] Other (See Comments)    "Very irritated"   Morphine     Makes me hyper   Other Itching    Adhesive tape  Paper tape is okay   Oyster Shell Hives, Diarrhea and Nausea And Vomiting    Oysters - cramping     Surgical History:  She  has a past surgical history that includes Abdominal hysterectomy (1995); Appendectomy (1995); Joint replacement (2012); Cholecystectomy (2013); steroid injections; Eye surgery (Bilateral); Joint replacement; Reverse shoulder arthroplasty (Left, 10/23/2020); Cataract extraction (Bilateral); lumbar radial frequency ablation (2014); Colpocleisis (N/A, 12/05/2022); Rectocele repair (N/A, 12/05/2022); Cystoscopy (N/A, 12/05/2022); Flexible sigmoidoscopy (N/A, 04/08/2023); and biopsy (04/08/2023). Family History:  Her family history includes Breast cancer in her paternal grandmother; CVA in her sister; Colon cancer in her mother; Dementia in her sister; Diabetes in her father; Hypertension in her mother; Lung cancer in her mother.  REVIEW OF SYSTEMS  : All other systems reviewed and negative except where noted in the History of Present Illness.  PHYSICAL EXAM: BP 126/70   Pulse 78   Ht 5\' 3"  (1.6 m)   Wt 166 lb (75.3 kg)   BMI 29.41 kg/m  General Appearance: Well nourished, in no apparent distress. Head:   Normocephalic and atraumatic. Eyes:  sclerae anicteric,conjunctive pink  Respiratory: Respiratory effort normal, BS equal bilaterally without rales, rhonchi, wheezing. Cardio: RRR with no MRGs. Peripheral pulses intact.  Abdomen: Soft,  Obese ,active bowel sounds. No tenderness . No masses. Rectal: Not evaluated Musculoskeletal: Full ROM, Normal gait. Without edema. Skin:  Dry and intact without significant lesions or rashes Neuro: Alert and  oriented x4;  No focal deficits. Psych:  Cooperative. Normal mood and affect.     Imogene Burn, MD 2:39 PM

## 2023-05-17 NOTE — Patient Instructions (Addendum)
 A referral was place for pelvic for therapy they will contact you to schedule appointment.  Aim to have 1 bowel movement per day with Miralax  Follow up in 1 year   If your blood pressure at your visit was 140/90 or greater, please contact your primary care physician to follow up on this.  _______________________________________________________  If you are age 78 or older, your body mass index should be between 23-30. Your Body mass index is 29.41 kg/m. If this is out of the aforementioned range listed, please consider follow up with your Primary Care Provider.  If you are age 10 or younger, your body mass index should be between 19-25. Your Body mass index is 29.41 kg/m. If this is out of the aformentioned range listed, please consider follow up with your Primary Care Provider.   ________________________________________________________  The [facility] providers would like to encourage you to use [facility] to communicate with providers for non-urgent requests or questions.  Due to long hold times on the telephone, sending your provider a message by [facility] may be a faster and more efficient way to get a response.  Please allow 48 business hours for a response.  Please remember that this is for non-urgent requests.  _______________________________________________________  Thank you for entrusting me with your care and for choosing [facility], Dr. Eulah Pont

## 2023-05-30 ENCOUNTER — Other Ambulatory Visit (HOSPITAL_COMMUNITY): Payer: Self-pay

## 2023-05-30 DIAGNOSIS — D2271 Melanocytic nevi of right lower limb, including hip: Secondary | ICD-10-CM | POA: Diagnosis not present

## 2023-05-30 DIAGNOSIS — Z85828 Personal history of other malignant neoplasm of skin: Secondary | ICD-10-CM | POA: Diagnosis not present

## 2023-05-30 DIAGNOSIS — L814 Other melanin hyperpigmentation: Secondary | ICD-10-CM | POA: Diagnosis not present

## 2023-05-30 DIAGNOSIS — L570 Actinic keratosis: Secondary | ICD-10-CM | POA: Diagnosis not present

## 2023-06-02 ENCOUNTER — Ambulatory Visit
Admission: RE | Admit: 2023-06-02 | Discharge: 2023-06-02 | Disposition: A | Discharge status: Home / Self Care | Payer: Medicare HMO | Source: Ambulatory Visit | Attending: Internal Medicine | Admitting: Internal Medicine

## 2023-06-02 DIAGNOSIS — Z1231 Encounter for screening mammogram for malignant neoplasm of breast: Secondary | ICD-10-CM | POA: Diagnosis not present

## 2023-06-02 NOTE — Pre-Procedure Instructions (Signed)
 Surgical Instructions   Your procedure is scheduled on June 13, 2023. Report to [facility] Main Entrance "A" at 5:30 A.M., then check in with the Admitting office. Any questions or running late day of surgery: call [phone]  Questions prior to your surgery date: call [phone], Monday-Friday, 8am-4pm. If you experience any cold or flu symptoms such as cough, fever, chills, shortness of breath, etc. between now and your scheduled surgery, please notify us at the above number.     Remember:  Do not eat after midnight the night before your surgery   You may drink clear liquids until 4:30 AM the morning of your surgery.   Clear liquids allowed are: Water, Non-Citrus Juices (without pulp), Carbonated Beverages, Clear Tea (no milk, honey, etc.), Black Coffee Only (NO MILK, CREAM OR POWDERED CREAMER of any kind), and Gatorade.    Take these medicines the morning of surgery with A SIP OF WATER: acetaminophen (TYLENOL)  COMBIGAN ophthalmic solution  dicyclomine (BENTYL)  docusate sodium (COLACE)  gabapentin (NEURONTIN)  levothyroxine (SYNTHROID)  Vibegron    May take these medicines IF NEEDED: ondansetron (ZOFRAN)  Propylene Glycol (SYSTANE COMPLETE OP) eye drops   One week prior to surgery, STOP taking any Aspirin (unless otherwise instructed by your surgeon) Aleve, Naproxen, Ibuprofen, Motrin, Advil, Goody's, BC's, all herbal medications, fish oil, and non-prescription vitamins.                     Do NOT Smoke (Tobacco/Vaping) for 24 hours prior to your procedure.  If you use a CPAP at night, you may bring your mask/headgear for your overnight stay.   You will be asked to remove any contacts, glasses, piercing's, hearing aid's, dentures/partials prior to surgery. Please bring cases for these items if needed.    Patients discharged the day of surgery will not be allowed to drive home, and someone needs to stay with them for 24 hours.  SURGICAL WAITING ROOM  VISITATION Patients may have no more than 2 support people in the waiting area - these visitors may rotate.   Pre-op nurse will coordinate an appropriate time for 1 ADULT support person, who may not rotate, to accompany patient in pre-op.  Children under the age of 22 must have an adult with them who is not the patient and must remain in the main waiting area with an adult.  If the patient needs to stay at the hospital during part of their recovery, the visitor guidelines for inpatient rooms apply.  Please refer to the [facility] website for the visitor guidelines for any additional information.   If you received a COVID test during your pre-op visit  it is requested that you wear a mask when out in public, stay away from anyone that may not be feeling well and notify your surgeon if you develop symptoms. If you have been in contact with anyone that has tested positive in the last 10 days please notify you surgeon.      Pre-operative CHG Bathing Instructions   You can play a key role in reducing the risk of infection after surgery. Your skin needs to be as free of germs as possible. You can reduce the number of germs on your skin by washing with CHG (chlorhexidine gluconate) soap before surgery. CHG is an antiseptic soap that kills germs and continues to kill germs even after washing.   DO NOT use if you have an allergy to chlorhexidine/CHG or antibacterial soaps. If your skin becomes reddened  or irritated, stop using the CHG and notify one of our RNs at [phone].              TAKE A SHOWER THE NIGHT BEFORE SURGERY AND THE DAY OF SURGERY    Please keep in mind the following:  DO NOT shave, including legs and underarms, 48 hours prior to surgery.   You may shave your face before/day of surgery.  Place clean sheets on your bed the night before surgery Use a clean washcloth (not used since being washed) for each shower. DO NOT sleep with pet's night before surgery.  CHG Shower  Instructions:  Wash your face and private area with normal soap. If you choose to wash your hair, wash first with your normal shampoo.  After you use shampoo/soap, rinse your hair and body thoroughly to remove shampoo/soap residue.  Turn the water OFF and apply half the bottle of CHG soap to a CLEAN washcloth.  Apply CHG soap ONLY FROM YOUR NECK DOWN TO YOUR TOES (washing for 3-5 minutes)  DO NOT use CHG soap on face, private areas, open wounds, or sores.  Pay special attention to the area where your surgery is being performed.  If you are having back surgery, having someone wash your back for you may be helpful. Wait 2 minutes after CHG soap is applied, then you may rinse off the CHG soap.  Pat dry with a clean towel  Put on clean pajamas    Additional instructions for the day of surgery: DO NOT APPLY any lotions, deodorants, cologne, or perfumes.   Do not wear jewelry or makeup Do not wear nail polish, gel polish, artificial nails, or any other type of covering on natural nails (fingers and toes) Do not bring valuables to the hospital. [facility] is not responsible for valuables/personal belongings. Put on clean/comfortable clothes.  Please brush your teeth.  Ask your nurse before applying any prescription medications to the skin.

## 2023-06-05 ENCOUNTER — Encounter (HOSPITAL_COMMUNITY)
Admission: RE | Admit: 2023-06-05 | Discharge: 2023-06-05 | Disposition: A | Discharge status: Home / Self Care | Source: Ambulatory Visit | Attending: General Surgery | Admitting: General Surgery

## 2023-06-05 ENCOUNTER — Encounter (HOSPITAL_COMMUNITY): Payer: Self-pay

## 2023-06-05 ENCOUNTER — Other Ambulatory Visit: Payer: Self-pay

## 2023-06-05 ENCOUNTER — Ambulatory Visit: Payer: Self-pay | Admitting: General Surgery

## 2023-06-05 VITALS — BP 158/78 | HR 63 | Temp 97.8°F | Resp 17 | Ht 63.0 in | Wt 166.6 lb

## 2023-06-05 DIAGNOSIS — Z01818 Encounter for other preprocedural examination: Secondary | ICD-10-CM | POA: Diagnosis present

## 2023-06-05 DIAGNOSIS — K769 Liver disease, unspecified: Secondary | ICD-10-CM | POA: Insufficient documentation

## 2023-06-05 DIAGNOSIS — Z01812 Encounter for preprocedural laboratory examination: Secondary | ICD-10-CM | POA: Insufficient documentation

## 2023-06-05 LAB — COMPREHENSIVE METABOLIC PANEL
ALT: 14 U/L (ref 0–44)
AST: 21 U/L (ref 15–41)
Albumin: 3.6 g/dL (ref 3.5–5.0)
Alkaline Phosphatase: 69 U/L (ref 38–126)
Anion gap: 8 (ref 5–15)
BUN: 13 mg/dL (ref 8–23)
CO2: 27 mmol/L (ref 22–32)
Calcium: 9.7 mg/dL (ref 8.9–10.3)
Chloride: 100 mmol/L (ref 98–111)
Creatinine, Ser: 0.69 mg/dL (ref 0.44–1.00)
GFR, Estimated: >60 mL/min (ref >60)
Glucose, Bld: 91 mg/dL (ref 70–99)
Potassium: 4.2 mmol/L (ref 3.5–5.1)
Sodium: 135 mmol/L (ref 135–145)
Total Bilirubin: 0.6 mg/dL (ref 0.0–1.2)
Total Protein: 7.6 g/dL (ref 6.5–8.1)

## 2023-06-05 LAB — CBC
HCT: 39.9 % (ref 36.0–46.0)
Hemoglobin: 13.1 g/dL (ref 12.0–15.0)
MCH: 31.7 pg (ref 26.0–34.0)
MCHC: 32.8 g/dL (ref 30.0–36.0)
MCV: 96.6 fL (ref 80.0–100.0)
Platelets: 156 10*3/uL (ref 150–400)
RBC: 4.13 MIL/uL (ref 3.87–5.11)
RDW: 13.1 % (ref 11.5–15.5)
WBC: 4.1 10*3/uL (ref 4.0–10.5)
nRBC: 0 % (ref 0.0–0.2)

## 2023-06-05 NOTE — Progress Notes (Signed)
 PCP - Dr. Chilton [place] Cardiologist - Dr. Kristeen Miss - Last office visit 12/23/2022 with PRN follow-up  PPM/ICD - Denies Device Orders - n/a Rep Notified - n/a  Chest x-ray - 04/05/2023 EKG - 04/05/2023 Stress Test - Per pt, many years ago. Result normal ECHO - Denies Cardiac Cath - Denies  Sleep Study - Denies CPAP - n/a  No DM  Last dose of GLP1 agonist- n/a GLP1 instructions: n/a  Blood Thinner Instructions: n/a Aspirin Instructions: n/a  ERAS Protcol - Clear liquids until 0430 morning of surgery PRE-SURGERY Ensure or G2- n/a  COVID TEST- n/a   Anesthesia review: No  Patient denies shortness of breath, fever, cough and chest pain at PAT appointment. Pt denies any respiratory illness/infection in the last two months.    All instructions explained to the patient, with a verbal understanding of the material. Patient agrees to go over the instructions while at home for a better understanding. Patient also instructed to self quarantine after being tested for COVID-19. The opportunity to ask questions was provided.

## 2023-06-05 NOTE — H&P (Signed)
 Chief Complaint: New Consultation       History of Present Illness: Tracy Hunt is a 78 y.o. female who is seen today as an office consultation at the request of Dr. Nedra Hai for evaluation of New Consultation .   Patient is a 78 year old female who comes in secondary to an incisional hernia in the right upper quadrant.  Patient states that she had a lap chole done in the past.  States that she had a very large gallbladder at this time.  She states that she has not some pain discomfort right upper quadrant.  Patient was hospitalized the beginning of this year secondary to significant GI symptoms.  Patient underwent CT scan.  Patient found to have an incisional hernia in the right upper quadrant.  She states that she is having more pain discomfort has been ongoing.  States it is very sporadic.   She has noted some asymmetry of her abdominal wall.   CT scan I reviewed that showed a 3.6 cm fat-containing incisional hernia.  This appears to be at the extraction point of the right upper quadrant hernia.               Review of Systems: A complete review of systems was obtained from the patient.  I have reviewed this information and discussed as appropriate with the patient.  See HPI as well for other ROS.   Review of Systems  Constitutional:  Negative for fever.  HENT:  Negative for congestion.   Eyes:  Negative for blurred vision.  Respiratory:  Negative for cough, shortness of breath and wheezing.   Cardiovascular:  Negative for chest pain and palpitations.  Gastrointestinal:  Negative for heartburn.  Genitourinary:  Negative for dysuria.  Musculoskeletal:  Negative for myalgias.  Skin:  Negative for rash.  Neurological:  Negative for dizziness and headaches.  Psychiatric/Behavioral:  Negative for depression and suicidal ideas.   All other systems reviewed and are negative.       Medical History: Past Medical History Past Medical History: Diagnosis Date  Arthritis    GERD  (gastroesophageal reflux disease)    Glaucoma (increased eye pressure)    Hyperlipidemia    Hypertension    Thyroid disease         Problem List There is no problem list on file for this patient.     Past Surgical History Past Surgical History: Procedure Laterality Date  APPENDECTOMY      CATARACT EXTRACTION      CHOLECYSTECTOMY      COLPOCLEISIS      HYSTERECTOMY      JOINT REPLACEMENT      Shoulder surgery          Allergies Allergies Allergen Reactions  Shellfish Derived Diarrhea     Only oysters  Morphine Other (See Comments)     Makes me hyper      Medications Ordered Prior to Encounter Current Outpatient Medications on File Prior to Visit Medication Sig Dispense Refill  acetaminophen (TYLENOL) 650 MG ER tablet Take 650 mg by mouth every 8 (eight) hours as needed      brimonidine-timoloL (COMBIGAN) 0.2-0.5 % ophthalmic solution Apply 1 drop to eye every 12 (twelve) hours      cholecalciferol (VITAMIN D3) 1000 unit tablet Take 2,000 Units by mouth once daily      dicyclomine (BENTYL) 10 mg capsule Take 10 mg by mouth 4 (four) times daily before meals and nightly      estradioL (ESTRACE) 1 MG  tablet        gabapentin (NEURONTIN) 100 MG capsule Take 1 tablet in the morning and bedtime along with 300mg  tablets.      levothyroxine (SYNTHROID) 112 MCG tablet Take 112 mcg by mouth      olmesartan (BENICAR) 20 MG tablet Take 20 mg by mouth once daily      ondansetron (ZOFRAN) 4 MG tablet Take 4 mg by mouth every 8 (eight) hours as needed      pancrelipase (CREON) 36,000-114,000-180,000 unit DR capsule Take 72,000 Units by mouth      rosuvastatin (CRESTOR) 10 MG tablet Take 10 mg by mouth once daily      multivitamin tablet Take 1 tablet by mouth once daily        No current facility-administered medications on file prior to visit.      Family History Family History Problem Relation Age of Onset  High blood pressure (Hypertension) Mother    Colon  cancer Mother    Diabetes Father    Stroke Sister        Tobacco Use History Social History    Tobacco Use Smoking Status Never Smokeless Tobacco Never      Social History Social History    Socioeconomic History  Marital status: Widowed Tobacco Use  Smoking status: Never  Smokeless tobacco: Never Substance and Sexual Activity  Alcohol use: Never  Drug use: Never    Social Drivers of Health    Food Insecurity: No Food Insecurity (04/16/2022)   Received from [facility]   Hunger Vital Sign    Worried About Running Out of Food in the Last Year: Never true    Ran Out of Food in the Last Year: Never true Transportation Needs: No Transportation Needs (04/16/2022)   Received from [facility] - Transportation    Lack of Transportation (Medical): No    Lack of Transportation (Non-Medical): No   Received from [facility]   Social Network      Objective:     Vitals:   02/09/23 1023 BP: (!) 140/80 Pulse: 84 Temp: 36.4 C (97.6 F) SpO2: 98% Weight: 73.7 kg (162 lb 6.4 oz) Height: 160 cm (5\' 3" ) PainSc: 0-No pain PainLoc: Abdomen   Body mass index is 28.77 kg/m.   Physical Exam Constitutional:      Appearance: Normal appearance.  HENT:     Head: Normocephalic and atraumatic.     Mouth/Throat:     Mouth: Mucous membranes are moist.     Pharynx: Oropharynx is clear.  Eyes:     General: No scleral icterus.    Pupils: Pupils are equal, round, and reactive to light.  Cardiovascular:     Rate and Rhythm: Normal rate and regular rhythm.     Pulses: Normal pulses.     Heart sounds: No murmur heard.    No friction rub. No gallop.  Pulmonary:     Effort: Pulmonary effort is normal. No respiratory distress.     Breath sounds: Normal breath sounds. No stridor.  Abdominal:     General: Abdomen is flat.     Hernia: A hernia is present.     Musculoskeletal:        General: No swelling.  Skin:    General: Skin is warm.  Neurological:      General: No focal deficit present.     Mental Status: She is alert and oriented to person, place, and time. Mental status is at baseline.  Psychiatric:  Mood and Affect: Mood normal.        Thought Content: Thought content normal.        Judgment: Judgment normal.        Hernia Size: 4 cm Incarcerated:  Initial Hernia     Assessment and Plan: Diagnoses and all orders for this visit:   Incisional hernia without obstruction or gangrene     Tracy Hunt is a 78 y.o. female    1.  We will proceed to the OR for a robotic incisional hernia repair with mesh (TAPP). 2. All risks and benefits were discussed with the patient, to generally include infection, bleeding, damage to surrounding structures, acute and chronic nerve pain, and recurrence. Alternatives were offered and described.  All questions were answered and the patient voiced understanding of the procedure and wishes to proceed at this point.             No follow-ups on file.   Axel Filler, MD, [facility], [place] [facility]

## 2023-06-05 NOTE — H&P (View-Only) (Signed)
 Chief Complaint: New Consultation       History of Present Illness: Tracy Hunt is a 78 y.o. female who is seen today as an office consultation at the request of Dr. Nedra Hai for evaluation of New Consultation .   Patient is a 78 year old female who comes in secondary to an incisional hernia in the right upper quadrant.  Patient states that she had a lap chole done in the past.  States that she had a very large gallbladder at this time.  She states that she has not some pain discomfort right upper quadrant.  Patient was hospitalized the beginning of this year secondary to significant GI symptoms.  Patient underwent CT scan.  Patient found to have an incisional hernia in the right upper quadrant.  She states that she is having more pain discomfort has been ongoing.  States it is very sporadic.   She has noted some asymmetry of her abdominal wall.   CT scan I reviewed that showed a 3.6 cm fat-containing incisional hernia.  This appears to be at the extraction point of the right upper quadrant hernia.               Review of Systems: A complete review of systems was obtained from the patient.  I have reviewed this information and discussed as appropriate with the patient.  See HPI as well for other ROS.   Review of Systems  Constitutional:  Negative for fever.  HENT:  Negative for congestion.   Eyes:  Negative for blurred vision.  Respiratory:  Negative for cough, shortness of breath and wheezing.   Cardiovascular:  Negative for chest pain and palpitations.  Gastrointestinal:  Negative for heartburn.  Genitourinary:  Negative for dysuria.  Musculoskeletal:  Negative for myalgias.  Skin:  Negative for rash.  Neurological:  Negative for dizziness and headaches.  Psychiatric/Behavioral:  Negative for depression and suicidal ideas.   All other systems reviewed and are negative.       Medical History: Past Medical History Past Medical History: Diagnosis Date  Arthritis    GERD  (gastroesophageal reflux disease)    Glaucoma (increased eye pressure)    Hyperlipidemia    Hypertension    Thyroid disease         Problem List There is no problem list on file for this patient.     Past Surgical History Past Surgical History: Procedure Laterality Date  APPENDECTOMY      CATARACT EXTRACTION      CHOLECYSTECTOMY      COLPOCLEISIS      HYSTERECTOMY      JOINT REPLACEMENT      Shoulder surgery          Allergies Allergies Allergen Reactions  Shellfish Derived Diarrhea     Only oysters  Morphine Other (See Comments)     Makes me hyper      Medications Ordered Prior to Encounter Current Outpatient Medications on File Prior to Visit Medication Sig Dispense Refill  acetaminophen (TYLENOL) 650 MG ER tablet Take 650 mg by mouth every 8 (eight) hours as needed      brimonidine-timoloL (COMBIGAN) 0.2-0.5 % ophthalmic solution Apply 1 drop to eye every 12 (twelve) hours      cholecalciferol (VITAMIN D3) 1000 unit tablet Take 2,000 Units by mouth once daily      dicyclomine (BENTYL) 10 mg capsule Take 10 mg by mouth 4 (four) times daily before meals and nightly      estradioL (ESTRACE) 1 MG  tablet        gabapentin (NEURONTIN) 100 MG capsule Take 1 tablet in the morning and bedtime along with 300mg  tablets.      levothyroxine (SYNTHROID) 112 MCG tablet Take 112 mcg by mouth      olmesartan (BENICAR) 20 MG tablet Take 20 mg by mouth once daily      ondansetron (ZOFRAN) 4 MG tablet Take 4 mg by mouth every 8 (eight) hours as needed      pancrelipase (CREON) 36,000-114,000-180,000 unit DR capsule Take 72,000 Units by mouth      rosuvastatin (CRESTOR) 10 MG tablet Take 10 mg by mouth once daily      multivitamin tablet Take 1 tablet by mouth once daily        No current facility-administered medications on file prior to visit.      Family History Family History Problem Relation Age of Onset  High blood pressure (Hypertension) Mother    Colon  cancer Mother    Diabetes Father    Stroke Sister        Tobacco Use History Social History    Tobacco Use Smoking Status Never Smokeless Tobacco Never      Social History Social History    Socioeconomic History  Marital status: Widowed Tobacco Use  Smoking status: Never  Smokeless tobacco: Never Substance and Sexual Activity  Alcohol use: Never  Drug use: Never    Social Drivers of Health    Food Insecurity: No Food Insecurity (04/16/2022)   Received from [facility]   Hunger Vital Sign    Worried About Running Out of Food in the Last Year: Never true    Ran Out of Food in the Last Year: Never true Transportation Needs: No Transportation Needs (04/16/2022)   Received from [facility] - Transportation    Lack of Transportation (Medical): No    Lack of Transportation (Non-Medical): No   Received from [facility]   Social Network      Objective:     Vitals:   02/09/23 1023 BP: (!) 140/80 Pulse: 84 Temp: 36.4 C (97.6 F) SpO2: 98% Weight: 73.7 kg (162 lb 6.4 oz) Height: 160 cm (5\' 3" ) PainSc: 0-No pain PainLoc: Abdomen   Body mass index is 28.77 kg/m.   Physical Exam Constitutional:      Appearance: Normal appearance.  HENT:     Head: Normocephalic and atraumatic.     Mouth/Throat:     Mouth: Mucous membranes are moist.     Pharynx: Oropharynx is clear.  Eyes:     General: No scleral icterus.    Pupils: Pupils are equal, round, and reactive to light.  Cardiovascular:     Rate and Rhythm: Normal rate and regular rhythm.     Pulses: Normal pulses.     Heart sounds: No murmur heard.    No friction rub. No gallop.  Pulmonary:     Effort: Pulmonary effort is normal. No respiratory distress.     Breath sounds: Normal breath sounds. No stridor.  Abdominal:     General: Abdomen is flat.     Hernia: A hernia is present.     Musculoskeletal:        General: No swelling.  Skin:    General: Skin is warm.  Neurological:      General: No focal deficit present.     Mental Status: She is alert and oriented to person, place, and time. Mental status is at baseline.  Psychiatric:  Mood and Affect: Mood normal.        Thought Content: Thought content normal.        Judgment: Judgment normal.        Hernia Size: 4 cm Incarcerated:  Initial Hernia     Assessment and Plan: Diagnoses and all orders for this visit:   Incisional hernia without obstruction or gangrene     Tracy Hunt is a 78 y.o. female    1.  We will proceed to the OR for a robotic incisional hernia repair with mesh (TAPP). 2. All risks and benefits were discussed with the patient, to generally include infection, bleeding, damage to surrounding structures, acute and chronic nerve pain, and recurrence. Alternatives were offered and described.  All questions were answered and the patient voiced understanding of the procedure and wishes to proceed at this point.             No follow-ups on file.   Axel Filler, MD, [facility], [place] [facility]

## 2023-06-13 ENCOUNTER — Other Ambulatory Visit: Payer: Self-pay

## 2023-06-13 ENCOUNTER — Ambulatory Visit (HOSPITAL_BASED_OUTPATIENT_CLINIC_OR_DEPARTMENT_OTHER): Payer: Self-pay | Admitting: Certified Registered Nurse Anesthetist

## 2023-06-13 ENCOUNTER — Ambulatory Visit (HOSPITAL_COMMUNITY): Payer: Self-pay | Admitting: Certified Registered Nurse Anesthetist

## 2023-06-13 ENCOUNTER — Other Ambulatory Visit (HOSPITAL_COMMUNITY): Payer: Self-pay

## 2023-06-13 ENCOUNTER — Encounter (HOSPITAL_COMMUNITY)
Admission: RE | Disposition: A | Discharge status: Home / Self Care | Payer: Self-pay | Source: Home / Self Care | Attending: General Surgery

## 2023-06-13 ENCOUNTER — Ambulatory Visit (HOSPITAL_COMMUNITY)
Admission: RE | Admit: 2023-06-13 | Discharge: 2023-06-13 | Disposition: A | Discharge status: Home / Self Care | Payer: Medicare HMO | Attending: General Surgery | Admitting: General Surgery

## 2023-06-13 ENCOUNTER — Encounter (HOSPITAL_COMMUNITY): Payer: Self-pay | Admitting: General Surgery

## 2023-06-13 DIAGNOSIS — I10 Essential (primary) hypertension: Secondary | ICD-10-CM | POA: Diagnosis not present

## 2023-06-13 DIAGNOSIS — K432 Incisional hernia without obstruction or gangrene: Secondary | ICD-10-CM | POA: Diagnosis not present

## 2023-06-13 DIAGNOSIS — Z9049 Acquired absence of other specified parts of digestive tract: Secondary | ICD-10-CM | POA: Insufficient documentation

## 2023-06-13 DIAGNOSIS — E039 Hypothyroidism, unspecified: Secondary | ICD-10-CM | POA: Insufficient documentation

## 2023-06-13 DIAGNOSIS — K430 Incisional hernia with obstruction, without gangrene: Secondary | ICD-10-CM | POA: Insufficient documentation

## 2023-06-13 DIAGNOSIS — M1990 Unspecified osteoarthritis, unspecified site: Secondary | ICD-10-CM | POA: Diagnosis not present

## 2023-06-13 HISTORY — DX: Other specified diseases of pancreas: K86.89

## 2023-06-13 HISTORY — PX: XI ROBOTIC ASSISTED VENTRAL HERNIA: SHX6789

## 2023-06-13 SURGERY — REPAIR, HERNIA, VENTRAL, ROBOT-ASSISTED
Anesthesia: General | Site: Abdomen

## 2023-06-13 MED ORDER — GLYCOPYRROLATE PF 0.2 MG/ML IJ SOSY
PREFILLED_SYRINGE | INTRAMUSCULAR | Status: AC
  Filled 2023-06-13: qty 1

## 2023-06-13 MED ORDER — SUCCINYLCHOLINE CHLORIDE 200 MG/10ML IV SOSY
PREFILLED_SYRINGE | INTRAVENOUS | Status: AC
  Filled 2023-06-13: qty 10

## 2023-06-13 MED ORDER — ONDANSETRON HCL 4 MG/2ML IJ SOLN
INTRAMUSCULAR | Status: DC | PRN
  Administered 2023-06-13: 4 mg via INTRAVENOUS

## 2023-06-13 MED ORDER — FENTANYL CITRATE (PF) 100 MCG/2ML IJ SOLN
INTRAMUSCULAR | Status: AC
  Filled 2023-06-13: qty 2

## 2023-06-13 MED ORDER — HYDRALAZINE HCL 20 MG/ML IJ SOLN
INTRAMUSCULAR | Status: AC
  Filled 2023-06-13: qty 1

## 2023-06-13 MED ORDER — ACETAMINOPHEN 500 MG PO TABS
1000.0000 mg | ORAL_TABLET | ORAL | Status: AC
  Administered 2023-06-13: 1000 mg via ORAL
  Filled 2023-06-13: qty 2

## 2023-06-13 MED ORDER — ONDANSETRON HCL 4 MG/2ML IJ SOLN
INTRAMUSCULAR | Status: AC
  Filled 2023-06-13: qty 2

## 2023-06-13 MED ORDER — FENTANYL CITRATE (PF) 100 MCG/2ML IJ SOLN
25.0000 ug | INTRAMUSCULAR | Status: DC | PRN
  Administered 2023-06-13 (×2): 25 ug via INTRAVENOUS

## 2023-06-13 MED ORDER — SUGAMMADEX SODIUM 200 MG/2ML IV SOLN
INTRAVENOUS | Status: DC | PRN
  Administered 2023-06-13: 50 mg via INTRAVENOUS
  Administered 2023-06-13: 150 mg via INTRAVENOUS

## 2023-06-13 MED ORDER — OXYCODONE HCL 5 MG PO TABS: 5.0000 mg | ORAL_TABLET | Freq: Once | ORAL | Status: DC | PRN

## 2023-06-13 MED ORDER — LACTATED RINGERS IV SOLN: INTRAVENOUS | Status: DC

## 2023-06-13 MED ORDER — PROPOFOL 10 MG/ML IV BOLUS
INTRAVENOUS | Status: DC | PRN
Start: 2023-06-13 — End: 2023-06-13
  Administered 2023-06-13: 150 mg via INTRAVENOUS

## 2023-06-13 MED ORDER — ROCURONIUM BROMIDE 10 MG/ML (PF) SYRINGE
PREFILLED_SYRINGE | INTRAVENOUS | Status: DC | PRN
Start: 2023-06-13 — End: 2023-06-13
  Administered 2023-06-13: 50 mg via INTRAVENOUS

## 2023-06-13 MED ORDER — SODIUM CHLORIDE (PF) 0.9 % IJ SOLN
INTRAMUSCULAR | Status: DC | PRN
  Administered 2023-06-13: 40 mL via SURGICAL_CAVITY

## 2023-06-13 MED ORDER — SODIUM CHLORIDE (PF) 0.9 % IJ SOLN
INTRAMUSCULAR | Status: AC
  Filled 2023-06-13: qty 20

## 2023-06-13 MED ORDER — LIDOCAINE 2% (20 MG/ML) 5 ML SYRINGE
INTRAMUSCULAR | Status: DC | PRN
  Administered 2023-06-13: 60 mg via INTRAVENOUS

## 2023-06-13 MED ORDER — BUPIVACAINE HCL 0.25 % IJ SOLN
INTRAMUSCULAR | Status: DC | PRN
  Administered 2023-06-13: 14 mL

## 2023-06-13 MED ORDER — STERILE WATER FOR IRRIGATION IR SOLN
Status: DC | PRN
  Administered 2023-06-13: 1000 mL

## 2023-06-13 MED ORDER — 0.9 % SODIUM CHLORIDE (POUR BTL) OPTIME
TOPICAL | Status: DC | PRN
  Administered 2023-06-13: 1000 mL

## 2023-06-13 MED ORDER — CHLORHEXIDINE GLUCONATE 0.12 % MT SOLN
15.0000 mL | Freq: Once | OROMUCOSAL | Status: AC
  Administered 2023-06-13: 15 mL via OROMUCOSAL
  Filled 2023-06-13: qty 15

## 2023-06-13 MED ORDER — OXYCODONE HCL 5 MG/5ML PO SOLN: 5.0000 mg | Freq: Once | ORAL | Status: DC | PRN

## 2023-06-13 MED ORDER — CEFAZOLIN SODIUM-DEXTROSE 2-4 GM/100ML-% IV SOLN
2.0000 g | INTRAVENOUS | Status: AC
Start: 2023-06-13 — End: 2023-06-13
  Administered 2023-06-13: 2 g via INTRAVENOUS
  Filled 2023-06-13: qty 100

## 2023-06-13 MED ORDER — BUPIVACAINE HCL (PF) 0.25 % IJ SOLN
INTRAMUSCULAR | Status: AC
  Filled 2023-06-13: qty 30

## 2023-06-13 MED ORDER — FENTANYL CITRATE (PF) 250 MCG/5ML IJ SOLN
INTRAMUSCULAR | Status: DC | PRN
  Administered 2023-06-13: 25 ug via INTRAVENOUS
  Administered 2023-06-13: 50 ug via INTRAVENOUS

## 2023-06-13 MED ORDER — DEXAMETHASONE SODIUM PHOSPHATE 10 MG/ML IJ SOLN
INTRAMUSCULAR | Status: AC
  Filled 2023-06-13: qty 1

## 2023-06-13 MED ORDER — LACTATED RINGERS IV SOLN: INTRAVENOUS | Status: DC | PRN

## 2023-06-13 MED ORDER — CHLORHEXIDINE GLUCONATE CLOTH 2 % EX PADS: 6.0000 | MEDICATED_PAD | Freq: Once | CUTANEOUS | Status: DC

## 2023-06-13 MED ORDER — FENTANYL CITRATE (PF) 250 MCG/5ML IJ SOLN
INTRAMUSCULAR | Status: AC
  Filled 2023-06-13: qty 5

## 2023-06-13 MED ORDER — ONDANSETRON HCL 4 MG/2ML IJ SOLN: 4.0000 mg | Freq: Four times a day (QID) | INTRAMUSCULAR | Status: DC | PRN

## 2023-06-13 MED ORDER — ROCURONIUM BROMIDE 10 MG/ML (PF) SYRINGE
PREFILLED_SYRINGE | INTRAVENOUS | Status: AC
  Filled 2023-06-13: qty 10

## 2023-06-13 MED ORDER — DEXAMETHASONE SODIUM PHOSPHATE 10 MG/ML IJ SOLN
INTRAMUSCULAR | Status: DC | PRN
  Administered 2023-06-13: 5 mg via INTRAVENOUS

## 2023-06-13 MED ORDER — TRAMADOL HCL 50 MG PO TABS
50.0000 mg | ORAL_TABLET | Freq: Four times a day (QID) | ORAL | 0 refills | Status: DC | PRN
  Filled 2023-06-13: qty 20, 5d supply, fill #0

## 2023-06-13 MED ORDER — HYDRALAZINE HCL 20 MG/ML IJ SOLN
10.0000 mg | Freq: Once | INTRAMUSCULAR | Status: AC
  Administered 2023-06-13: 10 mg via INTRAVENOUS

## 2023-06-13 MED ORDER — LIDOCAINE 2% (20 MG/ML) 5 ML SYRINGE
INTRAMUSCULAR | Status: AC
  Filled 2023-06-13: qty 5

## 2023-06-13 MED ORDER — EPHEDRINE SULFATE-NACL 50-0.9 MG/10ML-% IV SOSY
PREFILLED_SYRINGE | INTRAVENOUS | Status: DC | PRN
  Administered 2023-06-13 (×2): 5 mg via INTRAVENOUS
  Administered 2023-06-13: 10 mg via INTRAVENOUS

## 2023-06-13 MED ORDER — ORAL CARE MOUTH RINSE: 15.0000 mL | Freq: Once | OROMUCOSAL | Status: AC

## 2023-06-13 MED ORDER — PROPOFOL 10 MG/ML IV BOLUS
INTRAVENOUS | Status: AC
  Filled 2023-06-13: qty 20

## 2023-06-13 MED ORDER — EPHEDRINE SULFATE (PRESSORS) 50 MG/ML IJ SOLN: INTRAMUSCULAR | Status: DC | PRN

## 2023-06-13 MED ORDER — BUPIVACAINE LIPOSOME 1.3 % IJ SUSP
INTRAMUSCULAR | Status: AC
Start: 2023-06-13 — End: ?
  Filled 2023-06-13: qty 20

## 2023-06-13 SURGICAL SUPPLY — 53 items
APPLICATOR VISTASEAL 35 (MISCELLANEOUS) IMPLANT
BAG COUNTER SPONGE SURGICOUNT (BAG) IMPLANT
BINDER ABDOMINAL 10 UNV 27-48 (MISCELLANEOUS) IMPLANT
BINDER ABDOMINAL 12 ML 46-62 (SOFTGOODS) IMPLANT
CHLORAPREP W/TINT 26 (MISCELLANEOUS) ×1 IMPLANT
COVER MAYO STAND STRL (DRAPES) ×1 IMPLANT
COVER SURGICAL LIGHT HANDLE (MISCELLANEOUS) ×1 IMPLANT
COVER TIP SHEARS 8 DVNC (MISCELLANEOUS) ×1 IMPLANT
DEFOGGER SCOPE WARMER CLEARIFY (MISCELLANEOUS) ×1 IMPLANT
DERMABOND ADVANCED .7 DNX12 (GAUZE/BANDAGES/DRESSINGS) ×1 IMPLANT
DEVICE SECURE STRAP 25 ABSORB (INSTRUMENTS) IMPLANT
DEVICE TROCAR PUNCTURE CLOSURE (ENDOMECHANICALS) ×1 IMPLANT
DRAPE ARM DVNC X/XI (DISPOSABLE) ×4 IMPLANT
DRAPE COLUMN DVNC XI (DISPOSABLE) ×1 IMPLANT
DRAPE CV SPLIT W-CLR ANES SCRN (DRAPES) ×1 IMPLANT
DRAPE SURG ORHT 6 SPLT 77X108 (DRAPES) ×1 IMPLANT
DRIVER NDL MEGA SUTCUT DVNCXI (INSTRUMENTS) ×1 IMPLANT
DRIVER NDLE MEGA SUTCUT DVNCXI (INSTRUMENTS) ×1 IMPLANT
FORCEPS BPLR FENES DVNC XI (FORCEP) ×1 IMPLANT
FORCEPS PROGRASP DVNC XI (FORCEP) ×1 IMPLANT
GLOVE BIO SURGEON STRL SZ7.5 (GLOVE) ×2 IMPLANT
GLOVE PROTEXIS LATEX SZ 7.5 (GLOVE) IMPLANT
GLOVE SURG LATEX 7.5 PF (GLOVE) ×2 IMPLANT
GOWN STRL REUS W/ TWL LRG LVL3 (GOWN DISPOSABLE) ×2 IMPLANT
GOWN STRL REUS W/ TWL XL LVL3 (GOWN DISPOSABLE) ×2 IMPLANT
GOWN STRL REUS W/TWL 2XL LVL3 (GOWN DISPOSABLE) ×1 IMPLANT
IRRIG SUCT STRYKERFLOW 2 WTIP (MISCELLANEOUS) IMPLANT
IRRIGATION SUCT STRKRFLW 2 WTP (MISCELLANEOUS) IMPLANT
KIT BASIN OR (CUSTOM PROCEDURE TRAY) ×1 IMPLANT
KIT TURNOVER KIT B (KITS) ×1 IMPLANT
MARKER SKIN DUAL TIP RULER LAB (MISCELLANEOUS) ×1 IMPLANT
MESH PROGRIP LAP SLF FIX 16X12 (Mesh General) IMPLANT
NDL HYPO 22X1.5 SAFETY MO (MISCELLANEOUS) ×1 IMPLANT
NEEDLE HYPO 22X1.5 SAFETY MO (MISCELLANEOUS) ×1 IMPLANT
PAD ARMBOARD POSITIONER FOAM (MISCELLANEOUS) ×2 IMPLANT
PENCIL SMOKE EVACUATOR (MISCELLANEOUS) IMPLANT
SCISSORS LAP 5X35 DISP (ENDOMECHANICALS) IMPLANT
SCISSORS MNPLR CVD DVNC XI (INSTRUMENTS) ×1 IMPLANT
SEAL UNIV 5-12 XI (MISCELLANEOUS) ×3 IMPLANT
SET TUBE SMOKE EVAC HIGH FLOW (TUBING) ×1 IMPLANT
SPIKE FLUID TRANSFER (MISCELLANEOUS) ×1 IMPLANT
STOPCOCK 4 WAY LG BORE MALE ST (IV SETS) ×1 IMPLANT
SUT DVC VLOC 180 2-0 12IN GS21 (SUTURE) ×1 IMPLANT
SUT MNCRL AB 4-0 PS2 18 (SUTURE) ×1 IMPLANT
SUT STRATA 2-0 15 CT-1.5 (SUTURE) ×1 IMPLANT
SUT STRATA PDS 2-0 23 CT-1 (SUTURE) IMPLANT
SUT STRATAFIX PDS+0 CT1 9 (SUTURE) IMPLANT
SUT STRATAFIX SPIRAL PDS+ 0 30 (SUTURE) IMPLANT
SUT VLOC 180 0 9IN GS21 (SUTURE) IMPLANT
SUTURE DVC VL 180 2-0 12INGS21 (SUTURE) IMPLANT
TOWEL GREEN STERILE FF (TOWEL DISPOSABLE) ×1 IMPLANT
TRAY LAPAROSCOPIC MC (CUSTOM PROCEDURE TRAY) ×1 IMPLANT
TROCAR XCEL NON-BLD 5MMX100MML (ENDOMECHANICALS) IMPLANT

## 2023-06-13 NOTE — Anesthesia Procedure Notes (Signed)
 Procedure Name: Intubation Date/Time: 06/13/2023 7:36 AM  Performed by: Waynard Edwards, CRNAPre-anesthesia Checklist: Patient identified, Emergency Drugs available, Suction available and Patient being monitored Patient Re-evaluated:Patient Re-evaluated prior to induction Oxygen Delivery Method: Circle System Utilized Preoxygenation: Pre-oxygenation with 100% oxygen Induction Type: IV induction Ventilation: Mask ventilation without difficulty Laryngoscope Size: Mac and 3 Grade View: Grade II Tube type: Oral Tube size: 7.0 mm Number of attempts: 1 Airway Equipment and Method: Stylet Placement Confirmation: ETT inserted through vocal cords under direct vision, positive ETCO2 and breath sounds checked- equal and bilateral Secured at: 21 cm Tube secured with: Tape Dental Injury: Teeth and Oropharynx as per pre-operative assessment

## 2023-06-13 NOTE — Discharge Instructions (Signed)

## 2023-06-13 NOTE — Transfer of Care (Signed)
 Immediate Anesthesia Transfer of Care Note  Patient: Tracy Hunt  Procedure(s) Performed: ROBOTIC INCISIONAL HERNIA REPAIR WITH MESH (TAPP) (Abdomen)  Patient Location: PACU  Anesthesia Type:General  Level of Consciousness: drowsy and patient cooperative  Airway & Oxygen Therapy: Patient Spontanous Breathing and Patient connected to face mask oxygen  Post-op Assessment: Report given to RN and Post -op Vital signs reviewed and stable  Post vital signs: Reviewed and stable  Last Vitals:  Vitals Value Taken Time  BP 186/89 06/13/23 0905  Temp 36.4 C 06/13/23 0905  Pulse 67 06/13/23 0908  Resp 19 06/13/23 0908  SpO2 97 % 06/13/23 0908  Vitals shown include unfiled device data.  Last Pain:  Vitals:   06/13/23 0558  PainSc: 0-No pain      Patients Stated Pain Goal: 0 (06/13/23 0558)  Complications: No notable events documented.

## 2023-06-13 NOTE — Op Note (Signed)
 06/13/2023  8:46 AM  PATIENT:  Tracy Hunt  78 y.o. female  PRE-OPERATIVE DIAGNOSIS:  INCISIONAL HERNIA 4cm  POST-OPERATIVE DIAGNOSIS:  INCISIONAL HERNIA 4cm  PROCEDURE:  Procedure(s): ROBOTIC INCISIONAL HERNIA REPAIR WITH MESH (TAPP)   SURGEON:  Surgeons and Role:    Axel Filler, MD - Primary  ASSISTANTS: Berenda Morale, RNFA   ANESTHESIA:   local and general  EBL:  minimal   BLOOD ADMINISTERED:none  DRAINS: none   LOCAL MEDICATIONS USED:  BUPIVICAINE & Exparel  SPECIMEN:  No Specimen  DISPOSITION OF SPECIMEN:  N/A  COUNTS:  YES  TOURNIQUET:  * No tourniquets in log *  DICTATION: .Dragon Dictation  Details of procedure: After the patient was consented he was taken back to the OR and placed in the supine position with bilateral SCDs in place. He underwent general endotracheal anesthesia. Patient was prepped and draped in standard fashion. Timeout was called all facts verified.  Veress needle technique was used to insufflate the abdomen at Palmer's point to 15mm of Hg. Subsequent to this, 8mm trocar was then placed under the left lowe quandrant. Subsequent to this a camera was placed intra-abdominally. There is no injury to any abdominal organs. At this time two 8 mm ports were placed in the right lower quandrant and infraumbilical midline under direct visualization. At this time the robot was docked.   At this time an approximate 4 cm incisional hernia in the epigastrium.  The peritoneum was taken down from the posterior rectus sheath. This was done across the midline and over to the right side of the abdomen. The hernia sac was encountered. This was reduced in its entirety. The peritoneal fat was incarcerated in the hernia. This was reduced entirely. I proceeded to continue the dissection of the peritoneum circumferentially around the hernia up towards the diaphragmatic fibers. Ultimately a pocket measuring approximately 15 x 10 cm in size was created in  the preperitoneal space.  At this time and 9 inch 0 V-lock suture was used in a standard running fashion to reapproximate the fascial defect.  At this time a piece of laparoscopic ProGrip was trimmed to size and shape.  This was placed into the abdominal cavity. This was placed in the preperitoneal pocket. This mesh lay flat. The peritoneum was then closed using a running 2-0 V-Loc.  The robot was undocked. A 0 Vicryl was used via Endo Close to reapproximate the fascia at the umbilical port site.  The insufflation was evacuated. All trochars were removed.  The trocar sites were then reapproximated using 4-0 Monocryl subcuticular fashion. The skin was dressed with Steri-Strips gauze and tape. The patient had a procedure well was taken to the recovery room stable condition.        PLAN OF CARE: Discharge to home after PACU  PATIENT DISPOSITION:  PACU - hemodynamically stable.   Delay start of Pharmacological VTE agent (>24hrs) due to surgical blood loss or risk of bleeding: not applicable

## 2023-06-13 NOTE — Anesthesia Preprocedure Evaluation (Signed)
 Anesthesia Evaluation  Patient identified by MRN, date of birth, ID band Patient awake    Reviewed: Allergy & Precautions, H&P , NPO status , Patient's Chart, lab work & pertinent test results  Airway Mallampati: II   Neck ROM: full    Dental   Pulmonary    breath sounds clear to auscultation       Cardiovascular hypertension,  Rhythm:regular Rate:Normal     Neuro/Psych    GI/Hepatic hiatal hernia, PUD,GERD  ,,  Endo/Other  Hypothyroidism    Renal/GU      Musculoskeletal  (+) Arthritis ,    Abdominal   Peds  Hematology   Anesthesia Other Findings   Reproductive/Obstetrics                             Anesthesia Physical Anesthesia Plan  ASA: 2  Anesthesia Plan: General   Post-op Pain Management:    Induction: Intravenous  PONV Risk Score and Plan: 3 and Ondansetron, Dexamethasone and Treatment may vary due to age or medical condition  Airway Management Planned: Oral ETT  Additional Equipment:   Intra-op Plan:   Post-operative Plan: Extubation in OR  Informed Consent: I have reviewed the patients History and Physical, chart, labs and discussed the procedure including the risks, benefits and alternatives for the proposed anesthesia with the patient or authorized representative who has indicated his/her understanding and acceptance.     Dental advisory given  Plan Discussed with: CRNA, Anesthesiologist and Surgeon  Anesthesia Plan Comments:        Anesthesia Quick Evaluation

## 2023-06-13 NOTE — Interval H&P Note (Signed)
 History and Physical Interval Note:  06/13/2023 7:21 AM  Tracy Hunt Del Portal  has presented today for surgery, with the diagnosis of INCISIONAL HERNIA.  The various methods of treatment have been discussed with the patient and family. After consideration of risks, benefits and other options for treatment, the patient has consented to  Procedure(s): ROBOTIC INCISIONAL HERNIA REPAIR WITH MESH (TAPP) (N/A) as a surgical intervention.  The patient's history has been reviewed, patient examined, no change in status, stable for surgery.  I have reviewed the patient's chart and labs.  Questions were answered to the patient's satisfaction.     Axel Filler

## 2023-06-14 ENCOUNTER — Encounter (HOSPITAL_COMMUNITY): Payer: Self-pay | Admitting: General Surgery

## 2023-06-14 NOTE — Anesthesia Postprocedure Evaluation (Signed)
 Anesthesia Post Note  Patient: Tracy Hunt  Procedure(s) Performed: ROBOTIC INCISIONAL HERNIA REPAIR WITH MESH (TAPP) (Abdomen)     Patient location during evaluation: PACU Anesthesia Type: General Level of consciousness: awake and alert Pain management: pain level controlled Vital Signs Assessment: post-procedure vital signs reviewed and stable Respiratory status: spontaneous breathing, nonlabored ventilation, respiratory function stable and patient connected to nasal cannula oxygen Cardiovascular status: blood pressure returned to baseline and stable Postop Assessment: no apparent nausea or vomiting Anesthetic complications: no   No notable events documented.  Last Vitals:  Vitals:   06/13/23 0946 06/13/23 1002  BP: (!) 200/93 (!) 172/84  Pulse:  68  Resp:  16  Temp:  36.4 C  SpO2:  97%    Last Pain:  Vitals:   06/13/23 0558  PainSc: 0-No pain                 Malan Werk S

## 2023-06-27 DIAGNOSIS — H401131 Primary open-angle glaucoma, bilateral, mild stage: Secondary | ICD-10-CM | POA: Diagnosis not present

## 2023-06-27 DIAGNOSIS — Z961 Presence of intraocular lens: Secondary | ICD-10-CM | POA: Diagnosis not present

## 2023-07-03 DIAGNOSIS — Z9889 Other specified postprocedural states: Secondary | ICD-10-CM | POA: Diagnosis not present

## 2023-07-03 DIAGNOSIS — K432 Incisional hernia without obstruction or gangrene: Secondary | ICD-10-CM | POA: Diagnosis not present

## 2023-07-04 DIAGNOSIS — E1140 Type 2 diabetes mellitus with diabetic neuropathy, unspecified: Secondary | ICD-10-CM | POA: Diagnosis not present

## 2023-07-04 DIAGNOSIS — E785 Hyperlipidemia, unspecified: Secondary | ICD-10-CM | POA: Diagnosis not present

## 2023-07-04 DIAGNOSIS — M3505 Sjogren syndrome with inflammatory arthritis: Secondary | ICD-10-CM | POA: Diagnosis not present

## 2023-07-04 DIAGNOSIS — A0472 Enterocolitis due to Clostridium difficile, not specified as recurrent: Secondary | ICD-10-CM | POA: Diagnosis not present

## 2023-07-04 DIAGNOSIS — G609 Hereditary and idiopathic neuropathy, unspecified: Secondary | ICD-10-CM | POA: Diagnosis not present

## 2023-07-04 DIAGNOSIS — E039 Hypothyroidism, unspecified: Secondary | ICD-10-CM | POA: Diagnosis not present

## 2023-07-04 DIAGNOSIS — I10 Essential (primary) hypertension: Secondary | ICD-10-CM | POA: Diagnosis not present

## 2023-07-04 DIAGNOSIS — K8689 Other specified diseases of pancreas: Secondary | ICD-10-CM | POA: Diagnosis not present

## 2023-07-10 ENCOUNTER — Other Ambulatory Visit: Payer: Self-pay

## 2023-07-10 ENCOUNTER — Encounter: Payer: Self-pay | Admitting: Physical Therapy

## 2023-07-10 ENCOUNTER — Ambulatory Visit: Attending: Internal Medicine | Admitting: Physical Therapy

## 2023-07-10 DIAGNOSIS — Z6828 Body mass index (BMI) 28.0-28.9, adult: Secondary | ICD-10-CM | POA: Diagnosis not present

## 2023-07-10 DIAGNOSIS — G5793 Unspecified mononeuropathy of bilateral lower limbs: Secondary | ICD-10-CM | POA: Diagnosis not present

## 2023-07-10 DIAGNOSIS — E663 Overweight: Secondary | ICD-10-CM | POA: Diagnosis not present

## 2023-07-10 DIAGNOSIS — Z8719 Personal history of other diseases of the digestive system: Secondary | ICD-10-CM | POA: Diagnosis not present

## 2023-07-10 DIAGNOSIS — R2689 Other abnormalities of gait and mobility: Secondary | ICD-10-CM | POA: Diagnosis not present

## 2023-07-10 DIAGNOSIS — Z9889 Other specified postprocedural states: Secondary | ICD-10-CM | POA: Diagnosis not present

## 2023-07-10 DIAGNOSIS — R279 Unspecified lack of coordination: Secondary | ICD-10-CM | POA: Insufficient documentation

## 2023-07-10 DIAGNOSIS — M1A09X0 Idiopathic chronic gout, multiple sites, without tophus (tophi): Secondary | ICD-10-CM | POA: Diagnosis not present

## 2023-07-10 DIAGNOSIS — R293 Abnormal posture: Secondary | ICD-10-CM | POA: Diagnosis not present

## 2023-07-10 DIAGNOSIS — R251 Tremor, unspecified: Secondary | ICD-10-CM | POA: Diagnosis not present

## 2023-07-10 DIAGNOSIS — M6281 Muscle weakness (generalized): Secondary | ICD-10-CM | POA: Insufficient documentation

## 2023-07-10 DIAGNOSIS — M79645 Pain in left finger(s): Secondary | ICD-10-CM | POA: Diagnosis not present

## 2023-07-10 DIAGNOSIS — M359 Systemic involvement of connective tissue, unspecified: Secondary | ICD-10-CM | POA: Diagnosis not present

## 2023-07-10 DIAGNOSIS — M1991 Primary osteoarthritis, unspecified site: Secondary | ICD-10-CM | POA: Diagnosis not present

## 2023-07-10 NOTE — Therapy (Signed)
 OUTPATIENT PHYSICAL THERAPY FEMALE PELVIC EVALUATION   Patient Name: Tracy Hunt Portal MRN: [id number] DOB:10-Jun-1945, 78 y.o., female Today's Date: 07/10/2023  END OF SESSION:  PT End of Session - 07/10/23 1357     Visit Number 1    Authorization Type waiting on auth- [employer]    PT Start Time 1230    PT Stop Time 1330    PT Time Calculation (min) 60 min    Activity Tolerance Patient tolerated treatment well    Behavior During Therapy [facility] for tasks assessed/performed             Past Medical History:  Diagnosis Date   Acid reflux    Anemia    Arthritis    Bell's palsy 1988   C. difficile diarrhea 03/2022   Complication of anesthesia    Hard to wake up after colonoscopy around 2000, but no issues with surgeries/colonoscopies after that   Diverticulitis    Dry eye    Family history of adverse reaction to anesthesia    mom hard to wake up and had lethargy after anesthesia   Glaucoma    Gout    Hepatitis 1986   secondary to varicella was in hospital for 2 days   Hiatal hernia    Hyperlipemia    Hypertension    Hypothyroidism    Malaria    as as child   Mumps    Pancreatic insufficiency    Per patient and her daughter   Pre-diabetes    Rosacea    Scoliosis    Sjogren's syndrome [facility])    Past Surgical History:  Procedure Laterality Date   ABDOMINAL HYSTERECTOMY  1995   APPENDECTOMY  1995   BIOPSY  04/08/2023   Procedure: BIOPSY;  Surgeon: Daina Drum, MD;  Location: [facility] ENDOSCOPY;  Service: Gastroenterology;;   CATARACT EXTRACTION Bilateral    CHOLECYSTECTOMY  01/16/2012   COLPOCLEISIS N/A 12/05/2022   Procedure: COLPOCLEISIS;  Surgeon: Arma Lamp, MD;  Location: [facility];  Service: Gynecology;  Laterality: N/A;  Total time requested is 2 hours   CYSTOSCOPY N/A 12/05/2022   Procedure: CYSTOSCOPY;  Surgeon: Arma Lamp, MD;  Location: [facility];  Service: Gynecology;  Laterality: N/A;    FLEXIBLE SIGMOIDOSCOPY N/A 04/08/2023   Procedure: FLEXIBLE SIGMOIDOSCOPY;  Surgeon: Daina Drum, MD;  Location: [facility] ENDOSCOPY;  Service: Gastroenterology;  Laterality: N/A;   KNEE ARTHROPLASTY Left 12/27/2010   KNEE ARTHROPLASTY  08/10/2015   Lumbar Radio Frequency Ablation  11/27/2012   L3, L4, L5   Lumbar Radio Requency Ablation  02/05/2014   L3, L4, L5   RECTOCELE REPAIR N/A 12/05/2022   Procedure: Levator plication with perineorrhaphy;  Surgeon: Arma Lamp, MD;  Location: [facility];  Service: Gynecology;  Laterality: N/A;   REVERSE SHOULDER ARTHROPLASTY Left 10/23/2020   Procedure: REVERSE SHOULDER ARTHROPLASTY;  Surgeon: Winston Hawking, MD;  Location: [facility] ORS;  Service: Orthopedics;  Laterality: Left;  with ISB   steroid injections     L3,L4, L5   XI ROBOTIC ASSISTED VENTRAL HERNIA N/A 06/13/2023   Procedure: ROBOTIC INCISIONAL HERNIA REPAIR WITH MESH (TAPP);  Surgeon: Shela Derby, MD;  Location: [facility] OR;  Service: General;  Laterality: N/A;   Patient Active Problem List   Diagnosis Date Noted   Syncope 04/06/2023   Colitis 04/05/2023   Hyperlipidemia 12/23/2022   Enteritis due to Clostridium difficile 04/22/2022   C. 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   Acute hepatitis 04/24/2020   Arthritis 04/24/2020   Hypertensive disorder 04/24/2020   Hypothyroidism 04/24/2020   Peptic ulcer 04/24/2020   Lumbar radiculopathy 10/04/2017    PCP: Lonzie Robins, MD PCP - General  REFERRING PROVIDER: Daina Drum, MD  REFERRING DIAG: K86.89 (ICD-10-CM) - Pancreatic  insufficiency K59.00 (ICD-10-CM) - Constipation, unspecified constipation type K62.6 (ICD-10-CM) - Solitary rectal ulcer Z87.19 (ICD-10-CM) - History of colitis  THERAPY DIAG:  Muscle weakness (generalized)  Abnormal posture  Unspecified lack of coordination  Other abnormalities of gait and mobility  Rationale for Evaluation and Treatment: Rehabilitation  ONSET DATE: 3-4 years ago 2021  SUBJECTIVE:                                                                                                                                                                                           SUBJECTIVE STATEMENT: Pt reports that she is feeling better as far as constipation If she skips a day, she will have a bowel movement next day She wants to address urinary urgency, has to get up at night and sometimes does not make it. Every once in a while.  Sometimes she waits too long because she is sitting somewhere in the house and does not make it.  Pushes herself too long.  Dr Ellwood Haber did a bladder surgery last year. Bladder was prolapsing.  Pt reports that she closed her up, cannot have sex, pt reports that she feels perfectly fine, she does not have the discomfort, she does not have the rectocele anymore.  Pt reports that she has had a lot of PT in her life- neck and back but is not sure what pelvic PT is.   Fluid intake: cup of tea, cup of apple juice and 3 glasses of water , 3 glasses at lunch, 3 glasses at dinner, another cup of apple juice- takes with her meds for pancreas- pancreas does not make enzymes- they told her to hydrate  Had a hernia surgery 3/25, not supposed to lift more then 20lbs  Goes to fitness  with a trainer, works on [employer] drinking water  around 9:30, goes to be around 11,      PAIN: no pain  PRECAUTIONS: None  RED FLAGS: None   WEIGHT BEARING RESTRICTIONS: Yes 20 lbs  FALLS:  Has patient fallen in last 6 months? No  OCCUPATION: volunteers  ACTIVITY  LEVEL : pretty active, does activities at [employer], takes art classes and piano lessons, volunteers  PLOF: Independent  PATIENT GOALS: to make it to the bathroom   PERTINENT HISTORY:  ABDOMINAL HYSTERECTOMY  1995    APPENDECTOMY  1995    BIOPSY  04/08/2023 Procedure: BIOPSY;  Surgeon: Daina Drum, MD;  Location: [facility] ENDOSCOPY;  Service: Gastroenterology;;   CATARACT EXTRACTION Bilateral     CHOLECYSTECTOMY  01/16/2012    COLPOCLEISIS       Sexual abuse: No  BOWEL MOVEMENT: sometimes constipation, resolves quickly Fiber supplement/laxative Yes Miralax  every day  URINATION: Pain with urination: No Fully empty bladder: No, has to sit there for a little bit, eventually everything comes out Stream: Strong usually, sometimes weak Urgency: No Frequency: no Leakage: Urge to void and Walking to the bathroom Pads: Yes: especially when she travels   INTERCOURSE: not active  Laxative:  PREGNANCY: Vaginal deliveries 2 Tearing Yes:     PROLAPSE: None   OBJECTIVE:  Note: Objective measures were completed at Evaluation unless otherwise noted.  PATIENT SURVEYS:    UIQ-7 - 5   COGNITION: Overall cognitive status: Within functional limits for tasks assessed     SENSATION: Light touch: Appears intact  LUMBAR SPECIAL TESTS:  Single leg stance test: Positive for weakness bilateral hips and knees  GAIT: Assistive device utilized: None Comments: decreased balance, slow gait  POSTURE: rounded shoulders, forward head, and flexed trunk    LUMBARAROM/PROM:  A/PROM A/PROM  Eval-% available  Flexion 50  Extension 50  Right lateral flexion 75  Left lateral flexion 75  Right rotation 75  Left rotation 75   (Blank rows = not tested)  LOWER EXTREMITY [id number] functional limitations   LOWER EXTREMITY MMT: bilateral hip weakness present  left 4/5. Right 4-/5 PALPATION:   General: able to engage lower abdominals with transverse abdominis breath and dem diaphragmatic  breathing  Pelvic Alignment: uneven  Abdominal: tight and tender abdominal scar ( week 4 post surgery- hernia repair)                External Perineal Exam: deferred to a later late d/t time                             Internal Pelvic Floor: deferred to a later late d/t time  Patient confirms identification and approves PT to assess internal pelvic floor and treatment No  PELVIC MMT:   MMT eval  Vaginal   Internal Anal Sphincter   External Anal Sphincter   Puborectalis   Diastasis Recti no  (Blank rows = not tested)        TONE: Low abdominal tone  PROLAPSE: deferred to a later late d/t time  TODAY'S TREATMENT:  DATE: 07/10/23   EVAL see below   PATIENT EDUCATION/ there acts:  Education details: fiber, relevant anatomy, HEP, exam findings, expectations of PT Person educated: Patient Education method: Explanation, Demonstration, Tactile cues, Verbal cues, and Handouts Education comprehension: verbalized understanding, returned demonstration, verbal cues required, tactile cues required, and needs further education  HOME EXERCISE PROGRAM: Access Code: [email or web address] URL: [email or web address] Date: 07/10/2023 Prepared by: Jameson Mcburney Kanyla Omeara  Exercises - Horizontal abd with TB with TRA breath  - 1 x daily - 7 x weekly - 3 sets - 10 reps - Sit to Stand with Pelvic Floor Contraction  - 1 x daily - 7 x weekly - 3 sets - 10 reps  Patient Education - Bowel Emptying Techniques - Lifestyle Changes to Support Urinary and Bladder Health - Urinary Urge Control Techniques  ASSESSMENT:  CLINICAL IMPRESSION: Patient is a 78 y.o. F who was seen today for physical therapy evaluation and treatment for urinary incontinence.and constipation. She was originally referred for constipation, however it is better and she mentioned urinary incontinence when she  cannot make it to the bathroom when she wakes up at night. She presents with decreased awareness of her pelvic floor, bilateral lower extremity weakness, difficulty with ambulation and poor balance, has a recent history of several abdominal and pelvic surgeries and will benefit from PT to improve strength and reduce urinary incontinence. She reported that she has to hydrate a lot d/t pancreatic meds and sometimes sits too long and waits too long before going to the bathroom. Wears pads for travel. She will benefit from PT  OBJECTIVE IMPAIRMENTS: Abnormal gait, cardiopulmonary status limiting activity, decreased activity tolerance, decreased balance, decreased coordination, decreased endurance, decreased mobility, difficulty walking, decreased ROM, decreased strength, and impaired tone.   ACTIVITY LIMITATIONS: continence and toileting  PARTICIPATION LIMITATIONS: community activity  PERSONAL FACTORS: Age and Time since onset of injury/illness/exacerbation are also affecting patient's functional outcome.   REHAB POTENTIAL: Good  CLINICAL DECISION MAKING: Evolving/moderate complexity  EVALUATION COMPLEXITY: Moderate   GOALS: Goals reviewed with patient? Yes  SHORT TERM GOALS: Target date: 08/07/2023    Pt will be independent with HEP.   Baseline: Goal status: INITIAL  2.  Pt will be independent with the knack, urge suppression technique, and double voiding in order to improve bladder habits and decrease urinary incontinence.   Baseline:  Goal status: INITIAL  3.  Pt will complete her bladder diary to identify bladder irritants Baseline:  Goal status: INITIAL  4.  Pt will tolerate 30 mins of exercise session without increased pain and fatigue               Baseline:  Goal status: INITIAL  5.  Pt will be independent with use of squatty potty, relaxed toileting mechanics, and improved bowel movement techniques in order to increase ease of bowel movements and complete evacuation.    Baseline:  Goal status: INITIAL    LONG TERM GOALS: Target date: 10/02/2023    Pt will be independent with advanced HEP.   Baseline:  Goal status: INITIAL  2.  Pt will stay dry when she gets up at night in order to be able to get better rest Baseline:  Goal status: INITIAL  3.  Pt will dem 5/5 MMT bilateral hips in order to decrease risk of falls Baseline:  Goal status: INITIAL  4.  Pt will be able to make it to the bathroom 100% of time without leaking in order to be able to stay active in her community  Baseline:  Goal status: INITIAL  5.  Pt will report at least 5 Bms / week to have a healthy bowel routine and reduced risk of complications  Baseline:  Goal status: INITIAL   PLAN:  PT FREQUENCY: 1-2x/week  PT DURATION: 12 weeks  PLANNED INTERVENTIONS: 97110-Therapeutic exercises, 97530- Therapeutic activity, 97112- Neuromuscular re-education, 97535- Self Care, 84696- Manual therapy, [phone]- Electrical stimulation (manual), Patient/Family education, Balance training, Taping, Dry Needling, Joint mobilization, Joint manipulation, Spinal manipulation, Spinal mobilization, Scar mobilization, Vestibular training, Cryotherapy, Moist heat, and Biofeedback  PLAN FOR NEXT SESSION: complete eval , internal and exercises   Evola Hollis, PT 07/10/2023, 2:01 PM

## 2023-07-13 ENCOUNTER — Ambulatory Visit: Payer: Medicare HMO | Admitting: Physical Therapy

## 2023-08-24 ENCOUNTER — Ambulatory Visit: Attending: Internal Medicine | Admitting: Physical Therapy

## 2023-08-24 ENCOUNTER — Encounter: Payer: Self-pay | Admitting: Physical Therapy

## 2023-08-24 DIAGNOSIS — M6281 Muscle weakness (generalized): Secondary | ICD-10-CM | POA: Diagnosis present

## 2023-08-24 DIAGNOSIS — R293 Abnormal posture: Secondary | ICD-10-CM | POA: Insufficient documentation

## 2023-08-24 DIAGNOSIS — R279 Unspecified lack of coordination: Secondary | ICD-10-CM | POA: Diagnosis present

## 2023-08-24 DIAGNOSIS — R2689 Other abnormalities of gait and mobility: Secondary | ICD-10-CM | POA: Insufficient documentation

## 2023-08-24 NOTE — Therapy (Addendum)
 OUTPATIENT PHYSICAL THERAPY FEMALE PELVIC TREATMENT   Patient Name: Tracy Hunt MRN: [id number] DOB:06-09-1945, 78 y.o., female Today's Date: 08/24/2023  END OF SESSION:  PT End of Session - 08/24/23 1505     Visit Number 2    Authorization Type waiting on auth- [employer]    Authorization Time Period 10 visits    PT Start Time 1445    PT Stop Time 1530    PT Time Calculation (min) 45 min    Activity Tolerance Patient tolerated treatment well    Behavior During Therapy [facility] for tasks assessed/performed              Past Medical History:  Diagnosis Date   Acid reflux    Anemia    Arthritis    Bell's palsy 1988   C. difficile diarrhea 03/2022   Complication of anesthesia    Hard to wake up after colonoscopy around 2000, but no issues with surgeries/colonoscopies after that   Diverticulitis    Dry eye    Family history of adverse reaction to anesthesia    mom hard to wake up and had lethargy after anesthesia   Glaucoma    Gout    Hepatitis 1986   secondary to varicella was in hospital for 2 days   Hiatal hernia    Hyperlipemia    Hypertension    Hypothyroidism    Malaria    as as child   Mumps    Pancreatic insufficiency    Per patient and her daughter   Pre-diabetes    Rosacea    Scoliosis    Sjogren's syndrome [facility])    Past Surgical History:  Procedure Laterality Date   ABDOMINAL HYSTERECTOMY  1995   APPENDECTOMY  1995   BIOPSY  04/08/2023   Procedure: BIOPSY;  Surgeon: Daina Drum, MD;  Location: [facility] ENDOSCOPY;  Service: Gastroenterology;;   CATARACT EXTRACTION Bilateral    CHOLECYSTECTOMY  01/16/2012   COLPOCLEISIS N/A 12/05/2022   Procedure: COLPOCLEISIS;  Surgeon: Arma Lamp, MD;  Location: [facility];  Service: Gynecology;  Laterality: N/A;  Total time requested is 2 hours   CYSTOSCOPY N/A 12/05/2022   Procedure: CYSTOSCOPY;  Surgeon: Arma Lamp, MD;  Location: [facility];   Service: Gynecology;  Laterality: N/A;   FLEXIBLE SIGMOIDOSCOPY N/A 04/08/2023   Procedure: FLEXIBLE SIGMOIDOSCOPY;  Surgeon: Daina Drum, MD;  Location: [facility] ENDOSCOPY;  Service: Gastroenterology;  Laterality: N/A;   KNEE ARTHROPLASTY Left 12/27/2010   KNEE ARTHROPLASTY  08/10/2015   Lumbar Radio Frequency Ablation  11/27/2012   L3, L4, L5   Lumbar Radio Requency Ablation  02/05/2014   L3, L4, L5   RECTOCELE REPAIR N/A 12/05/2022   Procedure: Levator plication with perineorrhaphy;  Surgeon: Arma Lamp, MD;  Location: [facility];  Service: Gynecology;  Laterality: N/A;   REVERSE SHOULDER ARTHROPLASTY Left 10/23/2020   Procedure: REVERSE SHOULDER ARTHROPLASTY;  Surgeon: Winston Hawking, MD;  Location: [facility] ORS;  Service: Orthopedics;  Laterality: Left;  with ISB   steroid injections     L3,L4, L5   XI ROBOTIC ASSISTED VENTRAL HERNIA N/A 06/13/2023   Procedure: ROBOTIC INCISIONAL HERNIA REPAIR WITH MESH (TAPP);  Surgeon: Shela Derby, MD;  Location: [facility] OR;  Service: General;  Laterality: N/A;   Patient Active Problem List   Diagnosis Date Noted   Syncope 04/06/2023   Colitis 04/05/2023   Hyperlipidemia 12/23/2022   Enteritis due to Clostridium difficile 04/22/2022  C. difficile diarrhea 04/19/2022   Abnormal CT of the abdomen 04/19/2022   Diverticulitis 04/05/2022   Acute diverticulitis 04/04/2022   Abdominal pain 04/04/2022   HTN (hypertension) 04/04/2022   Hypokalemia 04/04/2022   Hypocalcemia 04/04/2022   Right foot pain 04/04/2022   Glaucoma 04/04/2022   Abnormality of gait due to impairment of balance 06/22/2021   H/O total shoulder replacement, left 10/23/2020   Acute hepatitis 04/24/2020   Arthritis 04/24/2020   Hypertensive disorder 04/24/2020   Hypothyroidism 04/24/2020   Peptic ulcer 04/24/2020   Lumbar radiculopathy 10/04/2017    PCP: Lonzie Robins, MD PCP - General  REFERRING PROVIDER: Daina Drum, MD  REFERRING DIAG: K86.89  (ICD-10-CM) - Pancreatic insufficiency K59.00 (ICD-10-CM) - Constipation, unspecified constipation type K62.6 (ICD-10-CM) - Solitary rectal ulcer Z87.19 (ICD-10-CM) - History of colitis  THERAPY DIAG:  Muscle weakness (generalized)  Abnormal posture  Unspecified lack of coordination  Other abnormalities of gait and mobility  Rationale for Evaluation and Treatment: Rehabilitation  ONSET DATE: 3-4 years ago 2021  SUBJECTIVE:                                                                                                                                                                                           SUBJECTIVE STATEMENT: Pt returning to PT after about 6 weeks Pt reports that she has had a few accidents, she sometimes does not make it to the bathroom.  Constipation is better, she is pretty regular.  Pt reports that when she wakes up at night, she has to take a minute to regain her balance. Because of her back and her feet. Sometimes she does not feel like getting up and she waits too long.  Sometimes when she is in the house  she does not make it.  She has neuropathy as well, working on her balance with a Systems analyst.        PAIN: no pain  PRECAUTIONS: None  RED FLAGS: None   WEIGHT BEARING RESTRICTIONS: Yes 20 lbs  FALLS:  Has patient fallen in last 6 months? No  OCCUPATION: volunteers  ACTIVITY LEVEL : pretty active, does activities at [employer], takes art classes and piano lessons, volunteers  PLOF: Independent  PATIENT GOALS: to make it to the bathroom   PERTINENT HISTORY:  ABDOMINAL HYSTERECTOMY  1995    APPENDECTOMY  1995    BIOPSY  04/08/2023 Procedure: BIOPSY;  Surgeon: Daina Drum, MD;  Location: [facility] ENDOSCOPY;  Service: Gastroenterology;;   CATARACT EXTRACTION Bilateral     CHOLECYSTECTOMY  01/16/2012    COLPOCLEISIS       Sexual abuse: No  BOWEL MOVEMENT: sometimes constipation, resolves quickly Fiber supplement/laxative Yes  Miralax  every day  URINATION: Pain with urination: No Fully empty bladder: No, has to sit there for a little bit, eventually everything comes out Stream: Strong usually, sometimes weak Urgency: No Frequency: no Leakage: Urge to void and Walking to the bathroom Pads: Yes: especially when she travels   INTERCOURSE: not active  Laxative:  PREGNANCY: Vaginal deliveries 2 Tearing Yes:     PROLAPSE: None   OBJECTIVE:  Note: Objective measures were completed at Evaluation unless otherwise noted.  PATIENT SURVEYS:    UIQ-7 - 5   COGNITION: Overall cognitive status: Within functional limits for tasks assessed     SENSATION: Light touch: Appears intact  LUMBAR SPECIAL TESTS:  Single leg stance test: Positive for weakness bilateral hips and knees  GAIT: Assistive device utilized: None Comments: decreased balance, slow gait  POSTURE: rounded shoulders, forward head, and flexed trunk    LUMBARAROM/PROM:  A/PROM A/PROM  Eval-% available  Flexion 50  Extension 50  Right lateral flexion 75  Left lateral flexion 75  Right rotation 75  Left rotation 75   (Blank rows = not tested)  LOWER EXTREMITY ZOX:WRUEAV functional limitations   LOWER EXTREMITY MMT: bilateral hip weakness present  left 4/5. Right 4-/5 PALPATION:   General: able to engage lower abdominals with transverse abdominis breath and dem diaphragmatic breathing  Pelvic Alignment: uneven  Abdominal: tight and tender abdominal scar ( week 4 post surgery- hernia repair)                External Perineal Exam: dryness present- low estrogen                             Internal Pelvic Floor: colpocleisis present, able to assess on left side of vaginal canal   Patient confirms identification and approves PT to assess internal pelvic floor and treatment yes Patient confirms identification and approves PT to assess internal pelvic floor and treatment Yes No emotional/communication barriers or cognitive  limitation. Patient is motivated to learn. Patient understands and agrees with treatment goals and plan. PT explains patient will be examined in standing, sitting, and lying down to see how their muscles and joints work. When they are ready, they will be asked to remove their underwear so PT can examine their perineum. The patient is also given the option of providing their own chaperone as one is not provided in our facility. The patient also has the right and is explained the right to defer or refuse any part of the evaluation or treatment including the internal exam. With the patient's consent, PT will use one gloved finger to gently assess the muscles of the pelvic floor, seeing how well it contracts and relaxes and if there is muscle symmetry. After, the patient will get dressed and PT and patient will discuss exam findings and plan of care. PT and patient discuss plan of care, schedule, attendance policy and HEP activities.     PELVIC MMT:   MMT eval  Vaginal 0/5  Internal Anal Sphincter   External Anal Sphincter   Puborectalis   Diastasis Recti no  (Blank rows = not tested)        TONE: Low tone  PROLAPSE: None noticed in hooklying  TODAY'S TREATMENT:  DATE: 08/24/23   EVAL see below  Neuro reed- ball press with pelvic floor contraction supine/ seated  Seated horizontal abduction with red theraband with transverse abdominis breath   PATIENT EDUCATION/ there acts:  Education details: fiber, relevant anatomy, HEP, exam findings, expectations of PT Person educated: Patient Education method: Explanation, Demonstration, Tactile cues, Verbal cues, and Handouts Education comprehension: verbalized understanding, returned demonstration, verbal cues required, tactile cues required, and needs further education  HOME EXERCISE PROGRAM: Access Code: [email or web address] URL:  [email or web address] Date: 07/10/2023 Prepared by: Jameson Mcburney Carnel Stegman  Exercises - Horizontal abd with TB with TRA breath  - 1 x daily - 7 x weekly - 3 sets - 10 reps - Sit to Stand with Pelvic Floor Contraction  - 1 x daily - 7 x weekly - 3 sets - 10 reps  Patient Education - Bowel Emptying Techniques - Lifestyle Changes to Support Urinary and Bladder Health - Urinary Urge Control Techniques  ASSESSMENT:  CLINICAL IMPRESSION: Patient is a 78 y.o. F who was seen today for physical therapy  treatment for urinary incontinence.and constipation. She was originally referred for constipation, however it is better and she mentioned urinary incontinence when she cannot make it to the bathroom when she wakes up at night. She presents with decreased awareness of her pelvic floor, bilateral lower extremity weakness, difficulty with ambulation and poor balance, has a recent history of several abdominal and pelvic surgeries and will benefit from PT to improve strength and reduce urinary incontinence. Findings notable for pelvic floor and core weakness and decreased pelvic floor awareness. She reported that she has to hydrate a lot d/t pancreatic meds and sometimes sits too long and waits too long before going to the bathroom. Wears pads for travel.       OBJECTIVE IMPAIRMENTS: Abnormal gait, cardiopulmonary status limiting activity, decreased activity tolerance, decreased balance, decreased coordination, decreased endurance, decreased mobility, difficulty walking, decreased ROM, decreased strength, and impaired tone.   ACTIVITY LIMITATIONS: continence and toileting  PARTICIPATION LIMITATIONS: community activity  PERSONAL FACTORS: Age and Time since onset of injury/illness/exacerbation are also affecting patient's functional outcome.   REHAB POTENTIAL: Good  CLINICAL DECISION MAKING: Evolving/moderate complexity  EVALUATION COMPLEXITY: Moderate   GOALS: Goals reviewed with patient?  Yes  SHORT TERM GOALS: Target date: 08/07/2023    Pt will be independent with HEP.   Baseline: Goal status: INITIAL  2.  Pt will be independent with the knack, urge suppression technique, and double voiding in order to improve bladder habits and decrease urinary incontinence.   Baseline:  Goal status: INITIAL  3.  Pt will complete her bladder diary to identify bladder irritants Baseline:  Goal status: INITIAL  4.  Pt will tolerate 30 mins of exercise session without increased pain and fatigue               Baseline:  Goal status: INITIAL  5.  Pt will be independent with use of squatty potty, relaxed toileting mechanics, and improved bowel movement techniques in order to increase ease of bowel movements and complete evacuation.   Baseline:  Goal status: INITIAL    LONG TERM GOALS: Target date: 10/02/2023    Pt will be independent with advanced HEP.   Baseline:  Goal status: INITIAL  2.  Pt will stay dry when she gets up at night in order to be able to get better rest Baseline:  Goal status: INITIAL  3.  Pt will dem 5/5 MMT bilateral hips in order to decrease risk of falls  Baseline:  Goal status: INITIAL  4.  Pt will be able to make it to the bathroom 100% of time without leaking in order to be able to stay active in her community  Baseline:  Goal status: INITIAL  5.  Pt will report at least 5 Bms / week to have a healthy bowel routine and reduced risk of complications  Baseline:  Goal status: INITIAL   PLAN:  PT FREQUENCY: 1-2x/week  PT DURATION: 12 weeks  PLANNED INTERVENTIONS: 97110-Therapeutic exercises, 97530- Therapeutic activity, 97112- Neuromuscular re-education, 97535- Self Care, 16109- Manual therapy, [phone]- Electrical stimulation (manual), Patient/Family education, Balance training, Taping, Dry Needling, Joint mobilization, Joint manipulation, Spinal manipulation, Spinal mobilization, Scar mobilization, Vestibular training, Cryotherapy, Moist heat, and  Biofeedback  PLAN FOR NEXT SESSION: complete eval , internal and exercises   Kanasia Gayman, PT 08/24/2023, 3:56 PM

## 2023-08-31 ENCOUNTER — Encounter: Payer: Self-pay | Admitting: Physical Therapy

## 2023-08-31 ENCOUNTER — Ambulatory Visit: Admitting: Physical Therapy

## 2023-08-31 DIAGNOSIS — R279 Unspecified lack of coordination: Secondary | ICD-10-CM

## 2023-08-31 DIAGNOSIS — R293 Abnormal posture: Secondary | ICD-10-CM | POA: Diagnosis not present

## 2023-08-31 DIAGNOSIS — R2689 Other abnormalities of gait and mobility: Secondary | ICD-10-CM | POA: Diagnosis not present

## 2023-08-31 DIAGNOSIS — M6281 Muscle weakness (generalized): Secondary | ICD-10-CM | POA: Diagnosis not present

## 2023-08-31 NOTE — Therapy (Signed)
 OUTPATIENT PHYSICAL THERAPY FEMALE PELVIC TREATMENT   Patient Name: Tracy Hunt Portal MRN: [id number] DOB:1945/09/16, 78 y.o., female Today's Date: 08/31/2023  END OF SESSION:  PT End of Session - 08/31/23 1457     Visit Number 3    Authorization Type [employer] APPROVED 10 VISITS #[id number] 07/10/2023-01/09/2024    Authorization Time Period 07/10/2023-01/09/2024    Authorization - Visit Number 3    PT Start Time 1450    PT Stop Time 1530    PT Time Calculation (min) 40 min    Activity Tolerance Patient tolerated treatment well    Behavior During Therapy [facility] for tasks assessed/performed            Past Medical History:  Diagnosis Date   Acid reflux    Anemia    Arthritis    Bell's palsy 1988   C. difficile diarrhea 03/2022   Complication of anesthesia    Hard to wake up after colonoscopy around 2000, but no issues with surgeries/colonoscopies after that   Diverticulitis    Dry eye    Family history of adverse reaction to anesthesia    mom hard to wake up and had lethargy after anesthesia   Glaucoma    Gout    Hepatitis 1986   secondary to varicella was in hospital for 2 days   Hiatal hernia    Hyperlipemia    Hypertension    Hypothyroidism    Malaria    as as child   Mumps    Pancreatic insufficiency    Per patient and her daughter   Pre-diabetes    Rosacea    Scoliosis    Sjogren's syndrome [facility])    Past Surgical History:  Procedure Laterality Date   ABDOMINAL HYSTERECTOMY  1995   APPENDECTOMY  1995   BIOPSY  04/08/2023   Procedure: BIOPSY;  Surgeon: Daina Drum, MD;  Location: [facility] ENDOSCOPY;  Service: Gastroenterology;;   CATARACT EXTRACTION Bilateral    CHOLECYSTECTOMY  01/16/2012   COLPOCLEISIS N/A 12/05/2022   Procedure: COLPOCLEISIS;  Surgeon: Arma Lamp, MD;  Location: [facility];  Service: Gynecology;  Laterality: N/A;  Total time requested is 2 hours   CYSTOSCOPY N/A 12/05/2022   Procedure: CYSTOSCOPY;   Surgeon: Arma Lamp, MD;  Location: [facility];  Service: Gynecology;  Laterality: N/A;   FLEXIBLE SIGMOIDOSCOPY N/A 04/08/2023   Procedure: FLEXIBLE SIGMOIDOSCOPY;  Surgeon: Daina Drum, MD;  Location: [facility] ENDOSCOPY;  Service: Gastroenterology;  Laterality: N/A;   KNEE ARTHROPLASTY Left 12/27/2010   KNEE ARTHROPLASTY  08/10/2015   Lumbar Radio Frequency Ablation  11/27/2012   L3, L4, L5   Lumbar Radio Requency Ablation  02/05/2014   L3, L4, L5   RECTOCELE REPAIR N/A 12/05/2022   Procedure: Levator plication with perineorrhaphy;  Surgeon: Arma Lamp, MD;  Location: [facility];  Service: Gynecology;  Laterality: N/A;   REVERSE SHOULDER ARTHROPLASTY Left 10/23/2020   Procedure: REVERSE SHOULDER ARTHROPLASTY;  Surgeon: Winston Hawking, MD;  Location: [facility] ORS;  Service: Orthopedics;  Laterality: Left;  with ISB   steroid injections     L3,L4, L5   XI ROBOTIC ASSISTED VENTRAL HERNIA N/A 06/13/2023   Procedure: ROBOTIC INCISIONAL HERNIA REPAIR WITH MESH (TAPP);  Surgeon: Shela Derby, MD;  Location: [facility];  Service: General;  Laterality: N/A;   Patient Active Problem List   Diagnosis Date Noted   Syncope 04/06/2023   Colitis 04/05/2023   Hyperlipidemia 12/23/2022   Enteritis  due to Clostridium difficile 04/22/2022   C. difficile diarrhea 04/19/2022   Abnormal CT of the abdomen 04/19/2022   Diverticulitis 04/05/2022   Acute diverticulitis 04/04/2022   Abdominal pain 04/04/2022   HTN (hypertension) 04/04/2022   Hypokalemia 04/04/2022   Hypocalcemia 04/04/2022   Right foot pain 04/04/2022   Glaucoma 04/04/2022   Abnormality of gait due to impairment of balance 06/22/2021   H/O total shoulder replacement, left 10/23/2020   Acute hepatitis 04/24/2020   Arthritis 04/24/2020   Hypertensive disorder 04/24/2020   Hypothyroidism 04/24/2020   Peptic ulcer 04/24/2020   Lumbar radiculopathy 10/04/2017    PCP: Lonzie Robins, MD PCP -  General  REFERRING PROVIDER: Daina Drum, MD  REFERRING DIAG: K86.89 (ICD-10-CM) - Pancreatic insufficiency K59.00 (ICD-10-CM) - Constipation, unspecified constipation type K62.6 (ICD-10-CM) - Solitary rectal ulcer Z87.19 (ICD-10-CM) - History of colitis  THERAPY DIAG:  Muscle weakness (generalized)  Unspecified lack of coordination  Other abnormalities of gait and mobility  Abnormal posture  Rationale for Evaluation and Treatment: Rehabilitation  ONSET DATE: 3-4 years ago 2021  SUBJECTIVE:                                                                                                                                                                                           SUBJECTIVE STATEMENT: date of hernia repair 06/13/23 Pt reports that she has had a hectic week, feels wobbly when she walks.  She is working with a Psychologist, educational to address it, she is helping her.  Constipation is better. She is taking Miralax  in the mornings, taking prunes, pears, oatmeal with mango chunks, honey, cinnamon and bananas, turmeric and and ginger tea with black paper.     (Last session) Pt returning to PT after about 6 weeks Pt reports that she has had a few accidents, she sometimes does not make it to the bathroom.  Constipation is better, she is pretty regular.  Pt reports that when she wakes up at night, she has to take a minute to regain her balance. Because of her back and her feet. Sometimes she does not feel like getting up and she waits too long.  Sometimes when she is in the house  she does not make it.  She has neuropathy as well, working on her balance with a Systems analyst.        PAIN: no pain  PRECAUTIONS: None  RED FLAGS: None   WEIGHT BEARING RESTRICTIONS: Yes 20 lbs  FALLS:  Has patient fallen in last 6 months? No  OCCUPATION: volunteers  ACTIVITY LEVEL : pretty active, does activities at [employer], takes art classes and piano  lessons, volunteers  PLOF:  Independent  PATIENT GOALS: to make it to the bathroom   PERTINENT HISTORY:  ABDOMINAL HYSTERECTOMY  1995    APPENDECTOMY  1995    BIOPSY  04/08/2023 Procedure: BIOPSY;  Surgeon: Daina Drum, MD;  Location: [facility] ENDOSCOPY;  Service: Gastroenterology;;   CATARACT EXTRACTION Bilateral     CHOLECYSTECTOMY  01/16/2012    COLPOCLEISIS       Sexual abuse: No  BOWEL MOVEMENT: sometimes constipation, resolves quickly Fiber supplement/laxative Yes Miralax  every day  URINATION: Pain with urination: No Fully empty bladder: No, has to sit there for a little bit, eventually everything comes out Stream: Strong usually, sometimes weak Urgency: No Frequency: no Leakage: Urge to void and Walking to the bathroom Pads: Yes: especially when she travels   INTERCOURSE: not active  Laxative:  PREGNANCY: Vaginal deliveries 2 Tearing Yes:     PROLAPSE: None   OBJECTIVE:  Note: Objective measures were completed at Evaluation unless otherwise noted.  PATIENT SURVEYS:    UIQ-7 - 5   COGNITION: Overall cognitive status: Within functional limits for tasks assessed     SENSATION: Light touch: Appears intact  LUMBAR SPECIAL TESTS:  Single leg stance test: Positive for weakness bilateral hips and knees  GAIT: Assistive device utilized: None Comments: decreased balance, slow gait  POSTURE: rounded shoulders, forward head, and flexed trunk    LUMBARAROM/PROM:  A/PROM A/PROM  Eval-% available  Flexion 50  Extension 50  Right lateral flexion 75  Left lateral flexion 75  Right rotation 75  Left rotation 75   (Blank rows = not tested)  LOWER EXTREMITY WUJ:[id number] functional limitations   LOWER EXTREMITY MMT: bilateral hip weakness present  left 4/5. Right 4-/5 PALPATION:   General: able to engage lower abdominals with transverse abdominis breath and dem diaphragmatic breathing  Pelvic Alignment: uneven  Abdominal: tight and tender abdominal scar ( week 4 post surgery- hernia  repair)                External Perineal Exam: dryness present- low estrogen                             Internal Pelvic Floor: colpocleisis present, able to assess on left side of vaginal canal   Patient confirms identification and approves PT to assess internal pelvic floor and treatment yes Patient confirms identification and approves PT to assess internal pelvic floor and treatment Yes No emotional/communication barriers or cognitive limitation. Patient is motivated to learn. Patient understands and agrees with treatment goals and plan. PT explains patient will be examined in standing, sitting, and lying down to see how their muscles and joints work. When they are ready, they will be asked to remove their underwear so PT can examine their perineum. The patient is also given the option of providing their own chaperone as one is not provided in our facility. The patient also has the right and is explained the right to defer or refuse any part of the evaluation or treatment including the internal exam. With the patient's consent, PT will use one gloved finger to gently assess the muscles of the pelvic floor, seeing how well it contracts and relaxes and if there is muscle symmetry. After, the patient will get dressed and PT and patient will discuss exam findings and plan of care. PT and patient discuss plan of care, schedule, attendance policy and HEP activities.     PELVIC MMT:  MMT eval  Vaginal 0/5  Internal Anal Sphincter   External Anal Sphincter   Puborectalis   Diastasis Recti no  (Blank rows = not tested)        TONE: Low tone  PROLAPSE: None noticed in hooklying  TODAY'S TREATMENT:                                                                                                                              DATE: 08/31/2023 Neuro reed- seated horizontal abduction with red theraband with hip adduction with ball with transverse abdominis breath  Seated ball press with pelvic floor  lift off table      EVAL see below  Neuro reed- ball press with pelvic floor contraction supine/ seated  Seated horizontal abduction with red theraband with transverse abdominis breath   PATIENT EDUCATION/ there acts:  Education details: fiber, relevant anatomy, HEP, exam findings, expectations of PT Person educated: Patient Education method: Explanation, Demonstration, Tactile cues, Verbal cues, and Handouts Education comprehension: verbalized understanding, returned demonstration, verbal cues required, tactile cues required, and needs further education  HOME EXERCISE PROGRAM: Access Code: [email or web address] URL: [email or web address] Date: 07/10/2023 Prepared by: Jameson Mcburney Kingstyn Deruiter  Exercises - Horizontal abd with TB with TRA breath  - 1 x daily - 7 x weekly - 3 sets - 10 reps - Sit to Stand with Pelvic Floor Contraction  - 1 x daily - 7 x weekly - 3 sets - 10 reps  Patient Education - Bowel Emptying Techniques - Lifestyle Changes to Support Urinary and Bladder Health - Urinary Urge Control Techniques  ASSESSMENT:  CLINICAL IMPRESSION: Patient is a 78 y.o. F who was seen today for physical therapy  treatment for urinary incontinence.and constipation. She was originally referred for constipation, however it is better and she mentioned urinary incontinence when she cannot make it to the bathroom when she wakes up at night. She presents with decreased awareness of her pelvic floor, bilateral lower extremity weakness, difficulty with ambulation and poor balance, has a recent history of several abdominal and pelvic surgeries and will benefit from PT to improve strength and reduce urinary incontinence. Findings notable for pelvic floor and core weakness and decreased pelvic floor awareness.  She is overall still deconditioned from her surgeries and with decreased balance and with decreased endurance.  She did fairly well with her new exercises today, laparoscopic scars not tender. Some  difficulty with core coordination. Will continue to benefit from PT.       OBJECTIVE IMPAIRMENTS: Abnormal gait, cardiopulmonary status limiting activity, decreased activity tolerance, decreased balance, decreased coordination, decreased endurance, decreased mobility, difficulty walking, decreased ROM, decreased strength, and impaired tone.   ACTIVITY LIMITATIONS: continence and toileting  PARTICIPATION LIMITATIONS: community activity  PERSONAL FACTORS: Age and Time since onset of injury/illness/exacerbation are also affecting patient's functional outcome.   REHAB POTENTIAL: Good  CLINICAL DECISION MAKING: Evolving/moderate complexity  EVALUATION COMPLEXITY: Moderate   GOALS: Goals reviewed with patient? Yes  SHORT  TERM GOALS: Target date: 08/07/2023    Pt will be independent with HEP.   Baseline: Goal status: INITIAL  2.  Pt will be independent with the knack, urge suppression technique, and double voiding in order to improve bladder habits and decrease urinary incontinence.   Baseline:  Goal status: INITIAL  3.  Pt will complete her bladder diary to identify bladder irritants Baseline:  Goal status: INITIAL  4.  Pt will tolerate 30 mins of exercise session without increased pain and fatigue               Baseline:  Goal status: INITIAL  5.  Pt will be independent with use of squatty potty, relaxed toileting mechanics, and improved bowel movement techniques in order to increase ease of bowel movements and complete evacuation.   Baseline:  Goal status: INITIAL    LONG TERM GOALS: Target date: 10/02/2023    Pt will be independent with advanced HEP.   Baseline:  Goal status: INITIAL  2.  Pt will stay dry when she gets up at night in order to be able to get better rest Baseline:  Goal status: INITIAL  3.  Pt will dem 5/5 MMT bilateral hips in order to decrease risk of falls Baseline:  Goal status: INITIAL  4.  Pt will be able to make it to the bathroom 100%  of time without leaking in order to be able to stay active in her community  Baseline:  Goal status: INITIAL  5.  Pt will report at least 5 Bms / week to have a healthy bowel routine and reduced risk of complications  Baseline:  Goal status: met   PLAN:  PT FREQUENCY: 1-2x/week  PT DURATION: 12 weeks  PLANNED INTERVENTIONS: 97110-Therapeutic exercises, 97530- Therapeutic activity, 97112- Neuromuscular re-education, 97535- Self Care, 16109- Manual therapy, [phone]- Electrical stimulation (manual), Patient/Family education, Balance training, Taping, Dry Needling, Joint mobilization, Joint manipulation, Spinal manipulation, Spinal mobilization, Scar mobilization, Vestibular training, Cryotherapy, Moist heat, and Biofeedback  PLAN FOR NEXT SESSION: complete eval , internal and exercises   Darcey Cardy, PT 08/31/2023, 2:59 PM

## 2023-09-07 ENCOUNTER — Encounter: Payer: Self-pay | Admitting: Physical Therapy

## 2023-09-07 ENCOUNTER — Ambulatory Visit: Admitting: Physical Therapy

## 2023-09-07 ENCOUNTER — Other Ambulatory Visit: Payer: Self-pay

## 2023-09-07 DIAGNOSIS — R279 Unspecified lack of coordination: Secondary | ICD-10-CM

## 2023-09-07 DIAGNOSIS — R2689 Other abnormalities of gait and mobility: Secondary | ICD-10-CM

## 2023-09-07 DIAGNOSIS — M6281 Muscle weakness (generalized): Secondary | ICD-10-CM

## 2023-09-07 DIAGNOSIS — R293 Abnormal posture: Secondary | ICD-10-CM

## 2023-09-07 MED ORDER — DICYCLOMINE HCL 10 MG PO CAPS: 10.0000 mg | ORAL_CAPSULE | Freq: Three times a day (TID) | ORAL | 0 refills | Status: DC

## 2023-09-07 NOTE — Therapy (Signed)
 OUTPATIENT PHYSICAL THERAPY FEMALE PELVIC TREATMENT   Patient Name: Tracy Hunt MRN: [id number] DOB:Aug 12, 1945, 78 y.o., female Today's Date: 09/07/2023  END OF SESSION:  PT End of Session - 09/07/23 1448     Visit Number 4    Authorization Type [employer] APPROVED 10 VISITS #[id number] 07/10/2023-01/09/2024    Authorization Time Period 07/10/2023-01/09/2024    PT Start Time 1447    PT Stop Time 1530    PT Time Calculation (min) 43 min    Activity Tolerance Patient tolerated treatment well    Behavior During Therapy [facility] for tasks assessed/performed             Past Medical History:  Diagnosis Date   Acid reflux    Anemia    Arthritis    Bell's palsy 1988   C. difficile diarrhea 03/2022   Complication of anesthesia    Hard to wake up after colonoscopy around 2000, but no issues with surgeries/colonoscopies after that   Diverticulitis    Dry eye    Family history of adverse reaction to anesthesia    mom hard to wake up and had lethargy after anesthesia   Glaucoma    Gout    Hepatitis 1986   secondary to varicella was in hospital for 2 days   Hiatal hernia    Hyperlipemia    Hypertension    Hypothyroidism    Malaria    as as child   Mumps    Pancreatic insufficiency    Per patient and her daughter   Pre-diabetes    Rosacea    Scoliosis    Sjogren's syndrome [facility])    Past Surgical History:  Procedure Laterality Date   ABDOMINAL HYSTERECTOMY  1995   APPENDECTOMY  1995   BIOPSY  04/08/2023   Procedure: BIOPSY;  Surgeon: Daina Drum, MD;  Location: [facility] ENDOSCOPY;  Service: Gastroenterology;;   CATARACT EXTRACTION Bilateral    CHOLECYSTECTOMY  01/16/2012   COLPOCLEISIS N/A 12/05/2022   Procedure: COLPOCLEISIS;  Surgeon: Arma Lamp, MD;  Location: [facility];  Service: Gynecology;  Laterality: N/A;  Total time requested is 2 hours   CYSTOSCOPY N/A 12/05/2022   Procedure: CYSTOSCOPY;  Surgeon: Arma Lamp, MD;   Location: [facility];  Service: Gynecology;  Laterality: N/A;   FLEXIBLE SIGMOIDOSCOPY N/A 04/08/2023   Procedure: FLEXIBLE SIGMOIDOSCOPY;  Surgeon: Daina Drum, MD;  Location: [facility] ENDOSCOPY;  Service: Gastroenterology;  Laterality: N/A;   KNEE ARTHROPLASTY Left 12/27/2010   KNEE ARTHROPLASTY  08/10/2015   Lumbar Radio Frequency Ablation  11/27/2012   L3, L4, L5   Lumbar Radio Requency Ablation  02/05/2014   L3, L4, L5   RECTOCELE REPAIR N/A 12/05/2022   Procedure: Levator plication with perineorrhaphy;  Surgeon: Arma Lamp, MD;  Location: [facility];  Service: Gynecology;  Laterality: N/A;   REVERSE SHOULDER ARTHROPLASTY Left 10/23/2020   Procedure: REVERSE SHOULDER ARTHROPLASTY;  Surgeon: Winston Hawking, MD;  Location: [facility] ORS;  Service: Orthopedics;  Laterality: Left;  with ISB   steroid injections     L3,L4, L5   XI ROBOTIC ASSISTED VENTRAL HERNIA N/A 06/13/2023   Procedure: ROBOTIC INCISIONAL HERNIA REPAIR WITH MESH (TAPP);  Surgeon: Shela Derby, MD;  Location: [facility] OR;  Service: General;  Laterality: N/A;   Patient Active Problem List   Diagnosis Date Noted   Syncope 04/06/2023   Colitis 04/05/2023   Hyperlipidemia 12/23/2022   Enteritis due to Clostridium difficile 04/22/2022  C. difficile diarrhea 04/19/2022   Abnormal CT of the abdomen 04/19/2022   Diverticulitis 04/05/2022   Acute diverticulitis 04/04/2022   Abdominal pain 04/04/2022   HTN (hypertension) 04/04/2022   Hypokalemia 04/04/2022   Hypocalcemia 04/04/2022   Right foot pain 04/04/2022   Glaucoma 04/04/2022   Abnormality of gait due to impairment of balance 06/22/2021   H/O total shoulder replacement, left 10/23/2020   Acute hepatitis 04/24/2020   Arthritis 04/24/2020   Hypertensive disorder 04/24/2020   Hypothyroidism 04/24/2020   Peptic ulcer 04/24/2020   Lumbar radiculopathy 10/04/2017    PCP: Lonzie Robins, MD PCP - General  REFERRING PROVIDER:  Daina Drum, MD  REFERRING DIAG: K86.89 (ICD-10-CM) - Pancreatic insufficiency K59.00 (ICD-10-CM) - Constipation, unspecified constipation type K62.6 (ICD-10-CM) - Solitary rectal ulcer Z87.19 (ICD-10-CM) - History of colitis  THERAPY DIAG:  Muscle weakness (generalized)  Other abnormalities of gait and mobility  Abnormal posture  Unspecified lack of coordination  Rationale for Evaluation and Treatment: Rehabilitation  ONSET DATE: 3-4 years ago 2021  SUBJECTIVE:                                                                                                                                                                                           SUBJECTIVE STATEMENT:  Pt reports that she felt good after last visit.  She is not constipated anymore.  Feels like she is improving,    PAIN: no pain  PRECAUTIONS: None  RED FLAGS: None   WEIGHT BEARING RESTRICTIONS: Yes 20 lbs  FALLS:  Has patient fallen in last 6 months? No  OCCUPATION: volunteers  ACTIVITY LEVEL : pretty active, does activities at [employer], takes art classes and piano lessons, volunteers  PLOF: Independent  PATIENT GOALS: to make it to the bathroom   PERTINENT HISTORY:  ABDOMINAL HYSTERECTOMY  1995    APPENDECTOMY  1995    BIOPSY  04/08/2023 Procedure: BIOPSY;  Surgeon: Daina Drum, MD;  Location: [facility] ENDOSCOPY;  Service: Gastroenterology;;   CATARACT EXTRACTION Bilateral     CHOLECYSTECTOMY  01/16/2012    COLPOCLEISIS       Sexual abuse: No  BOWEL MOVEMENT: sometimes constipation, resolves quickly Fiber supplement/laxative Yes Miralax  every day  URINATION: Pain with urination: No Fully empty bladder: No, has to sit there for a little bit, eventually everything comes out Stream: Strong usually, sometimes weak Urgency: No Frequency: no Leakage: Urge to void and Walking to the bathroom Pads: Yes: especially when she travels   INTERCOURSE: not  active  Laxative:  PREGNANCY: Vaginal deliveries 2 Tearing Yes:     PROLAPSE: None   OBJECTIVE:  Note:  Objective measures were completed at Evaluation unless otherwise noted.  PATIENT SURVEYS:    UIQ-7 - 5   COGNITION: Overall cognitive status: Within functional limits for tasks assessed     SENSATION: Light touch: Appears intact  LUMBAR SPECIAL TESTS:  Single leg stance test: Positive for weakness bilateral hips and knees  GAIT: Assistive device utilized: None Comments: decreased balance, slow gait  POSTURE: rounded shoulders, forward head, and flexed trunk    LUMBARAROM/PROM:  A/PROM A/PROM  Eval-% available  Flexion 50  Extension 50  Right lateral flexion 75  Left lateral flexion 75  Right rotation 75  Left rotation 75   (Blank rows = not tested)  LOWER EXTREMITY [id number]:[id number] functional limitations   LOWER EXTREMITY MMT: bilateral hip weakness present  left 4/5. Right 4-/5 PALPATION:   General: able to engage lower abdominals with transverse abdominis breath and dem diaphragmatic breathing  Pelvic Alignment: uneven  Abdominal: tight and tender abdominal scar ( week 4 post surgery- hernia repair)                External Perineal Exam: dryness present- low estrogen                             Internal Pelvic Floor: colpocleisis present, able to assess on left side of vaginal canal   Patient confirms identification and approves PT to assess internal pelvic floor and treatment yes Patient confirms identification and approves PT to assess internal pelvic floor and treatment Yes No emotional/communication barriers or cognitive limitation. Patient is motivated to learn. Patient understands and agrees with treatment goals and plan. PT explains patient will be examined in standing, sitting, and lying down to see how their muscles and joints work. When they are ready, they will be asked to remove their underwear so PT can examine their perineum. The patient is  also given the option of providing their own chaperone as one is not provided in our facility. The patient also has the right and is explained the right to defer or refuse any part of the evaluation or treatment including the internal exam. With the patient's consent, PT will use one gloved finger to gently assess the muscles of the pelvic floor, seeing how well it contracts and relaxes and if there is muscle symmetry. After, the patient will get dressed and PT and patient will discuss exam findings and plan of care. PT and patient discuss plan of care, schedule, attendance policy and HEP activities.     PELVIC MMT:   MMT eval  Vaginal 0/5  Internal Anal Sphincter   External Anal Sphincter   Puborectalis   Diastasis Recti no  (Blank rows = not tested)        TONE: Low tone  PROLAPSE: None noticed in hooklying  TODAY'S TREATMENT:  DATE:  09/07/2023 Nu step 8 mins Pulleys- rowing and extensions with transverse abdominis breath - 1 plate 20 reps- difficult Hip adduction with ball with yellow thera band  with transverse abdominis breath  20 reps    08/31/2023 Neuro reed- seated horizontal abduction with red theraband with hip adduction with ball with transverse abdominis breath  Seated ball press with pelvic floor lift off table      EVAL see below  Neuro reed- ball press with pelvic floor contraction supine/ seated  Seated horizontal abduction with red theraband with transverse abdominis breath   PATIENT EDUCATION/ there acts:  Education details: fiber, relevant anatomy, HEP, exam findings, expectations of PT Person educated: Patient Education method: Explanation, Demonstration, Tactile cues, Verbal cues, and Handouts Education comprehension: verbalized understanding, returned demonstration, verbal cues required, tactile cues required, and needs further  education  HOME EXERCISE PROGRAM: Access Code: [email or web address] URL: [email or web address] Date: 07/10/2023 Prepared by: Jameson Mcburney Kenslee Achorn  Exercises - Horizontal abd with TB with TRA breath  - 1 x daily - 7 x weekly - 3 sets - 10 reps - Sit to Stand with Pelvic Floor Contraction  - 1 x daily - 7 x weekly - 3 sets - 10 reps  Patient Education - Bowel Emptying Techniques - Lifestyle Changes to Support Urinary and Bladder Health - Urinary Urge Control Techniques  ASSESSMENT:  CLINICAL IMPRESSION: Patient is a 78 y.o. F who was seen today for physical therapy  treatment for urinary incontinence.and constipation. She was originally referred for constipation, however it is better and she mentioned urinary incontinence when she cannot make it to the bathroom when she wakes up at night. She presents with decreased awareness of her pelvic floor, bilateral lower extremity weakness, difficulty with ambulation and poor balance, has a recent history of several abdominal and pelvic surgeries and will benefit from PT to improve strength and reduce urinary incontinence. Findings notable for pelvic floor and core weakness and decreased pelvic floor awareness.  She is doing fairly well with her exercises. Added her new exercises to the medbridge  Some difficulty with core coordination. Discussed importance of consistency with HEP, she can split exercises into 2 sessions. Will continue to benefit from PT.       OBJECTIVE IMPAIRMENTS: Abnormal gait, cardiopulmonary status limiting activity, decreased activity tolerance, decreased balance, decreased coordination, decreased endurance, decreased mobility, difficulty walking, decreased ROM, decreased strength, and impaired tone.   ACTIVITY LIMITATIONS: continence and toileting  PARTICIPATION LIMITATIONS: community activity  PERSONAL FACTORS: Age and Time since onset of injury/illness/exacerbation are also affecting patient's functional outcome.   REHAB  POTENTIAL: Good  CLINICAL DECISION MAKING: Evolving/moderate complexity  EVALUATION COMPLEXITY: Moderate   GOALS: Goals reviewed with patient? Yes  SHORT TERM GOALS: Target date: 08/07/2023    Pt will be independent with HEP.   Baseline: Goal status: progressing  2.  Pt will be independent with the knack, urge suppression technique, and double voiding in order to improve bladder habits and decrease urinary incontinence.   Baseline:  Goal status: INITIAL  3.  Pt will complete her bladder diary to identify bladder irritants Baseline:  Goal status: INITIAL  4.  Pt will tolerate 30 mins of exercise session without increased pain and fatigue               Baseline:  Goal status: INITIAL  5.  Pt will be independent with use of squatty potty, relaxed toileting mechanics, and improved bowel movement techniques in order to increase ease of bowel movements  and complete evacuation.   Baseline:  Goal status: INITIAL    LONG TERM GOALS: Target date: 10/02/2023    Pt will be independent with advanced HEP.   Baseline:  Goal status: INITIAL  2.  Pt will stay dry when she gets up at night in order to be able to get better rest Baseline:  Goal status: INITIAL  3.  Pt will dem 5/5 MMT bilateral hips in order to decrease risk of falls Baseline:  Goal status: INITIAL  4.  Pt will be able to make it to the bathroom 100% of time without leaking in order to be able to stay active in her community  Baseline:  Goal status: INITIAL  5.  Pt will report at least 5 Bms / week to have a healthy bowel routine and reduced risk of complications  Baseline:  Goal status: met   PLAN:  PT FREQUENCY: 1-2x/week  PT DURATION: 12 weeks  PLANNED INTERVENTIONS: 97110-Therapeutic exercises, 97530- Therapeutic activity, 97112- Neuromuscular re-education, 97535- Self Care, 16109- Manual therapy, [phone]- Electrical stimulation (manual), Patient/Family education, Balance training, Taping, Dry Needling,  Joint mobilization, Joint manipulation, Spinal manipulation, Spinal mobilization, Scar mobilization, Vestibular training, Cryotherapy, Moist heat, and Biofeedback  PLAN FOR NEXT SESSION: complete eval , internal and exercises   Akin Yi, PT 09/07/2023, 3:08 PM

## 2023-09-14 ENCOUNTER — Ambulatory Visit: Admitting: Physical Therapy

## 2023-09-14 ENCOUNTER — Encounter: Payer: Self-pay | Admitting: Physical Therapy

## 2023-09-14 DIAGNOSIS — M6281 Muscle weakness (generalized): Secondary | ICD-10-CM | POA: Diagnosis not present

## 2023-09-14 DIAGNOSIS — R2689 Other abnormalities of gait and mobility: Secondary | ICD-10-CM | POA: Diagnosis not present

## 2023-09-14 DIAGNOSIS — R293 Abnormal posture: Secondary | ICD-10-CM

## 2023-09-14 DIAGNOSIS — R279 Unspecified lack of coordination: Secondary | ICD-10-CM

## 2023-09-14 NOTE — Therapy (Signed)
 OUTPATIENT PHYSICAL THERAPY FEMALE PELVIC TREATMENT   Patient Name: Tracy Hunt MRN: [id number] DOB:08-23-1945, 77 y.o., female Today's Date: 09/14/2023  END OF SESSION:  PT End of Session - 09/14/23 1448     Visit Number 5    Authorization Type [employer] APPROVED 10 VISITS [id number] 07/10/2023-01/09/2024    Authorization Time Period 07/10/2023-01/09/2024    Authorization - Visit Number 4    PT Start Time 1447    PT Stop Time 1530    PT Time Calculation (min) 43 min    Activity Tolerance Patient tolerated treatment well    Behavior During Therapy [facility] for tasks assessed/performed              Past Medical History:  Diagnosis Date   Acid reflux    Anemia    Arthritis    Bell's palsy 1988   C. difficile diarrhea 03/2022   Complication of anesthesia    Hard to wake up after colonoscopy around 2000, but no issues with surgeries/colonoscopies after that   Diverticulitis    Dry eye    Family history of adverse reaction to anesthesia    mom hard to wake up and had lethargy after anesthesia   Glaucoma    Gout    Hepatitis 1986   secondary to varicella was in hospital for 2 days   Hiatal hernia    Hyperlipemia    Hypertension    Hypothyroidism    Malaria    as as child   Mumps    Pancreatic insufficiency    Per patient and her daughter   Pre-diabetes    Rosacea    Scoliosis    Sjogren's syndrome [facility])    Past Surgical History:  Procedure Laterality Date   ABDOMINAL HYSTERECTOMY  1995   APPENDECTOMY  1995   BIOPSY  04/08/2023   Procedure: BIOPSY;  Surgeon: Federico Rosario BROCKS, MD;  Location: [facility] ENDOSCOPY;  Service: Gastroenterology;;   CATARACT EXTRACTION Bilateral    CHOLECYSTECTOMY  01/16/2012   COLPOCLEISIS N/A 12/05/2022   Procedure: COLPOCLEISIS;  Surgeon: Marilynne Rosaline SAILOR, MD;  Location: [facility];  Service: Gynecology;  Laterality: N/A;  Total time requested is 2 hours   CYSTOSCOPY N/A 12/05/2022   Procedure: CYSTOSCOPY;   Surgeon: Marilynne Rosaline SAILOR, MD;  Location: [facility];  Service: Gynecology;  Laterality: N/A;   FLEXIBLE SIGMOIDOSCOPY N/A 04/08/2023   Procedure: FLEXIBLE SIGMOIDOSCOPY;  Surgeon: Federico Rosario BROCKS, MD;  Location: [facility] ENDOSCOPY;  Service: Gastroenterology;  Laterality: N/A;   KNEE ARTHROPLASTY Left 12/27/2010   KNEE ARTHROPLASTY  08/10/2015   Lumbar Radio Frequency Ablation  11/27/2012   L3, L4, L5   Lumbar Radio Requency Ablation  02/05/2014   L3, L4, L5   RECTOCELE REPAIR N/A 12/05/2022   Procedure: Levator plication with perineorrhaphy;  Surgeon: Marilynne Rosaline SAILOR, MD;  Location: [facility];  Service: Gynecology;  Laterality: N/A;   REVERSE SHOULDER ARTHROPLASTY Left 10/23/2020   Procedure: REVERSE SHOULDER ARTHROPLASTY;  Surgeon: Kay Kemps, MD;  Location: [facility]S;  Service: Orthopedics;  Laterality: Left;  with ISB   steroid injections     L3,L4, L5   XI ROBOTIC ASSISTED VENTRAL HERNIA N/A 06/13/2023   Procedure: ROBOTIC INCISIONAL HERNIA REPAIR WITH MESH (TAPP);  Surgeon: Rubin Calamity, MD;  Location: [facility] OR;  Service: General;  Laterality: N/A;   Patient Active Problem List   Diagnosis Date Noted   Syncope 04/06/2023   Colitis 04/05/2023   Hyperlipidemia 12/23/2022  Enteritis due to Clostridium difficile 04/22/2022   C. difficile diarrhea 04/19/2022   Abnormal CT of the abdomen 04/19/2022   Diverticulitis 04/05/2022   Acute diverticulitis 04/04/2022   Abdominal pain 04/04/2022   HTN (hypertension) 04/04/2022   Hypokalemia 04/04/2022   Hypocalcemia 04/04/2022   Right foot pain 04/04/2022   Glaucoma 04/04/2022   Abnormality of gait due to impairment of balance 06/22/2021   H/O total shoulder replacement, left 10/23/2020   Acute hepatitis 04/24/2020   Arthritis 04/24/2020   Hypertensive disorder 04/24/2020   Hypothyroidism 04/24/2020   Peptic ulcer 04/24/2020   Lumbar radiculopathy 10/04/2017    PCP: Janey Santos, MD PCP -  General  REFERRING PROVIDER: Federico Rosario BROCKS, MD  REFERRING DIAG: K86.89 (ICD-10-CM) - Pancreatic insufficiency K59.00 (ICD-10-CM) - Constipation, unspecified constipation type K62.6 (ICD-10-CM) - Solitary rectal ulcer Z87.19 (ICD-10-CM) - History of colitis  THERAPY DIAG:  Muscle weakness (generalized)  Abnormal posture  Unspecified lack of coordination  Other abnormalities of gait and mobility  Rationale for Evaluation and Treatment: Rehabilitation  ONSET DATE: 3-4 years ago 2021  SUBJECTIVE:                                                                                                                                                                                           SUBJECTIVE STATEMENT:  Pt reports that she is tired today, just had a personal training session,  has not had any problems recently.  Belly feels ok.  Reports that she sometimes has seconds before she makes it to the bathroom  PAIN: no pain  PRECAUTIONS: None  RED FLAGS: None   WEIGHT BEARING RESTRICTIONS: Yes 20 lbs  FALLS:  Has patient fallen in last 6 months? No  OCCUPATION: volunteers  ACTIVITY LEVEL : pretty active, does activities at [employer], takes art classes and piano lessons, volunteers  PLOF: Independent  PATIENT GOALS: to make it to the bathroom   PERTINENT HISTORY:  ABDOMINAL HYSTERECTOMY  1995    APPENDECTOMY  1995    BIOPSY  04/08/2023 Procedure: BIOPSY;  Surgeon: Federico Rosario BROCKS, MD;  Location[facility] ENDOSCOPY;  Service: Gastroenterology;;   CATARACT EXTRACTION Bilateral     CHOLECYSTECTOMY  01/16/2012    COLPOCLEISIS       Sexual abuse: No  BOWEL MOVEMENT: sometimes constipation, resolves quickly Fiber supplement/laxative Yes Miralax  every day  URINATION: Pain with urination: No Fully empty bladder: No, has to sit there for a little bit, eventually everything comes out Stream: Strong usually, sometimes weak Urgency: No Frequency: no Leakage: Urge to void and  Walking to the bathroom Pads: Yes: especially when she travels  INTERCOURSE: not active  Laxative:  PREGNANCY: Vaginal deliveries 2 Tearing Yes:     PROLAPSE: None   OBJECTIVE:  Note: Objective measures were completed at Evaluation unless otherwise noted.  PATIENT SURVEYS:    UIQ-7 - 5   COGNITION: Overall cognitive status: Within functional limits for tasks assessed     SENSATION: Light touch: Appears intact  LUMBAR SPECIAL TESTS:  Single leg stance test: Positive for weakness bilateral hips and knees  GAIT: Assistive device utilized: None Comments: decreased balance, slow gait  POSTURE: rounded shoulders, forward head, and flexed trunk    LUMBARAROM/PROM:  A/PROM A/PROM  Eval-% available  Flexion 50  Extension 50  Right lateral flexion 75  Left lateral flexion 75  Right rotation 75  Left rotation 75   (Blank rows = not tested)  LOWER EXTREMITY MNF:tpuypw functional limitations   LOWER EXTREMITY MMT: bilateral hip weakness present  left 4/5. Right 4-/5 PALPATION:   General: able to engage lower abdominals with transverse abdominis breath and dem diaphragmatic breathing  Pelvic Alignment: uneven  Abdominal: tight and tender abdominal scar ( week 4 post surgery- hernia repair)                External Perineal Exam: dryness present- low estrogen                             Internal Pelvic Floor: colpocleisis present, able to assess on left side of vaginal canal   Patient confirms identification and approves PT to assess internal pelvic floor and treatment yes Patient confirms identification and approves PT to assess internal pelvic floor and treatment Yes No emotional/communication barriers or cognitive limitation. Patient is motivated to learn. Patient understands and agrees with treatment goals and plan. PT explains patient will be examined in standing, sitting, and lying down to see how their muscles and joints work. When they are ready, they will be  asked to remove their underwear so PT can examine their perineum. The patient is also given the option of providing their own chaperone as one is not provided in our facility. The patient also has the right and is explained the right to defer or refuse any part of the evaluation or treatment including the internal exam. With the patient's consent, PT will use one gloved finger to gently assess the muscles of the pelvic floor, seeing how well it contracts and relaxes and if there is muscle symmetry. After, the patient will get dressed and PT and patient will discuss exam findings and plan of care. PT and patient discuss plan of care, schedule, attendance policy and HEP activities.     PELVIC MMT:   MMT eval  Vaginal 0/5  Internal Anal Sphincter   External Anal Sphincter   Puborectalis   Diastasis Recti no  (Blank rows = not tested)        TONE: Low tone  PROLAPSE: None noticed in hooklying  TODAY'S TREATMENT:  DATE:  09/14/2023 Neurom reed- Nu step 16 mins with therapist present to discuss progress Pulleys 1 plate -10 reps with transverse abdominis breath  difficult Shoulder extensions with transverse abdominis breath red t band 20 reps Hip adduction with ball seated with red t band at a stretch for feedback 25 reps Ball press unilat 15 reps with transverse abdominis breath   Ball step ons with transverse abdominis breath 5 reps   09/07/2023 Nu step 8 mins Pulleys- rowing and extensions with transverse abdominis breath - 1 plate 20 reps- difficult Hip adduction with ball with yellow thera band  with transverse abdominis breath  20 reps    08/31/2023 Neuro reed- seated horizontal abduction with red theraband with hip adduction with ball with transverse abdominis breath  Seated ball press with pelvic floor lift off table      EVAL see below  Neuro  reed- ball press with pelvic floor contraction supine/ seated  Seated horizontal abduction with red theraband with transverse abdominis breath   PATIENT EDUCATION/ there acts:  Education details: fiber, relevant anatomy, HEP, exam findings, expectations of PT Person educated: Patient Education method: Explanation, Demonstration, Tactile cues, Verbal cues, and Handouts Education comprehension: verbalized understanding, returned demonstration, verbal cues required, tactile cues required, and needs further education  HOME EXERCISE PROGRAM: Access Code: [email or web address] URL: [email or web address] Date: 07/10/2023 Prepared by: Cori Shawndrea Rutkowski  Exercises - Horizontal abd with TB with TRA breath  - 1 x daily - 7 x weekly - 3 sets - 10 reps - Sit to Stand with Pelvic Floor Contraction  - 1 x daily - 7 x weekly - 3 sets - 10 reps  Patient Education - Bowel Emptying Techniques - Lifestyle Changes to Support Urinary and Bladder Health - Urinary Urge Control Techniques  ASSESSMENT:  CLINICAL IMPRESSION: She is doing well with her exercises, tired today after her personal training session at [employer].  Added her new exercises today. Some difficulty with core coordination, improving,  req some VC's and TC's . Discussed importance of consistency with HEP, she can split exercises into 2 sessions. Has not had any leaking but has seconds to make it to the bathroom at times, No constipation present.  Will continue to benefit from PT.       OBJECTIVE IMPAIRMENTS: Abnormal gait, cardiopulmonary status limiting activity, decreased activity tolerance, decreased balance, decreased coordination, decreased endurance, decreased mobility, difficulty walking, decreased ROM, decreased strength, and impaired tone.   ACTIVITY LIMITATIONS: continence and toileting  PARTICIPATION LIMITATIONS: community activity  PERSONAL FACTORS: Age and Time since onset of injury/illness/exacerbation are also affecting  patient's functional outcome.   REHAB POTENTIAL: Good  CLINICAL DECISION MAKING: Evolving/moderate complexity  EVALUATION COMPLEXITY: Moderate   GOALS: Goals reviewed with patient? Yes  SHORT TERM GOALS: Target date: 08/07/2023    Pt will be independent with HEP.   Baseline: Goal status: progressing  2.  Pt will be independent with the knack, urge suppression technique, and double voiding in order to improve bladder habits and decrease urinary incontinence.   Baseline:  Goal status: INITIAL  3.  Pt will complete her bladder diary to identify bladder irritants Baseline:  Goal status: INITIAL  4.  Pt will tolerate 30 mins of exercise session without increased pain and fatigue               Baseline:  Goal status: progressing  5.  Pt will be independent with use of squatty potty, relaxed toileting mechanics, and improved bowel movement techniques in order to  increase ease of bowel movements and complete evacuation.   Baseline:  Goal status: met    LONG TERM GOALS: Target date: 10/02/2023    Pt will be independent with advanced HEP.   Baseline:  Goal status: INITIAL  2.  Pt will stay dry when she gets up at night in order to be able to get better rest Baseline:  Goal status: INITIAL  3.  Pt will dem 5/5 MMT bilateral hips in order to decrease risk of falls Baseline:  Goal status: INITIAL  4.  Pt will be able to make it to the bathroom 100% of time without leaking in order to be able to stay active in her community  Baseline:  Goal status: INITIAL  5.  Pt will report at least 5 Bms / week to have a healthy bowel routine and reduced risk of complications  Baseline:  Goal status: met   PLAN:  PT FREQUENCY: 1-2x/week  PT DURATION: 12 weeks  PLANNED INTERVENTIONS: 97110-Therapeutic exercises, 97530- Therapeutic activity, 97112- Neuromuscular re-education, 97535- Self Care, [id number]- Manual therapy, [phone]- Electrical stimulation (manual), Patient/Family education,  Balance training, Taping, Dry Needling, Joint mobilization, Joint manipulation, Spinal manipulation, Spinal mobilization, Scar mobilization, Vestibular training, Cryotherapy, Moist heat, and Biofeedback  PLAN FOR NEXT SESSION: exercises for core strengthening   Jakim Drapeau, PT 09/14/2023, 2:52 PM

## 2023-09-26 DIAGNOSIS — H04123 Dry eye syndrome of bilateral lacrimal glands: Secondary | ICD-10-CM | POA: Diagnosis not present

## 2023-09-26 DIAGNOSIS — Z961 Presence of intraocular lens: Secondary | ICD-10-CM | POA: Diagnosis not present

## 2023-09-26 DIAGNOSIS — H401131 Primary open-angle glaucoma, bilateral, mild stage: Secondary | ICD-10-CM | POA: Diagnosis not present

## 2023-10-24 ENCOUNTER — Encounter: Payer: Self-pay | Admitting: Neurology

## 2023-10-30 ENCOUNTER — Ambulatory Visit: Attending: Internal Medicine | Admitting: Physical Therapy

## 2023-10-30 ENCOUNTER — Encounter: Payer: Self-pay | Admitting: Physical Therapy

## 2023-10-30 DIAGNOSIS — R279 Unspecified lack of coordination: Secondary | ICD-10-CM | POA: Insufficient documentation

## 2023-10-30 DIAGNOSIS — M6281 Muscle weakness (generalized): Secondary | ICD-10-CM | POA: Insufficient documentation

## 2023-10-30 DIAGNOSIS — R2689 Other abnormalities of gait and mobility: Secondary | ICD-10-CM | POA: Diagnosis not present

## 2023-10-30 DIAGNOSIS — R293 Abnormal posture: Secondary | ICD-10-CM | POA: Diagnosis not present

## 2023-10-30 NOTE — Therapy (Addendum)
 OUTPATIENT PHYSICAL THERAPY FEMALE PELVIC TREATMENT/ Discharge    Patient Name: Tracy Hunt MRN: [id number] DOB:[birth year], 78 y.o., female Today's Date: 10/30/2023  END OF SESSION:  PT End of Session - 10/30/23 1536     Visit Number 6    Authorization Type [employer] APPROVED 10 VISITS #[id number] 07/10/2023-01/09/2024    Authorization Time Period 07/10/2023-01/09/2024    Authorization - Visit Number 5    Authorization - Number of Visits 10    Progress Note Due on Visit 10    PT Start Time 1533    PT Stop Time 1613    PT Time Calculation (min) 40 min    Activity Tolerance Patient tolerated treatment well    Behavior During Therapy [facility] for tasks assessed/performed              Past Medical History:  Diagnosis Date   Acid reflux    Anemia    Arthritis    Bell's palsy 1988   C. difficile diarrhea 03/2022   Complication of anesthesia    Hard to wake up after colonoscopy around 2000, but no issues with surgeries/colonoscopies after that   Diverticulitis    Dry eye    Family history of adverse reaction to anesthesia    mom hard to wake up and had lethargy after anesthesia   Glaucoma    Gout    Hepatitis 1986   secondary to varicella was in hospital for 2 days   Hiatal hernia    Hyperlipemia    Hypertension    Hypothyroidism    Malaria    as as child   Mumps    Pancreatic insufficiency    Per patient and her daughter   Pre-diabetes    Rosacea    Scoliosis    Sjogren's syndrome [facility])    Past Surgical History:  Procedure Laterality Date   ABDOMINAL HYSTERECTOMY  1995   APPENDECTOMY  1995   BIOPSY  04/08/2023   Procedure: BIOPSY;  Surgeon: Federico Rosario BROCKS, MD;  Location: [facility] ENDOSCOPY;  Service: Gastroenterology;;   CATARACT EXTRACTION Bilateral    CHOLECYSTECTOMY  01/16/2012   COLPOCLEISIS N/A 12/05/2022   Procedure: COLPOCLEISIS;  Surgeon: Marilynne Rosaline SAILOR, MD;  Location: [facility];  Service: Gynecology;  Laterality: N/A;  Total  time requested is 2 hours   CYSTOSCOPY N/A 12/05/2022   Procedure: CYSTOSCOPY;  Surgeon: Marilynne Rosaline SAILOR, MD;  Location: [facility];  Service: Gynecology;  Laterality: N/A;   FLEXIBLE SIGMOIDOSCOPY N/A 04/08/2023   Procedure: FLEXIBLE SIGMOIDOSCOPY;  Surgeon: Federico Rosario BROCKS, MD;  Location: [facility] ENDOSCOPY;  Service: Gastroenterology;  Laterality: N/A;   KNEE ARTHROPLASTY Left 12/27/2010   KNEE ARTHROPLASTY  08/10/2015   Lumbar Radio Frequency Ablation  11/27/2012   L3, L4, L5   Lumbar Radio Requency Ablation  02/05/2014   L3, L4, L5   RECTOCELE REPAIR N/A 12/05/2022   Procedure: Levator plication with perineorrhaphy;  Surgeon: Marilynne Rosaline SAILOR, MD;  Location: [facility];  Service: Gynecology;  Laterality: N/A;   REVERSE SHOULDER ARTHROPLASTY Left 10/23/2020   Procedure: REVERSE SHOULDER ARTHROPLASTY;  Surgeon: Kay Kemps, MD;  Location: [facility] ORS;  Service: Orthopedics;  Laterality: Left;  with ISB   steroid injections     L3,L4, L5   XI ROBOTIC ASSISTED VENTRAL HERNIA N/A 06/13/2023   Procedure: ROBOTIC INCISIONAL HERNIA REPAIR WITH MESH (TAPP);  Surgeon: Rubin Calamity, MD;  Location: [facility] OR;  Service: General;  Laterality: N/A;   Patient Active  Problem List   Diagnosis Date Noted   Syncope 04/06/2023   Colitis 04/05/2023   Hyperlipidemia 12/23/2022   Enteritis due to Clostridium difficile 04/22/2022   C. difficile diarrhea 04/19/2022   Abnormal CT of the abdomen 04/19/2022   Diverticulitis 04/05/2022   Acute diverticulitis 04/04/2022   Abdominal pain 04/04/2022   HTN (hypertension) 04/04/2022   Hypokalemia 04/04/2022   Hypocalcemia 04/04/2022   Right foot pain 04/04/2022   Glaucoma 04/04/2022   Abnormality of gait due to impairment of balance 06/22/2021   H/O total shoulder replacement, left 10/23/2020   Acute hepatitis 04/24/2020   Arthritis 04/24/2020   Hypertensive disorder 04/24/2020   Hypothyroidism 04/24/2020   Peptic ulcer  04/24/2020   Lumbar radiculopathy 10/04/2017    PCP: Janey Santos, MD PCP - General  REFERRING PROVIDER: Federico Rosario BROCKS, MD  REFERRING DIAG: K86.89 (ICD-10-CM) - Pancreatic insufficiency K59.00 (ICD-10-CM) - Constipation, unspecified constipation type K62.6 (ICD-10-CM) - Solitary rectal ulcer Z87.19 (ICD-10-CM) - History of colitis  THERAPY DIAG:  Muscle weakness (generalized)  Unspecified lack of coordination  Other abnormalities of gait and mobility  Abnormal posture  Rationale for Evaluation and Treatment: Rehabilitation  ONSET DATE: 3-4 years ago 2021  SUBJECTIVE:                                                                                                                                                                                           SUBJECTIVE STATEMENT:  Patient reports that she feels good, she has been really busy.  She goes to the gym once/ week Gets up at night one time at night to pee Has no constipation, has some irregularity Still taking Miralax  most days, playing with it.  Still has a Systems analyst Patient reports that she can make it to the bathroom, that she is doing well.    PAIN: no pain  PRECAUTIONS: None  RED FLAGS: None   WEIGHT BEARING RESTRICTIONS: Yes 20 lbs  FALLS:  Has patient fallen in last 6 months? No  OCCUPATION: volunteers  ACTIVITY LEVEL : pretty active, does activities at [employer], takes art classes and piano lessons, volunteers  PLOF: Independent  PATIENT GOALS: to make it to the bathroom   PERTINENT HISTORY:  ABDOMINAL HYSTERECTOMY  1995    APPENDECTOMY  1995    BIOPSY  04/08/2023 Procedure: BIOPSY;  Surgeon: Federico Rosario BROCKS, MD;  Location: [facility] ENDOSCOPY;  Service: Gastroenterology;;   CATARACT EXTRACTION Bilateral     CHOLECYSTECTOMY  01/16/2012    COLPOCLEISIS       Sexual abuse: No  BOWEL MOVEMENT: sometimes constipation, resolves quickly Fiber supplement/laxative Yes Miralax  every  day  URINATION: Pain with urination: No Fully empty bladder: No, has to sit there for a little bit, eventually everything comes out Stream: Strong usually, sometimes weak Urgency: No Frequency: no Leakage: Urge to void and Walking to the bathroom Pads: Yes: especially when she travels   INTERCOURSE: not active  Laxative:  PREGNANCY: Vaginal deliveries 2 Tearing Yes:     PROLAPSE: None   OBJECTIVE:  Note: Objective measures were completed at Evaluation unless otherwise noted.  PATIENT SURVEYS:    UIQ-7 - 5   COGNITION: Overall cognitive status: Within functional limits for tasks assessed     SENSATION: Light touch: Appears intact  LUMBAR SPECIAL TESTS:  Single leg stance test: Positive for weakness bilateral hips and knees  GAIT: Assistive device utilized: None Comments: decreased balance, slow gait  POSTURE: rounded shoulders, forward head, and flexed trunk    LUMBARAROM/PROM:  A/PROM A/PROM  Eval-% available  Flexion 50  Extension 50  Right lateral flexion 75  Left lateral flexion 75  Right rotation 75  Left rotation 75   (Blank rows = not tested)  LOWER EXTREMITY MNF:tpuypw functional limitations   LOWER EXTREMITY MMT: bilateral hip weakness present  left 4/5. Right 4-/5 PALPATION:   General: able to engage lower abdominals with transverse abdominis breath and dem diaphragmatic breathing  Pelvic Alignment: uneven  Abdominal: tight and tender abdominal scar ( week 4 post surgery- hernia repair)                External Perineal Exam: dryness present- low estrogen                             Internal Pelvic Floor: colpocleisis present, able to assess on left side of vaginal canal   Patient confirms identification and approves PT to assess internal pelvic floor and treatment yes Patient confirms identification and approves PT to assess internal pelvic floor and treatment Yes No emotional/communication barriers or cognitive limitation. Patient is  motivated to learn. Patient understands and agrees with treatment goals and plan. PT explains patient will be examined in standing, sitting, and lying down to see how their muscles and joints work. When they are ready, they will be asked to remove their underwear so PT can examine their perineum. The patient is also given the option of providing their own chaperone as one is not provided in our facility. The patient also has the right and is explained the right to defer or refuse any part of the evaluation or treatment including the internal exam. With the patient's consent, PT will use one gloved finger to gently assess the muscles of the pelvic floor, seeing how well it contracts and relaxes and if there is muscle symmetry. After, the patient will get dressed and PT and patient will discuss exam findings and plan of care. PT and patient discuss plan of care, schedule, attendance policy and HEP activities.     PELVIC MMT:   MMT eval  Vaginal 0/5  Internal Anal Sphincter   External Anal Sphincter   Puborectalis   Diastasis Recti no  (Blank rows = not tested)        TONE: Low tone  PROLAPSE: None noticed in hooklying  TODAY'S TREATMENT:  DATE:  10/30/2023 Nu step 15 mins with therapist present to discuss progress STS with transverse abdominis breath with green theraband  Farmer's carry #5 2 laps Review of progress, HEP, goals Ball press into wall 10 reps bilat     09/14/2023 Neurom reed- Nu step 16 mins with therapist present to discuss progress Pulleys 1 plate -10 reps with transverse abdominis breath  difficult Shoulder extensions with transverse abdominis breath red t band 20 reps Hip adduction with ball seated with red t band at a stretch for feedback 25 reps Ball press unilat 15 reps with transverse abdominis breath   Ball step ons with transverse  abdominis breath 5 reps   09/07/2023 Nu step 8 mins Pulleys- rowing and extensions with transverse abdominis breath - 1 plate 20 reps- difficult Hip adduction with ball with yellow thera band  with transverse abdominis breath  20 reps    08/31/2023 Neuro reed- seated horizontal abduction with red theraband with hip adduction with ball with transverse abdominis breath  Seated ball press with pelvic floor lift off table      EVAL see below  Neuro reed- ball press with pelvic floor contraction supine/ seated  Seated horizontal abduction with red theraband with transverse abdominis breath   PATIENT EDUCATION/ there acts:  Education details: fiber, relevant anatomy, HEP, exam findings, expectations of PT Person educated: Patient Education method: Explanation, Demonstration, Tactile cues, Verbal cues, and Handouts Education comprehension: verbalized understanding, returned demonstration, verbal cues required, tactile cues required, and needs further education  HOME EXERCISE PROGRAM: Access Code: [email or web address] URL: [email or web address] Date: 07/10/2023 Prepared by: Cori Laityn Bensen  Exercises - Horizontal abd with TB with TRA breath  - 1 x daily - 7 x weekly - 3 sets - 10 reps - Sit to Stand with Pelvic Floor Contraction  - 1 x daily - 7 x weekly - 3 sets - 10 reps  Patient Education - Bowel Emptying Techniques - Lifestyle Changes to Support Urinary and Bladder Health - Urinary Urge Control Techniques  ASSESSMENT:  CLINICAL IMPRESSION: Patient has made made good progress in physical therapy and is ready for discharge. She is doing well with her exercises. . Discussed importance of consistency with HEP, and continuing at the gym to maintain strength and mobility Has not had any leaking, no constipation present. Will discharge today. Discussed return to PT as needed if status changes in the future.      OBJECTIVE IMPAIRMENTS: Abnormal gait, cardiopulmonary status  limiting activity, decreased activity tolerance, decreased balance, decreased coordination, decreased endurance, decreased mobility, difficulty walking, decreased ROM, decreased strength, and impaired tone.   ACTIVITY LIMITATIONS: continence and toileting  PARTICIPATION LIMITATIONS: community activity  PERSONAL FACTORS: Age and Time since onset of injury/illness/exacerbation are also affecting patient's functional outcome.   REHAB POTENTIAL: Good  CLINICAL DECISION MAKING: Evolving/moderate complexity  EVALUATION COMPLEXITY: Moderate   GOALS: Goals reviewed with patient? Yes  SHORT TERM GOALS: Target date: 08/07/2023    Pt will be independent with HEP.   Baseline: Goal status: progressing  2.  Pt will be independent with the knack, urge suppression technique, and double voiding in order to improve bladder habits and decrease urinary incontinence.   Baseline:  Goal status: progressing  3.  Pt will complete her bladder diary to identify bladder irritants Baseline:  Goal status: INITIAL  4.  Pt will tolerate 30 mins of exercise session without increased pain and fatigue               Baseline:  Goal status: progressing  5.  Pt will be independent with use of squatty potty, relaxed toileting mechanics, and improved bowel movement techniques in order to increase ease of bowel movements and complete evacuation.   Baseline:  Goal status: met    LONG TERM GOALS: Target date: 10/02/2023       Pt will be independent with advanced HEP.   Baseline:  Goal status: progressing  2.  Pt will stay dry when she gets up at night in order to be able to get better rest Baseline:  Goal status: met  3.  Pt will dem 5/5 MMT bilateral hips in order to decrease risk of falls Baseline:  Goal status: 4/5  4.  Pt will be able to make it to the bathroom 100% of time without leaking in order to be able to stay active in her community  Baseline:  Goal status: met  5.  Pt will report at  least 5 Bms / week to have a healthy bowel routine and reduced risk of complications  Baseline:  Goal status: met   PLAN:  PT FREQUENCY: 1-2x/week  PT DURATION: 12 weeks  PLANNED INTERVENTIONS: 97110-Therapeutic exercises, 97530- Therapeutic activity, 97112- Neuromuscular re-education, 97535- Self Care, [id number]- Manual therapy, [phone]- Electrical stimulation (manual), Patient/Family education, Balance training, Taping, Dry Needling, Joint mobilization, Joint manipulation, Spinal manipulation, Spinal mobilization, Scar mobilization, Vestibular training, Cryotherapy, Moist heat, and Biofeedback  PLAN FOR NEXT SESSION: discharge   Anastazja Isaac, PT 10/30/2023, 4:15 PM    PHYSICAL THERAPY DISCHARGE SUMMARY  Visits from Start of Care: 6   Patient agrees to discharge. Patient goals were met. Patient is being discharged due to being pleased with the current functional level.  Birdena Kingma, PT 10/30/23 4:18 PM

## 2023-11-06 ENCOUNTER — Ambulatory Visit: Admitting: Physical Therapy

## 2023-11-13 ENCOUNTER — Encounter: Admitting: Physical Therapy

## 2023-12-04 ENCOUNTER — Encounter: Admitting: Physical Therapy

## 2023-12-14 ENCOUNTER — Emergency Department (HOSPITAL_BASED_OUTPATIENT_CLINIC_OR_DEPARTMENT_OTHER)

## 2023-12-14 ENCOUNTER — Emergency Department (HOSPITAL_BASED_OUTPATIENT_CLINIC_OR_DEPARTMENT_OTHER)
Admission: EM | Admit: 2023-12-14 | Discharge: 2023-12-15 | Disposition: A | Discharge status: Home / Self Care | Attending: Emergency Medicine | Admitting: Emergency Medicine

## 2023-12-14 ENCOUNTER — Other Ambulatory Visit: Payer: Self-pay

## 2023-12-14 ENCOUNTER — Telehealth: Payer: Self-pay | Admitting: Gastroenterology

## 2023-12-14 DIAGNOSIS — R509 Fever, unspecified: Secondary | ICD-10-CM | POA: Diagnosis not present

## 2023-12-14 DIAGNOSIS — R109 Unspecified abdominal pain: Secondary | ICD-10-CM | POA: Diagnosis not present

## 2023-12-14 DIAGNOSIS — K5730 Diverticulosis of large intestine without perforation or abscess without bleeding: Secondary | ICD-10-CM | POA: Diagnosis not present

## 2023-12-14 DIAGNOSIS — R112 Nausea with vomiting, unspecified: Secondary | ICD-10-CM | POA: Insufficient documentation

## 2023-12-14 DIAGNOSIS — R1013 Epigastric pain: Secondary | ICD-10-CM | POA: Diagnosis not present

## 2023-12-14 DIAGNOSIS — R7989 Other specified abnormal findings of blood chemistry: Secondary | ICD-10-CM | POA: Insufficient documentation

## 2023-12-14 LAB — COMPREHENSIVE METABOLIC PANEL WITH GFR
ALT: 13 U/L (ref 0–44)
AST: 29 U/L (ref 15–41)
Albumin: 4.7 g/dL (ref 3.5–5.0)
Alkaline Phosphatase: 100 U/L (ref 38–126)
Anion gap: 12 (ref 5–15)
BUN: 17 mg/dL (ref 8–23)
CO2: 26 mmol/L (ref 22–32)
Calcium: 10.6 mg/dL — ABNORMAL HIGH (ref 8.9–10.3)
Chloride: 99 mmol/L (ref 98–111)
Creatinine, Ser: 0.8 mg/dL (ref 0.44–1.00)
GFR, Estimated: >60 mL/min (ref >60)
Glucose, Bld: 112 mg/dL — ABNORMAL HIGH (ref 70–99)
Potassium: 4.3 mmol/L (ref 3.5–5.1)
Sodium: 138 mmol/L (ref 135–145)
Total Bilirubin: 0.6 mg/dL (ref 0.0–1.2)
Total Protein: 9.1 g/dL — ABNORMAL HIGH (ref 6.5–8.1)

## 2023-12-14 LAB — LIPASE, BLOOD: Lipase: 61 U/L — ABNORMAL HIGH (ref 11–51)

## 2023-12-14 LAB — CBC
HCT: 46.3 % — ABNORMAL HIGH (ref 36.0–46.0)
Hemoglobin: 15.6 g/dL — ABNORMAL HIGH (ref 12.0–15.0)
MCH: 32.3 pg (ref 26.0–34.0)
MCHC: 33.7 g/dL (ref 30.0–36.0)
MCV: 95.9 fL (ref 80.0–100.0)
Platelets: 181 K/uL (ref 150–400)
RBC: 4.83 MIL/uL (ref 3.87–5.11)
RDW: 13.2 % (ref 11.5–15.5)
WBC: 10.5 K/uL (ref 4.0–10.5)
nRBC: 0 % (ref 0.0–0.2)

## 2023-12-14 MED ORDER — IOHEXOL 300 MG/ML  SOLN
80.0000 mL | Freq: Once | INTRAMUSCULAR | Status: AC | PRN
  Administered 2023-12-14: 80 mL via INTRAVENOUS

## 2023-12-14 MED ORDER — ONDANSETRON HCL 4 MG/2ML IJ SOLN: 4.0000 mg | Freq: Once | INTRAMUSCULAR | Status: DC | PRN

## 2023-12-14 NOTE — ED Triage Notes (Signed)
 Pt POV reporting fever, n/v, and abd pain, called GI doctor, concerned for colitis, admitted for same in the past.

## 2023-12-14 NOTE — Telephone Encounter (Signed)
 Received a phone call from this patient's daughter stating patient has not been feeling well all week.  Has had chills but no fever, no BM since Sunday despite taking MiraLAX , then nausea/vomiting along with fatigue earlier today.  Has had similar symptoms in the past which required hospitalization.  Discussed options with patient's daughter by phone this evening and she will plan to take her to the [facility] for expedited evaluation.  If she is not hospitalized, will have someone from our team call to check in on her tomorrow morning.

## 2023-12-15 DIAGNOSIS — R112 Nausea with vomiting, unspecified: Secondary | ICD-10-CM | POA: Diagnosis not present

## 2023-12-15 MED ORDER — ONDANSETRON 8 MG PO TBDP: ORAL_TABLET | ORAL | 0 refills | Status: AC

## 2023-12-15 MED ORDER — ONDANSETRON HCL 4 MG/2ML IJ SOLN
4.0000 mg | Freq: Once | INTRAMUSCULAR | Status: AC
  Administered 2023-12-15: 4 mg via INTRAVENOUS
  Filled 2023-12-15: qty 2

## 2023-12-15 MED ORDER — SODIUM CHLORIDE 0.9 % IV BOLUS
1000.0000 mL | Freq: Once | INTRAVENOUS | Status: AC
  Administered 2023-12-15: 1000 mL via INTRAVENOUS

## 2023-12-15 NOTE — Telephone Encounter (Signed)
 Pt was contacted for a symptom update this AM. Pt stated that she did go to the ER yesterday evening. Pt stated that she is feeling better this AM. No Nausea. Pt stated that she just drunk some Milk of Mag in hopes that she will have a BM today.  Pt was scheduled for an office visit to see Alan Coombs PA on 12/19/2023 at 8:40 AM. Pt made aware. Pt verbalized understanding with all questions answered.

## 2023-12-15 NOTE — ED Notes (Signed)
Patient was given some water.

## 2023-12-15 NOTE — ED Provider Notes (Signed)
 [place] EMERGENCY DEPARTMENT AT [facility] Provider Note   CSN: [id number] Arrival date & time: 12/14/23  2025     Patient presents with: No chief complaint on file.   [facility] Del Portal is a 78 y.o. female.   Patient is a 78 year old female with past medical history of C. difficile colitis, diverticulitis with prior hospitalizations.  Patient presenting today after an episode of nausea and vomiting that occurred this evening while attending mass.  She describes some generalized abdominal discomfort.  The patient's daughter called her GI doctor who sent her to the ER over concerns of colitis.  Patient has not had further vomiting since this episode.  She denies having had any bloody stool or vomit.  She reports feeling chilled, but was afebrile at the time.       Prior to Admission medications   Medication Sig Start Date End Date Taking? Authorizing Provider  acetaminophen  (TYLENOL ) 650 MG CR tablet Take 1,300 mg by mouth in the morning, at noon, and at bedtime. Takes 2 tablets in the morning, 2 in the afternoon, and 2 at bedtime. 03/04/19   [provider]  amoxicillin  (AMOXIL ) 500 MG tablet Take 2,000 mg by mouth See admin instructions. Take prior to dental procedures    [provider]  Cholecalciferol  (VITAMIN D ) 50 MCG (2000 UT) tablet Take 2,000 Units by mouth daily.    [provider]  COMBIGAN  0.2-0.5 % ophthalmic solution Place 1 drop into both eyes in the morning and at bedtime. 03/23/23   [provider]  dicyclomine  (BENTYL ) 10 MG capsule Take 1 capsule (10 mg total) by mouth 3 (three) times daily before meals. 09/07/23   Federico Rosario BROCKS, MD  docusate sodium  (COLACE) 100 MG capsule Take 100 mg by mouth daily.    [provider]  estradiol  (ESTRACE ) 0.1 MG/GM vaginal cream Place 0.5 g vaginally 2 (two) times a week. Place 0.5g nightly for two weeks then twice a week after Patient taking differently: Place 0.5 g  vaginally 2 (two) times a week. 12/26/22   Marilynne Rosaline SAILOR, MD  fluorouracil (EFUDEX) 5 % cream Apply 1 Application topically 2 (two) times daily. 05/30/23   [provider]  gabapentin  (NEURONTIN ) 100 MG capsule Take 2 tablets at bedtime along with 300mg  tablets. Patient taking differently: Take 100 mg by mouth at bedtime. 05/01/23   Patel, Donika K, DO  gabapentin  (NEURONTIN ) 300 MG capsule Take 1 capsule (300 mg total) by mouth 3 (three) times daily. 05/01/23   Patel, Donika K, DO  hydrocortisone (ANUSOL-HC) 2.5 % rectal cream Place 1 Application rectally as needed for hemorrhoids or anal itching.    [provider]  levothyroxine  (SYNTHROID ) 100 MCG tablet Take 100 mcg by mouth daily before breakfast.    [provider]  lipase/protease/amylase (CREON ) 36000 UNITS CPEP capsule Take 2 capsules (72,000 Units total) by mouth 3 (three) times daily with meals. 07/08/22   Aneita Gwendlyn DASEN, MD  Menthol , Topical Analgesic, (BIOFREEZE EX) Apply 1 drop topically daily as needed (back pain).    [provider]  Multiple Vitamins-Minerals (CENTRUM SILVER 50+WOMEN PO) Take 1 tablet by mouth daily.    [provider]  olmesartan  (BENICAR ) 20 MG tablet Take 1 tablet (20 mg total) by mouth daily. 01/09/23     ondansetron  (ZOFRAN ) 4 MG tablet Take 1 tablet (4 mg total) by mouth every 8 (eight) hours as needed for nausea or vomiting. 11/09/22   Zuleta, Kaitlin G, NP  polyethylene glycol (MIRALAX  / GLYCOLAX ) 17 g packet Take 17 g by mouth daily. Patient taking differently: Take 17 g by mouth daily as needed (constipation). 04/09/23   Drusilla Sabas RAMAN, MD  Propylene Glycol (SYSTANE COMPLETE OP) Place 1 drop into both eyes every 6 (six) hours as needed (dry eyes).    [provider]  rosuvastatin  (CRESTOR ) 10 MG tablet Take 10 mg by mouth at bedtime. 04/15/20   [provider]  saccharomyces boulardii (FLORASTOR) 250 MG capsule Take 1 capsule (250 mg total) by  mouth 2 (two) times daily. Patient taking differently: Take 500 mg by mouth daily. 04/08/23   Drusilla Sabas RAMAN, MD  traMADol  (ULTRAM ) 50 MG tablet Take 1 tablet (50 mg total) by mouth every 6 (six) hours as needed. 06/13/23 06/12/24  Rubin Calamity, MD  Vibegron  75 MG TABS Take 1 tablet (75 mg total) by mouth daily. Patient taking differently: Take 75 mg by mouth at bedtime. 04/26/23   Marilynne Rosaline SAILOR, MD    Allergies: Cosopt  [dorzolamide  hcl-timolol  mal], Morphine, Other, and Oyster shell    Review of Systems  All other systems reviewed and are negative.   Updated Vital Signs BP 121/75   Pulse 91   Temp 98.3 F (36.8 C) (Oral)   Resp 19   Ht 5' 4 (1.626 m)   Wt 75.3 kg   SpO2 96%   BMI 28.49 kg/m   Physical Exam Vitals and nursing note reviewed.  Constitutional:      General: She is not in acute distress.    Appearance: She is well-developed. She is not diaphoretic.  HENT:     Head: Normocephalic and atraumatic.  Cardiovascular:     Rate and Rhythm: Normal rate and regular rhythm.     Heart sounds: No murmur heard.    No friction rub. No gallop.  Pulmonary:     Effort: Pulmonary effort is normal. No respiratory distress.     Breath sounds: Normal breath sounds. No wheezing.  Abdominal:     General: Bowel sounds are normal. There is no distension.     Palpations: Abdomen is soft.     Tenderness: There is no abdominal tenderness.  Musculoskeletal:        General: Normal range of motion.     Cervical back: Normal range of motion and neck supple.  Skin:    General: Skin is warm and dry.  Neurological:     General: No focal deficit present.     Mental Status: She is alert and oriented to person, place, and time.     (all labs ordered are listed, but only abnormal results are displayed) Labs Reviewed  LIPASE, BLOOD - Abnormal; Notable for the following components:      Result Value   Lipase 61 (*)    All other components within normal limits  COMPREHENSIVE  METABOLIC PANEL WITH GFR - Abnormal; Notable for the following components:   Glucose, Bld 112 (*)    Calcium  10.6 (*)    Total Protein 9.1 (*)    All other components within normal limits  CBC - Abnormal; Notable for the following components:   Hemoglobin 15.6 (*)    HCT 46.3 (*)    All other components within normal limits  URINALYSIS, ROUTINE W REFLEX MICROSCOPIC    EKG: None  Radiology: CT ABDOMEN PELVIS W CONTRAST Result Date: 12/14/2023 CLINICAL DATA:  Abdominal pain, fever, nausea, vomiting EXAM: CT ABDOMEN AND PELVIS WITH CONTRAST TECHNIQUE: Multidetector CT imaging of  the abdomen and pelvis was performed using the standard protocol following bolus administration of intravenous contrast. RADIATION DOSE REDUCTION: This exam was performed according to the departmental dose-optimization program which includes automated exposure control, adjustment of the mA and/or kV according to patient size and/or use of iterative reconstruction technique. CONTRAST:  80mL OMNIPAQUE  IOHEXOL  300 MG/ML  SOLN COMPARISON:  04/05/2023 FINDINGS: Lower chest: No acute findings Hepatobiliary: Prior cholecystectomy. Mild intrahepatic and extrahepatic biliary ductal dilatation stable since prior study most compatible with post cholecystectomy state. Pancreas: No focal abnormality or ductal dilatation. Spleen: Normal size.  Scattered calcifications. Adrenals/Urinary Tract: No adrenal abnormality. No focal renal abnormality. No stones or hydronephrosis. Urinary bladder is unremarkable. Stomach/Bowel: Sigmoid diverticulosis. No active diverticulitis. Stomach and small bowel decompressed, unremarkable. Moderate stool burden. No obstruction or inflammatory process. Vascular/Lymphatic: Aortic atherosclerosis. No evidence of aneurysm or adenopathy. Reproductive: Prior hysterectomy.  No adnexal masses. Other: No free fluid or free air. Musculoskeletal: No acute bony abnormality. IMPRESSION: No acute findings in the abdomen or  pelvis. Sigmoid diverticulosis.  Moderate stool burden throughout the colon. Aortic atherosclerosis. Electronically Signed   By: Franky Crease M.D.   On: 12/14/2023 22:00     Procedures   Medications Ordered in the ED  ondansetron  (ZOFRAN ) injection 4 mg (has no administration in time range)  sodium chloride  0.9 % bolus 1,000 mL (has no administration in time range)  ondansetron  (ZOFRAN ) injection 4 mg (has no administration in time range)  iohexol  (OMNIPAQUE ) 300 MG/ML solution 80 mL (80 mLs Intravenous Contrast Given 12/14/23 2150)                                    Medical Decision Making Amount and/or Complexity of Data Reviewed Labs: ordered.  Risk Prescription drug management.   Patient is a 78 year old female presenting with an episode of nausea and vomiting as described in the HPI.  Patient arrives here with stable vital signs and is afebrile.  Physical examination is unremarkable and abdomen is benign.  Laboratory studies obtained including CBC, CMP, and lipase.  All are basically unremarkable with the exception of a mildly elevated lipase of 61.  I suspect this is related to vomiting.  CT scan of the abdomen and pelvis shows no acute process.  Just a moderate stool burden throughout the colon.  Patient hydrated with normal saline and given Zofran  for nausea.  She seems to be feeling markedly improved and is tolerating p.o. without difficulty.  Daughter at bedside concerned about her history of admissions for colitis, but nothing suggests that today.  Will hold off on antibiotics as I suspect this could make the situation worse.  She has had some constipation and will take milk of magnesia at home that has worked for her in the past.     Final diagnoses:  None    ED Discharge Orders     None          Geroldine Berg, MD 12/15/23 [id number]

## 2023-12-15 NOTE — Discharge Instructions (Signed)
 Begin taking Zofran  as prescribed as needed for nausea.  Clear liquids for the next 12 hours, then slowly advance diet as tolerated.  Return to the emergency department if you develop worsening vomiting, severe abdominal pain, high fever, bloody stools, or for other new and concerning symptoms.

## 2023-12-18 NOTE — Progress Notes (Unsigned)
 12/19/2023 Tracy Hunt [id number] 1946/01/31  Referring provider: Janey Santos, MD Primary GI doctor: Dr. Federico  ASSESSMENT AND PLAN:   Nausea, vomiting generalized abdominal pain history of colitis/rectal ulcer, found to have constipation on recent ER visit January 2025 CT showed colon wall thickening with stranding in sigmoid colon and rectum, C. difficile and GI pathogen negative, presumed infectious versus ischemic 04/08/2023 flexible sigmoidoscopy found to have solitary sterile coral or rectal ulcer Started on MiraLAX  and referred to pelvic floor physical therapy 12/14/2023 CTAP W no acute process sigmoid diverticulosis with moderate stool burden throughout colon labs CBC with Hgb slightly elevated 15.6, calcium  and total protein elevated at 10.6 and 9.1, lipase 61 likely secondary to vomiting Went to pelvic floor PT, has not been doing exercises Took MOM for 2 days, on miralax  once daily since Feb Chronic constipation with pelvic floor dysfunction, decreased rectal tone, incomplete bowel movements, and pelvic floor laxity. Recent ER visit for nausea and vomiting likely due to constipation. Persistent symptoms despite daily Miralax . - Continue Miralax  daily. - Add low dose Benefiber. - Prescribe Flagyl  for 10 days, three times a day with food. - Consider trial of Linzess 72 if symptoms persist. - Encourage pelvic floor exercises.  Rectal ulcer Rectal ulcer noted on flexible sigmoidoscopy in January, likely related to stool burden and mucosal pressure. No active inflammation or bleeding. - Check inflammatory markers.  Diverticulosis of colon Diverticulosis noted on recent CT scan without diverticulitis. No symptoms of diverticulitis. Management focused on constipation to prevent complications  Pancreatic insufficiency On Creon  for pancreatic enzyme replacement. -Continue Creon  with meals. -Provide patient education on the use of Creon  with meals and  snacks.  Personal history of colon polyps Last colonoscopy in 05/2019 showed 1 small polyp in the transverse colon that was removed and many large diverticuli in the sigmoid and descending colon.   Patient Care Team: Avva, Ravisankar, MD as PCP - General (Internal Medicine) Patel, Donika K, DO as Consulting Physician (Neurology)  HISTORY OF PRESENT ILLNESS: 78 y.o. female with a past medical history of pancreatic insufficiency IBS diverticulitis prior C. difficile, family history of colon cancer listed below presents for evaluation of AB pain.   Discussed the use of AI scribe software for clinical note transcription with the patient, who gave verbal consent to proceed.  History of Present Illness   Tracy Hunt is a 78 year old female with diverticulosis who presents with constipation and gastrointestinal symptoms.  She recently visited the emergency room due to nausea and vomiting. A CT scan performed five days ago revealed diverticulosis without diverticulitis and a moderate stool burden. Her lipase levels were slightly elevated. She took milk of magnesia two days after the ER visit, resulting in the passage of small, formed stools intermittently. She had not realized she was constipated until she became symptomatic, as she had been passing only mucus for several days.  She has been on Miralax  daily since January and maintains a consistent diet aimed at promoting bowel movements. She has a history of vasovagal responses related to bowel issues. During the recent episode, she experienced symptoms similar to those preceding past hospitalizations, including nausea and vomiting.  No significant changes in her routine, diet, or medications, except for a recent cold about ten days prior to the ER visit. She denies taking any new over-the-counter medications or supplements. She occasionally experiences heartburn and difficulty swallowing when eating quickly, but denies regular nausea or  trouble swallowing. She is  generally active and independent.  She experienced a gout flare-up, which was unusual as there were no dietary changes. She took low-dose prednisone  for two days, which resolved the gout symptoms. During a church visit, she felt unwell, vomited, and subsequently experienced worsening symptoms, leading to the ER visit. No fever, chills, blood in stool, or significant rectal pain, though she notes occasional rectal irritation and increased gas.  She has a history of joint replacements and previously took amoxicillin  prophylactically for dental procedures, but was advised to discontinue this practice due to a past C. diff infection.      She  reports that she has never smoked. She has never used smokeless tobacco. She reports that she does not currently use alcohol . She reports that she does not use drugs.  RELEVANT GI HISTORY, IMAGING AND LABS: Results   RADIOLOGY Abdominal CT: Diverticulosis, moderate stool burden, no diverticulitis (12/14/2023)  DIAGNOSTIC Flexible sigmoidoscopy: Rectal ulcer (January 2025)     2021 Colon with Dr. Kristie 3 mm polyp and diverticulosis 05/19/2022 office visit with Dr. Aneita for follow-up of C. difficile colitis initially treated with vancomycin  but had ongoing fever treated with 10-day course of Dificid .  Given trial of dicyclomine . CT A/P w/contrast 04/05/23: IMPRESSION: 1. Moderate abnormal colon wall thickening in the descending colon with striking abnormal wall thickening in the sigmoid colon and rectum, with substantial perirectal stranding and edema. This pattern of wall thickening has been present on some prior exams, most notably the CT scan 03/31/2022. Although there is some atherosclerosis including plaque at the origin of the inferior mesenteric artery, the IMA is not overtly occluded or narrowed beyond this area of plaque, in the inferior mesenteric vein appears patent. It is conceivable that a hypotensive episode might  reduce flow in the IMA enough to cause a transient ischemia that could lead to this distribution of inflammation, although this is speculative and there are no specific indicators of ischemia such as pneumatosis or Hunt venous gas. Other possibilities include recurrent infectious colitis; inflammatory bowel disease; or pseudomembranous colitis if the patient has been on recent antibiotic therapy. 2. Inflammatory findings in the perirectal region are associated with substantial presacral edema and potentially a trace amount of pelvic ascites. 3. Moderate sigmoid colon diverticulosis without focal indicator of acute diverticulitis. 4. Stable moderate cardiomegaly. 5. Small type 1 hiatal hernia. 6. Chronic right upper quadrant anterior abdominal wall hernia contains adipose tissue. 7. Improvement in prior pelvic floor laxity status post levator plication in September 2024. 8. S-shaped thoracolumbar scoliosis with associated grade 1 anterolisthesis at L4-5. Spondylosis and degenerative disc disease result in right foraminal impingement at L1-2, L2-3, and L5-S1; and left foraminal impingement at L3-4, L4-5, and especially L5-S1. 9. Aortic atherosclerosis.   Flex sig 04/08/23: - Diverticulosis in the sigmoid colon and in the descending colon. - Localized inflammation was found in the rectum, in the sigmoid colon and in the descending colon. Biopsied. - Non- bleeding internal hemorrhoids. - A single ( solitary) ulcer in the rectum. Biopsied. - Non- bleeding internal hemorrhoids. Path: A. COLON, BIOPSY:      Active colitis with marked reactive epithelial changes.      Negative for chronicity, granuloma, dysplasia or malignancy.      See comment. B. RECTUM, ULCER, BIOPSY:      Active colitis with marked reactive epithelial changes.      Negative for chronicity, granuloma, dysplasia or malignancy.      See comment. COMMENT: The biopsies are composed of fragments of colonic  mucosa with  cryptitis with reactive epithelial changes. There is no evidence of chronicity including crypt architectural distortion, basilar lymphoplasmacytosis or metaplasia. The etiology considerations include infection, medication effect, diverticular disease associated colitis, ischemic colitis, et al. The possibility of inflammatory bowel disease is relatively low but cannot be entirely excluded. Clinical correlation is recommended.    CBC    Component Value Date/Time   WBC 10.5 12/14/2023 2043   RBC 4.83 12/14/2023 2043   HGB 15.6 (H) 12/14/2023 2043   HCT 46.3 (H) 12/14/2023 2043   PLT 181 12/14/2023 2043   MCV 95.9 12/14/2023 2043   MCH 32.3 12/14/2023 2043   MCHC 33.7 12/14/2023 2043   RDW 13.2 12/14/2023 2043   LYMPHSABS 1.3 04/05/2023 1232   MONOABS 0.6 04/05/2023 1232   EOSABS 0.1 04/05/2023 1232   BASOSABS 0.0 04/05/2023 1232   Recent Labs    02/14/23 1210 04/05/23 1232 04/06/23 0305 04/08/23 0706 06/05/23 1328 12/14/23 2043  HGB 13.1 13.1 14.2 12.0 13.1 15.6*    CMP     Component Value Date/Time   NA 138 12/14/2023 2043   K 4.3 12/14/2023 2043   CL 99 12/14/2023 2043   CO2 26 12/14/2023 2043   GLUCOSE 112 (H) 12/14/2023 2043   BUN 17 12/14/2023 2043   CREATININE 0.80 12/14/2023 2043   CALCIUM  10.6 (H) 12/14/2023 2043   PROT 9.1 (H) 12/14/2023 2043   ALBUMIN 4.7 12/14/2023 2043   AST 29 12/14/2023 2043   ALT 13 12/14/2023 2043   ALKPHOS 100 12/14/2023 2043   BILITOT 0.6 12/14/2023 2043   GFRNONAA >60 12/14/2023 2043      Latest Ref Rng & Units 12/14/2023    8:43 PM 06/05/2023    1:28 PM 04/08/2023    7:06 AM  Hepatic Function  Total Protein 6.5 - 8.1 g/dL 9.1  7.6  6.6   Albumin 3.5 - 5.0 g/dL 4.7  3.6  3.0   AST 15 - 41 U/L 29  21  22    ALT 0 - 44 U/L 13  14  13    Alk Phosphatase 38 - 126 U/L 100  69  55   Total Bilirubin 0.0 - 1.2 mg/dL 0.6  0.6  0.9       Current Medications:   Current Outpatient Medications (Endocrine & Metabolic):     levothyroxine  (SYNTHROID ) 100 MCG tablet, Take 100 mcg by mouth daily before breakfast.  Current Outpatient Medications (Cardiovascular):    olmesartan  (BENICAR ) 20 MG tablet, Take 1 tablet (20 mg total) by mouth daily.   rosuvastatin  (CRESTOR ) 10 MG tablet, Take 10 mg by mouth at bedtime.   Current Outpatient Medications (Analgesics):    acetaminophen  (TYLENOL ) 650 MG CR tablet, Take 1,300 mg by mouth in the morning, at noon, and at bedtime. Takes 2 tablets in the morning, 2 in the afternoon, and 2 at bedtime.   traMADol  (ULTRAM ) 50 MG tablet, Take 1 tablet (50 mg total) by mouth every 6 (six) hours as needed.   Current Outpatient Medications (Other):    amoxicillin  (AMOXIL ) 500 MG tablet, Take 2,000 mg by mouth See admin instructions. Take prior to dental procedures   Cholecalciferol  (VITAMIN D ) 50 MCG (2000 UT) tablet, Take 2,000 Units by mouth daily.   COMBIGAN  0.2-0.5 % ophthalmic solution, Place 1 drop into both eyes in the morning and at bedtime.   dicyclomine  (BENTYL ) 10 MG capsule, Take 1 capsule (10 mg total) by mouth 3 (three) times daily before meals.   docusate sodium  (  COLACE) 100 MG capsule, Take 100 mg by mouth daily.   estradiol  (ESTRACE ) 0.1 MG/GM vaginal cream, Place 0.5 g vaginally 2 (two) times a week. Place 0.5g nightly for two weeks then twice a week after (Patient taking differently: Place 0.5 g vaginally 2 (two) times a week.)   gabapentin  (NEURONTIN ) 100 MG capsule, Take 2 tablets at bedtime along with 300mg  tablets. (Patient taking differently: Take 100 mg by mouth at bedtime.)   gabapentin  (NEURONTIN ) 300 MG capsule, Take 1 capsule (300 mg total) by mouth 3 (three) times daily.   hydrocortisone (ANUSOL-HC) 2.5 % rectal cream, Place 1 Application rectally as needed for hemorrhoids or anal itching.   lipase/protease/amylase (CREON ) 36000 UNITS CPEP capsule, Take 2 capsules (72,000 Units total) by mouth 3 (three) times daily with meals.   Menthol , Topical Analgesic,  (BIOFREEZE EX), Apply 1 drop topically daily as needed (back pain).   metroNIDAZOLE  (FLAGYL ) 250 MG tablet, Take 1 tablet (250 mg total) by mouth 3 (three) times daily for 10 days.   Multiple Vitamins-Minerals (CENTRUM SILVER 50+WOMEN PO), Take 1 tablet by mouth daily.   ondansetron  (ZOFRAN ) 4 MG tablet, Take 1 tablet (4 mg total) by mouth every 8 (eight) hours as needed for nausea or vomiting.   ondansetron  (ZOFRAN -ODT) 8 MG disintegrating tablet, 8mg  ODT q4 hours prn nausea   polyethylene glycol (MIRALAX  / GLYCOLAX ) 17 g packet, Take 17 g by mouth daily. (Patient taking differently: Take 17 g by mouth daily as needed (constipation).)   Propylene Glycol (SYSTANE COMPLETE OP), Place 1 drop into both eyes every 6 (six) hours as needed (dry eyes).   saccharomyces boulardii (FLORASTOR) 250 MG capsule, Take 1 capsule (250 mg total) by mouth 2 (two) times daily. (Patient taking differently: Take 500 mg by mouth daily.)   Vibegron  75 MG TABS, Take 1 tablet (75 mg total) by mouth daily. (Patient taking differently: Take 75 mg by mouth at bedtime.)   fluorouracil (EFUDEX) 5 % cream, Apply 1 Application topically 2 (two) times daily. (Patient not taking: Reported on 12/19/2023)  Medical History:  Past Medical History:  Diagnosis Date   Acid reflux    Anemia    Arthritis    Bell's palsy 1988   C. difficile diarrhea 03/2022   Complication of anesthesia    Hard to wake up after colonoscopy around 2000, but no issues with surgeries/colonoscopies after that   Diverticulitis    Dry eye    Family history of adverse reaction to anesthesia    mom hard to wake up and had lethargy after anesthesia   Glaucoma    Gout    Hepatitis 1986   secondary to varicella was in hospital for 2 days   Hiatal hernia    Hyperlipemia    Hypertension    Hypothyroidism    Malaria    as as child   Mumps    Pancreatic insufficiency    Per patient and her daughter   Pre-diabetes    Rosacea    Scoliosis    Sjogren's  syndrome    Allergies:  Allergies  Allergen Reactions   Cosopt  [Dorzolamide  Hcl-Timolol  Mal] Other (See Comments)    Very irritated   Morphine     Makes me hyper   Other Itching    Adhesive tape  Paper tape is okay   Oyster Shell Hives, Diarrhea and Nausea And Vomiting    Oysters - cramping     Surgical History:  She  has a past surgical history that  includes Abdominal hysterectomy (1995); Appendectomy (1995); Cholecystectomy (01/16/2012); steroid injections; Reverse shoulder arthroplasty (Left, 10/23/2020); Cataract extraction (Bilateral); Colpocleisis (N/A, 12/05/2022); Rectocele repair (N/A, 12/05/2022); Cystoscopy (N/A, 12/05/2022); Flexible sigmoidoscopy (N/A, 04/08/2023); biopsy (04/08/2023); Knee Arthroplasty (Left, 12/27/2010); Knee Arthroplasty (08/10/2015); Lumbar Radio Frequency Ablation (11/27/2012); Lumbar Radio Requency Ablation (02/05/2014); and XI robotic assisted ventral hernia (N/A, 06/13/2023). Family History:  Her family history includes Breast cancer in her paternal grandmother; CVA in her sister; Colon cancer in her mother; Dementia in her sister; Diabetes in her father; Hypertension in her mother; Lung cancer in her mother.  REVIEW OF SYSTEMS  : All other systems reviewed and negative except where noted in the History of Present Illness.  PHYSICAL EXAM: BP 130/68   Pulse 70   Ht 5' 4 (1.626 m)   Wt 168 lb (76.2 kg)   BMI 28.84 kg/m  Physical Exam   GENERAL APPEARANCE: Well nourished, in no apparent distress. HEENT: No cervical lymphadenopathy, unremarkable thyroid , sclerae anicteric, conjunctiva pink. RESPIRATORY: Respiratory effort normal, breath sounds equal bilaterally without rales, rhonchi, or wheezing. CARDIO: Regular rate and rhythm with no murmurs, rubs, or gallops, peripheral pulses intact. ABDOMEN: Soft, non-distended, active bowel sounds in all four quadrants, no tenderness to palpation, no rebound, no mass appreciated. RECTAL: Hemorrhoidal skin  tags present, benign. Decreased rectal tone. No hard stool or mass palpable. Soft stool present. MUSCULOSKELETAL: Full range of motion, normal gait, without edema. SKIN: Dry, intact without rashes or lesions. No jaundice. NEURO: Alert, oriented, no focal deficits. PSYCH: Cooperative, normal mood and affect.      Tracy JONELLE Coombs, PA-C 10:37 AM

## 2023-12-19 ENCOUNTER — Encounter: Payer: Self-pay | Admitting: Physician Assistant

## 2023-12-19 ENCOUNTER — Other Ambulatory Visit (INDEPENDENT_AMBULATORY_CARE_PROVIDER_SITE_OTHER)

## 2023-12-19 ENCOUNTER — Ambulatory Visit (INDEPENDENT_AMBULATORY_CARE_PROVIDER_SITE_OTHER): Admitting: Physician Assistant

## 2023-12-19 ENCOUNTER — Ambulatory Visit: Payer: Self-pay | Admitting: Physician Assistant

## 2023-12-19 VITALS — BP 130/68 | HR 70 | Ht 64.0 in | Wt 168.0 lb

## 2023-12-19 DIAGNOSIS — Z8719 Personal history of other diseases of the digestive system: Secondary | ICD-10-CM

## 2023-12-19 DIAGNOSIS — K5900 Constipation, unspecified: Secondary | ICD-10-CM | POA: Diagnosis not present

## 2023-12-19 DIAGNOSIS — Z860100 Personal history of colon polyps, unspecified: Secondary | ICD-10-CM

## 2023-12-19 DIAGNOSIS — K626 Ulcer of anus and rectum: Secondary | ICD-10-CM

## 2023-12-19 DIAGNOSIS — K580 Irritable bowel syndrome with diarrhea: Secondary | ICD-10-CM | POA: Diagnosis not present

## 2023-12-19 DIAGNOSIS — K869 Disease of pancreas, unspecified: Secondary | ICD-10-CM | POA: Diagnosis not present

## 2023-12-19 DIAGNOSIS — A0472 Enterocolitis due to Clostridium difficile, not specified as recurrent: Secondary | ICD-10-CM

## 2023-12-19 DIAGNOSIS — K8689 Other specified diseases of pancreas: Secondary | ICD-10-CM

## 2023-12-19 DIAGNOSIS — R1084 Generalized abdominal pain: Secondary | ICD-10-CM

## 2023-12-19 DIAGNOSIS — R112 Nausea with vomiting, unspecified: Secondary | ICD-10-CM | POA: Diagnosis not present

## 2023-12-19 LAB — CBC WITH DIFFERENTIAL/PLATELET
Basophils Absolute: 0 K/uL (ref 0.0–0.1)
Basophils Relative: 0.3 % (ref 0.0–3.0)
Eosinophils Absolute: 0.2 K/uL (ref 0.0–0.7)
Eosinophils Relative: 6.7 % — ABNORMAL HIGH (ref 0.0–5.0)
HCT: 39.3 % (ref 36.0–46.0)
Hemoglobin: 13 g/dL (ref 12.0–15.0)
Lymphocytes Relative: 35.9 % (ref 12.0–46.0)
Lymphs Abs: 1.3 K/uL (ref 0.7–4.0)
MCHC: 33.2 g/dL (ref 30.0–36.0)
MCV: 95 fl (ref 78.0–100.0)
Monocytes Absolute: 0.4 K/uL (ref 0.1–1.0)
Monocytes Relative: 10.6 % (ref 3.0–12.0)
Neutro Abs: 1.7 K/uL (ref 1.4–7.7)
Neutrophils Relative %: 46.5 % (ref 43.0–77.0)
Platelets: 159 K/uL (ref 150.0–400.0)
RBC: 4.13 Mil/uL (ref 3.87–5.11)
RDW: 13.5 % (ref 11.5–15.5)
WBC: 3.7 K/uL — ABNORMAL LOW (ref 4.0–10.5)

## 2023-12-19 LAB — COMPREHENSIVE METABOLIC PANEL WITH GFR
ALT: 11 U/L (ref 0–35)
AST: 17 U/L (ref 0–37)
Albumin: 4.1 g/dL (ref 3.5–5.2)
Alkaline Phosphatase: 73 U/L (ref 39–117)
BUN: 13 mg/dL (ref 6–23)
CO2: 32 meq/L (ref 19–32)
Calcium: 10.4 mg/dL (ref 8.4–10.5)
Chloride: 102 meq/L (ref 96–112)
Creatinine, Ser: 0.81 mg/dL (ref 0.40–1.20)
GFR: 69.63 mL/min (ref >60.00)
Glucose, Bld: 103 mg/dL — ABNORMAL HIGH (ref 70–99)
Potassium: 4.6 meq/L (ref 3.5–5.1)
Sodium: 140 meq/L (ref 135–145)
Total Bilirubin: 0.5 mg/dL (ref 0.2–1.2)
Total Protein: 7.9 g/dL (ref 6.0–8.3)

## 2023-12-19 LAB — SEDIMENTATION RATE: Sed Rate: 51 mm/h — ABNORMAL HIGH (ref 0–30)

## 2023-12-19 MED ORDER — METRONIDAZOLE 250 MG PO TABS: 250.0000 mg | ORAL_TABLET | Freq: Three times a day (TID) | ORAL | 0 refills | Status: AC

## 2023-12-19 NOTE — Patient Instructions (Addendum)
 _______________________________________________________  If your blood pressure at your visit was 140/90 or greater, please contact your primary care physician to follow up on this.  _______________________________________________________  If you are age 78 or older, your body mass index should be between 23-30. Your Body mass index is 28.84 kg/m. If this is out of the aforementioned range listed, please consider follow up with your Primary Care Provider.  If you are age 78 or younger, your body mass index should be between 19-25. Your Body mass index is 28.84 kg/m. If this is out of the aformentioned range listed, please consider follow up with your Primary Care Provider.   ________________________________________________________  The [facility] providers would like to encourage you to use MYCHART to communicate with providers for non-urgent requests or questions.  Due to long hold times on the telephone, sending your provider a message by [facility] may be a faster and more efficient way to get a response.  Please allow 48 business hours for a response.  Please remember that this is for non-urgent requests.  _______________________________________________________  [facility] is using a team-based approach to care.  Your team is made up of your doctor and two to three APPS. Our APPS (Nurse Practitioners and Physician Assistants) work with your physician to ensure care continuity for you. They are fully qualified to address your health concerns and develop a treatment plan. They communicate directly with your gastroenterologist to care for you. Seeing the Advanced Practice Practitioners on your physician's team can help you by facilitating care more promptly, often allowing for earlier appointments, access to diagnostic testing, procedures, and other specialty referrals.   Your provider has requested that you go to the basement level for lab work before leaving today. Press B on the  elevator. The lab is located at the first door on the left as you exit the elevator.  Due to recent changes in healthcare laws, you may see the results of your imaging and laboratory studies on MyChart before your provider has had a chance to review them.  We understand that in some cases there may be results that are confusing or concerning to you. Not all laboratory results come back in the same time frame and the provider may be waiting for multiple results in order to interpret others.  Please give us  48 hours in order for your provider to thoroughly review all the results before contacting the office for clarification of your results.   Try the flagyl  first to see if this helps Continue fiber and miralax   If this does not help try the linzess 72 mcg Linzess  *IBS-C patients may begin to experience relief from belly pain and overall abdominal symptoms (pain, discomfort, and bloating) in about 1 week,  with symptoms typically improving over 12 weeks.  Take at least 30 minutes before the first meal of the day on an empty stomach You can have a loose stool if you eat a high-fat breakfast. Give it at least 7 days, may have more bowel movements during that time.   The diarrhea should go away and you should start having normal, complete, full bowel movements.  It may be helpful to start treatment when you can be near the comfort of your own bathroom, such as a weekend.  After you are out we can send in a prescription if you did well, there is a prescription card  FIBER SUPPLEMENT You can do metamucil or fibercon once or twice a day but if this causes gas/bloating please switch  to Benefiber or Citracel.  Fiber is good for constipation/diarrhea/irritable bowel syndrome.  It can also help with weight loss and can help lower your bad cholesterol (LDL).  Please do 1 TBSP in the morning in water , coffee, or tea.  It can take up to a month before you can see a difference with your bowel movements.   It is cheapest from [employer], [employer], [employer].   Toileting tips to help with your constipation - Drink at least 64-80 ounces of water /liquid per day. - Establish a time to try to move your bowels every day.  For many people, this is after a cup of coffee or after a meal such as breakfast. - Sit all of the way back on the toilet keeping your back fairly straight and while sitting up, try to rest the tops of your forearms on your upper thighs.   - Raising your feet with a step stool/squatty potty can be helpful to improve the angle that allows your stool to pass through the rectum. - Relax the rectum feeling it bulge toward the toilet water .  If you feel your rectum raising toward your body, you are contracting rather than relaxing. - Breathe in and slowly exhale. Belly breath by expanding your belly towards your belly button. Keep belly expanded as you gently direct pressure down and back to the anus.  A low pitched GRRR sound can assist with increasing intra-abdominal pressure.  (Can also trying to blow on a pinwheel and make it move, this helps with the same belly breathing) - Repeat 3-4 times. If unsuccessful, contract the pelvic floor to restore normal tone and get off the toilet.  Avoid excessive straining. - To reduce excessive wiping by teaching your anus to normally contract, place hands on outer aspect of knees and resist knee movement outward.  Hold 5-10 second then place hands just inside of knees and resist inward movement of knees.  Hold 5 seconds.  Repeat a few times each way.  Go to the ER if unable to pass gas, severe AB pain, unable to hold down food, any shortness of breath of chest pain.   Small intestinal bacterial overgrowth (SIBO) occurs when there is an abnormal increase in the overall bacterial population in the small intestine -- particularly types of bacteria not commonly found in that part of the digestive tract. Small intestinal bacterial overgrowth (SIBO) commonly  results when a circumstance -- such as surgery or disease -- slows the passage of food and waste products in the digestive tract, creating a breeding ground for bacteria.  Signs and symptoms of SIBO often include: Loss of appetite Abdominal pain Nausea Bloating An uncomfortable feeling of fullness after eating Diarrhea or constipation, depending on the type of gas produced  What foods trigger SIBO? While foods aren't the original cause of SIBO, certain foods do encourage the overgrowth of the wrong bacteria in your small intestine. If you're feeding them their favorite foods, they're going to grow more, and that will trigger more of your SIBO symptoms. By the same token, you can help reduce the overgrowth by starving the problematic bacteria of their favorite foods. This strategy has led to a number of proposed SIBO eating plans. The plans vary, and so do individual results. But in general, they tend to recommend limiting carbohydrates.  These include: Sugars and sweeteners. Fruits and starchy vegetables. Dairy products. Grains.  There is a test for this we can do called a breath test, if you are positive we will treat  you with an antibiotic to see if it helps.  Your symptoms are very suspicious for this condition, as discussed, we will start you on an antibiotic to see if this helps.     Small intestinal bacterial overgrowth (SIBO) occurs when there is an abnormal increase in the overall bacterial population in the small intestine -- particularly types of bacteria not commonly found in that part of the digestive tract. Small intestinal bacterial overgrowth (SIBO) commonly results when a circumstance -- such as surgery or disease -- slows the passage of food and waste products in the digestive tract, creating a breeding ground for bacteria.  Signs and symptoms of SIBO often include: Loss of appetite Abdominal pain Nausea Bloating An uncomfortable feeling of fullness after  eating Diarrhea or constipation, depending on the type of gas produced  What foods trigger SIBO? While foods aren't the original cause of SIBO, certain foods do encourage the overgrowth of the wrong bacteria in your small intestine. If you're feeding them their favorite foods, they're going to grow more, and that will trigger more of your SIBO symptoms. By the same token, you can help reduce the overgrowth by starving the problematic bacteria of their favorite foods. This strategy has led to a number of proposed SIBO eating plans. The plans vary, and so do individual results. But in general, they tend to recommend limiting carbohydrates.  These include: Sugars and sweeteners. Fruits and starchy vegetables. Dairy products. Grains.  There is a test for this we can do called a breath test, if you are positive we will treat you with an antibiotic to see if it helps.  Your symptoms are very suspicious for this condition, as discussed, we will start you on an antibiotic to see if this helps.   Thank you for entrusting me with your care and choosing [facility].  Alan Coombs, PA-C

## 2023-12-28 DIAGNOSIS — H04123 Dry eye syndrome of bilateral lacrimal glands: Secondary | ICD-10-CM | POA: Diagnosis not present

## 2023-12-28 DIAGNOSIS — H401131 Primary open-angle glaucoma, bilateral, mild stage: Secondary | ICD-10-CM | POA: Diagnosis not present

## 2023-12-31 ENCOUNTER — Other Ambulatory Visit: Payer: Self-pay | Admitting: Internal Medicine

## 2024-01-01 DIAGNOSIS — E663 Overweight: Secondary | ICD-10-CM | POA: Diagnosis not present

## 2024-01-01 DIAGNOSIS — M1A09X0 Idiopathic chronic gout, multiple sites, without tophus (tophi): Secondary | ICD-10-CM | POA: Diagnosis not present

## 2024-01-01 DIAGNOSIS — Z6829 Body mass index (BMI) 29.0-29.9, adult: Secondary | ICD-10-CM | POA: Diagnosis not present

## 2024-01-01 DIAGNOSIS — G5793 Unspecified mononeuropathy of bilateral lower limbs: Secondary | ICD-10-CM | POA: Diagnosis not present

## 2024-01-01 DIAGNOSIS — R251 Tremor, unspecified: Secondary | ICD-10-CM | POA: Diagnosis not present

## 2024-01-01 DIAGNOSIS — M79645 Pain in left finger(s): Secondary | ICD-10-CM | POA: Diagnosis not present

## 2024-01-01 DIAGNOSIS — M359 Systemic involvement of connective tissue, unspecified: Secondary | ICD-10-CM | POA: Diagnosis not present

## 2024-01-01 DIAGNOSIS — M1991 Primary osteoarthritis, unspecified site: Secondary | ICD-10-CM | POA: Diagnosis not present

## 2024-01-08 DIAGNOSIS — E785 Hyperlipidemia, unspecified: Secondary | ICD-10-CM | POA: Diagnosis not present

## 2024-01-08 DIAGNOSIS — E7849 Other hyperlipidemia: Secondary | ICD-10-CM | POA: Diagnosis not present

## 2024-01-08 DIAGNOSIS — E039 Hypothyroidism, unspecified: Secondary | ICD-10-CM | POA: Diagnosis not present

## 2024-01-08 DIAGNOSIS — E1140 Type 2 diabetes mellitus with diabetic neuropathy, unspecified: Secondary | ICD-10-CM | POA: Diagnosis not present

## 2024-01-08 DIAGNOSIS — Z1212 Encounter for screening for malignant neoplasm of rectum: Secondary | ICD-10-CM | POA: Diagnosis not present

## 2024-01-08 DIAGNOSIS — K219 Gastro-esophageal reflux disease without esophagitis: Secondary | ICD-10-CM | POA: Diagnosis not present

## 2024-01-11 DIAGNOSIS — Z1212 Encounter for screening for malignant neoplasm of rectum: Secondary | ICD-10-CM | POA: Diagnosis not present

## 2024-01-11 DIAGNOSIS — E7849 Other hyperlipidemia: Secondary | ICD-10-CM | POA: Diagnosis not present

## 2024-01-11 DIAGNOSIS — E039 Hypothyroidism, unspecified: Secondary | ICD-10-CM | POA: Diagnosis not present

## 2024-01-12 DIAGNOSIS — M5416 Radiculopathy, lumbar region: Secondary | ICD-10-CM | POA: Diagnosis not present

## 2024-01-12 DIAGNOSIS — Z0000 Encounter for general adult medical examination without abnormal findings: Secondary | ICD-10-CM | POA: Diagnosis not present

## 2024-01-12 DIAGNOSIS — R600 Localized edema: Secondary | ICD-10-CM | POA: Diagnosis not present

## 2024-01-12 DIAGNOSIS — K8689 Other specified diseases of pancreas: Secondary | ICD-10-CM | POA: Diagnosis not present

## 2024-01-12 DIAGNOSIS — E039 Hypothyroidism, unspecified: Secondary | ICD-10-CM | POA: Diagnosis not present

## 2024-01-12 DIAGNOSIS — Z1331 Encounter for screening for depression: Secondary | ICD-10-CM | POA: Diagnosis not present

## 2024-01-12 DIAGNOSIS — E1140 Type 2 diabetes mellitus with diabetic neuropathy, unspecified: Secondary | ICD-10-CM | POA: Diagnosis not present

## 2024-01-12 DIAGNOSIS — I10 Essential (primary) hypertension: Secondary | ICD-10-CM | POA: Diagnosis not present

## 2024-01-12 DIAGNOSIS — R82998 Other abnormal findings in urine: Secondary | ICD-10-CM | POA: Diagnosis not present

## 2024-01-12 DIAGNOSIS — E785 Hyperlipidemia, unspecified: Secondary | ICD-10-CM | POA: Diagnosis not present

## 2024-01-12 DIAGNOSIS — Z23 Encounter for immunization: Secondary | ICD-10-CM | POA: Diagnosis not present

## 2024-01-12 DIAGNOSIS — Z1339 Encounter for screening examination for other mental health and behavioral disorders: Secondary | ICD-10-CM | POA: Diagnosis not present

## 2024-01-22 ENCOUNTER — Other Ambulatory Visit: Payer: Self-pay | Admitting: Obstetrics and Gynecology

## 2024-01-22 ENCOUNTER — Ambulatory Visit: Admitting: Physician Assistant

## 2024-01-30 NOTE — Progress Notes (Unsigned)
 01/31/2024 Tracy Hunt [id number] October 19, 1945  Referring provider: Janey Santos, MD Primary GI doctor: Dr. Federico  ASSESSMENT AND PLAN:  IBS-C with pelvic floor dysfunction/laxity January 2025 CT showed colon wall thickening with stranding in sigmoid colon and rectum, C. difficile and GI pathogen negative, presumed infectious versus ischemic 04/08/2023 flexible sigmoidoscopy found to have solitary sterile coral or rectal ulcer 12/14/2023 CTAP W no acute process sigmoid diverticulosis with moderate stool burden throughout colon labs  On miralax  and benefiber, completed flagyl  trial Has been having BM daily, denies nausea, less gas - Consider trial of Linzess 72 if symptoms persist. - Encourage pelvic floor exercises.  Rectal ulcer Rectal ulcer noted on flexible sigmoidoscopy in January, likely related to stool burden and mucosal pressure. No active inflammation or bleeding.  Diverticulosis of colon Diverticulosis noted on recent CT scan without diverticulitis. No symptoms of diverticulitis. Management focused on constipation to prevent complications  Pancreatic insufficiency On Creon  for pancreatic enzyme replacement. -Continue Creon  with meals, has significant help with this -Provide patient education on the use of Creon  with meals and snacks.  Personal history of colon polyps Last colonoscopy in 05/2019 showed 1 small polyp in the transverse colon that was removed and many large diverticuli in the sigmoid and descending colon.   Patient Care Team: Avva, Ravisankar, MD as PCP - General (Internal Medicine) Patel, Donika K, DO as Consulting Physician (Neurology)  HISTORY OF PRESENT ILLNESS: 78 y.o. female with a past medical history of pancreatic insufficiency IBS diverticulitis prior C. difficile, family history of colon cancer listed below presents for evaluation of AB pain.   I last saw the patient in the office 12/19/2023.  Patient had mild sed rate elevation  of 51 normal CBC kidney liver.  Given trial of metronidazole  250 mg 3 times daily for 10 days.  Discussed the use of AI scribe software for clinical note transcription with the patient, who gave verbal consent to proceed.  History of Present Illness   Tracy Hunt is a 78 year old female who presents for follow-up of gastrointestinal symptoms.  She has experienced significant improvement in her gastrointestinal symptoms since her last visit in September. Previously, she had nausea, gas, and discomfort, which she reports have improved with the current treatment regimen. No current nausea, and her gas is more controllable. She has a daily bowel movement and no rectal bleeding.  She completed a course of metronidazole  and has been using Benefiber and Miralax  daily. The combination of fiber and Miralax  has improved her bowel movements, allowing her to have a bowel movement within an hour after breakfast, which was not the case when she was using Miralax  alone.  She is currently taking Creon  with every meal and dicyclomine  with every meal. She is not currently on a probiotic.      She  reports that she has never smoked. She has never used smokeless tobacco. She reports that she does not currently use alcohol . She reports that she does not use drugs.  RELEVANT GI HISTORY, IMAGING AND LABS: Results         2021 Colon with Dr. Kristie 3 mm polyp and diverticulosis 05/19/2022 office visit with Dr. Aneita for follow-up of C. difficile colitis initially treated with vancomycin  but had ongoing fever treated with 10-day course of Dificid .  Given trial of dicyclomine . CT A/P w/contrast 04/05/23: IMPRESSION: 1. Moderate abnormal colon wall thickening in the descending colon with striking abnormal wall thickening in the sigmoid colon and  rectum, with substantial perirectal stranding and edema. This pattern of wall thickening has been present on some prior exams, most notably the CT scan 03/31/2022.  Although there is some atherosclerosis including plaque at the origin of the inferior mesenteric artery, the IMA is not overtly occluded or narrowed beyond this area of plaque, in the inferior mesenteric vein appears patent. It is conceivable that a hypotensive episode might reduce flow in the IMA enough to cause a transient ischemia that could lead to this distribution of inflammation, although this is speculative and there are no specific indicators of ischemia such as pneumatosis or Hunt venous gas. Other possibilities include recurrent infectious colitis; inflammatory bowel disease; or pseudomembranous colitis if the patient has been on recent antibiotic therapy. 2. Inflammatory findings in the perirectal region are associated with substantial presacral edema and potentially a trace amount of pelvic ascites. 3. Moderate sigmoid colon diverticulosis without focal indicator of acute diverticulitis. 4. Stable moderate cardiomegaly. 5. Small type 1 hiatal hernia. 6. Chronic right upper quadrant anterior abdominal wall hernia contains adipose tissue. 7. Improvement in prior pelvic floor laxity status post levator plication in September 2024. 8. S-shaped thoracolumbar scoliosis with associated grade 1 anterolisthesis at L4-5. Spondylosis and degenerative disc disease result in right foraminal impingement at L1-2, L2-3, and L5-S1; and left foraminal impingement at L3-4, L4-5, and especially L5-S1. 9. Aortic atherosclerosis.   Flex sig 04/08/23: - Diverticulosis in the sigmoid colon and in the descending colon. - Localized inflammation was found in the rectum, in the sigmoid colon and in the descending colon. Biopsied. - Non- bleeding internal hemorrhoids. - A single ( solitary) ulcer in the rectum. Biopsied. - Non- bleeding internal hemorrhoids. Path: A. COLON, BIOPSY:      Active colitis with marked reactive epithelial changes.      Negative for chronicity, granuloma, dysplasia or  malignancy.      See comment. B. RECTUM, ULCER, BIOPSY:      Active colitis with marked reactive epithelial changes.      Negative for chronicity, granuloma, dysplasia or malignancy.      See comment. COMMENT: The biopsies are composed of fragments of colonic mucosa with cryptitis with reactive epithelial changes. There is no evidence of chronicity including crypt architectural distortion, basilar lymphoplasmacytosis or metaplasia. The etiology considerations include infection, medication effect, diverticular disease associated colitis, ischemic colitis, et al. The possibility of inflammatory bowel disease is relatively low but cannot be entirely excluded. Clinical correlation is recommended.    CBC    Component Value Date/Time   WBC 3.7 (L) 12/19/2023 0949   RBC 4.13 12/19/2023 0949   HGB 13.0 12/19/2023 0949   HCT 39.3 12/19/2023 0949   PLT 159.0 12/19/2023 0949   MCV 95.0 12/19/2023 0949   MCH 32.3 12/14/2023 2043   MCHC 33.2 12/19/2023 0949   RDW 13.5 12/19/2023 0949   LYMPHSABS 1.3 12/19/2023 0949   MONOABS 0.4 12/19/2023 0949   EOSABS 0.2 12/19/2023 0949   BASOSABS 0.0 12/19/2023 0949   Recent Labs    02/14/23 1210 04/05/23 1232 04/06/23 0305 04/08/23 0706 06/05/23 1328 12/14/23 2043 12/19/23 0949  HGB 13.1 13.1 14.2 12.0 13.1 15.6* 13.0    CMP     Component Value Date/Time   NA 140 12/19/2023 0949   K 4.6 12/19/2023 0949   CL 102 12/19/2023 0949   CO2 32 12/19/2023 0949   GLUCOSE 103 (H) 12/19/2023 0949   BUN 13 12/19/2023 0949   CREATININE 0.81 12/19/2023 0949   CALCIUM  10.4  12/19/2023 0949   PROT 7.9 12/19/2023 0949   ALBUMIN 4.1 12/19/2023 0949   AST 17 12/19/2023 0949   ALT 11 12/19/2023 0949   ALKPHOS 73 12/19/2023 0949   BILITOT 0.5 12/19/2023 0949   GFRNONAA >60 12/14/2023 2043      Latest Ref Rng & Units 12/19/2023    9:49 AM 12/14/2023    8:43 PM 06/05/2023    1:28 PM  Hepatic Function  Total Protein 6.0 - 8.3 g/dL 7.9  9.1  7.6    Albumin 3.5 - 5.2 g/dL 4.1  4.7  3.6   AST 0 - 37 U/L 17  29  21    ALT 0 - 35 U/L 11  13  14    Alk Phosphatase 39 - 117 U/L 73  100  69   Total Bilirubin 0.2 - 1.2 mg/dL 0.5  0.6  0.6       Current Medications:   Current Outpatient Medications (Endocrine & Metabolic):    levothyroxine  (SYNTHROID ) 100 MCG tablet, Take 100 mcg by mouth daily before breakfast.  Current Outpatient Medications (Cardiovascular):    olmesartan  (BENICAR ) 20 MG tablet, Take 1 tablet (20 mg total) by mouth daily.   rosuvastatin  (CRESTOR ) 10 MG tablet, Take 10 mg by mouth at bedtime.   Current Outpatient Medications (Analgesics):    acetaminophen  (TYLENOL ) 650 MG CR tablet, Take 1,300 mg by mouth in the morning, at noon, and at bedtime. Takes 2 tablets in the morning, 2 in the afternoon, and 2 at bedtime.   traMADol  (ULTRAM ) 50 MG tablet, Take 1 tablet (50 mg total) by mouth every 6 (six) hours as needed.   Current Outpatient Medications (Other):    amoxicillin  (AMOXIL ) 500 MG tablet, Take 2,000 mg by mouth See admin instructions. Take prior to dental procedures   Cholecalciferol  (VITAMIN D ) 50 MCG (2000 UT) tablet, Take 2,000 Units by mouth daily.   COMBIGAN  0.2-0.5 % ophthalmic solution, Place 1 drop into both eyes in the morning and at bedtime.   dicyclomine  (BENTYL ) 10 MG capsule, TAKE 1 CAPSULE THREE TIMES DAILY BEFORE MEALS   docusate sodium  (COLACE) 100 MG capsule, Take 100 mg by mouth daily.   estradiol  (ESTRACE ) 0.1 MG/GM vaginal cream, Place 0.5 g vaginally 2 (two) times a week. Place 0.5g nightly for two weeks then twice a week after (Patient taking differently: Place 0.5 g vaginally 2 (two) times a week.)   gabapentin  (NEURONTIN ) 100 MG capsule, Take 2 tablets at bedtime along with 300mg  tablets. (Patient taking differently: Take 100 mg by mouth at bedtime.)   gabapentin  (NEURONTIN ) 300 MG capsule, Take 1 capsule (300 mg total) by mouth 3 (three) times daily.   hydrocortisone (ANUSOL-HC) 2.5 %  rectal cream, Place 1 Application rectally as needed for hemorrhoids or anal itching.   lipase/protease/amylase (CREON ) 36000 UNITS CPEP capsule, Take 2 capsules (72,000 Units total) by mouth 3 (three) times daily with meals.   Menthol , Topical Analgesic, (BIOFREEZE EX), Apply 1 drop topically daily as needed (back pain).   Multiple Vitamins-Minerals (CENTRUM SILVER 50+WOMEN PO), Take 1 tablet by mouth daily.   ondansetron  (ZOFRAN ) 4 MG tablet, Take 1 tablet (4 mg total) by mouth every 8 (eight) hours as needed for nausea or vomiting.   ondansetron  (ZOFRAN -ODT) 8 MG disintegrating tablet, 8mg  ODT q4 hours prn nausea   polyethylene glycol (MIRALAX  / GLYCOLAX ) 17 g packet, Take 17 g by mouth daily. (Patient taking differently: Take 17 g by mouth daily as needed (constipation).)   Propylene Glycol (  SYSTANE COMPLETE OP), Place 1 drop into both eyes every 6 (six) hours as needed (dry eyes).   saccharomyces boulardii (FLORASTOR) 250 MG capsule, Take 1 capsule (250 mg total) by mouth 2 (two) times daily. (Patient taking differently: Take 500 mg by mouth daily.)   Vibegron  (GEMTESA ) 75 MG TABS, TAKE 1 TABLET EVERY DAY  Medical History:  Past Medical History:  Diagnosis Date   Acid reflux    Anemia    Arthritis    Bell's palsy 1988   C. difficile diarrhea 03/2022   Complication of anesthesia    Hard to wake up after colonoscopy around 2000, but no issues with surgeries/colonoscopies after that   Diverticulitis    Dry eye    Family history of adverse reaction to anesthesia    mom hard to wake up and had lethargy after anesthesia   Glaucoma    Gout    Hepatitis 1986   secondary to varicella was in hospital for 2 days   Hiatal hernia    Hyperlipemia    Hypertension    Hypothyroidism    Malaria    as as child   Mumps    Pancreatic insufficiency    Per patient and her daughter   Pre-diabetes    Rosacea    Scoliosis    Sjogren's syndrome    Allergies:  Allergies  Allergen Reactions    Cosopt  [Dorzolamide  Hcl-Timolol  Mal] Other (See Comments)    Very irritated   Morphine     Makes me hyper   Other Itching    Adhesive tape  Paper tape is okay   Oyster Shell Hives, Diarrhea and Nausea And Vomiting    Oysters - cramping     Surgical History:  She  has a past surgical history that includes Abdominal hysterectomy (1995); Appendectomy (1995); Cholecystectomy (01/16/2012); steroid injections; Reverse shoulder arthroplasty (Left, 10/23/2020); Cataract extraction (Bilateral); Colpocleisis (N/A, 12/05/2022); Rectocele repair (N/A, 12/05/2022); Cystoscopy (N/A, 12/05/2022); Flexible sigmoidoscopy (N/A, 04/08/2023); biopsy (04/08/2023); Knee Arthroplasty (Left, 12/27/2010); Knee Arthroplasty (08/10/2015); Lumbar Radio Frequency Ablation (11/27/2012); Lumbar Radio Requency Ablation (02/05/2014); and XI robotic assisted ventral hernia (N/A, 06/13/2023). Family History:  Her family history includes Breast cancer in her paternal grandmother; CVA in her sister; Colon cancer in her mother; Dementia in her sister; Diabetes in her father; Hypertension in her mother; Lung cancer in her mother.  REVIEW OF SYSTEMS  : All other systems reviewed and negative except where noted in the History of Present Illness.  PHYSICAL EXAM: BP (!) 156/88   Pulse 66   Ht 5' 3 (1.6 m)   Wt 171 lb 6 oz (77.7 kg)   SpO2 98%   BMI 30.36 kg/m  Physical Exam   GENERAL APPEARANCE: Well nourished, in no apparent distress. HEENT: No cervical lymphadenopathy, unremarkable thyroid , sclerae anicteric, conjunctiva pink. RESPIRATORY: Respiratory effort normal, breath sounds equal bilateral without rales, rhonchi, wheezing. CARDIO: Regular rate and rhythm with no murmurs, rubs, or gallops, peripheral pulses intact. ABDOMEN: Soft, non-distended, active bowel sounds in all four quadrants, no tenderness to palpation, no rebound, no mass appreciated. RECTAL: Rectal exam normal. MUSCULOSKELETAL: Full range of motion,  normal gait, without edema. SKIN: Dry, intact without rashes or lesions. No jaundice. NEURO: Alert, oriented, no focal deficits. PSYCH: Cooperative, normal mood and affect.      Alan JONELLE Coombs, PA-C 3:49 PM

## 2024-01-31 ENCOUNTER — Ambulatory Visit (INDEPENDENT_AMBULATORY_CARE_PROVIDER_SITE_OTHER): Admitting: Physician Assistant

## 2024-01-31 VITALS — BP 156/88 | HR 66 | Ht 63.0 in | Wt 171.4 lb

## 2024-01-31 DIAGNOSIS — K8689 Other specified diseases of pancreas: Secondary | ICD-10-CM | POA: Diagnosis not present

## 2024-01-31 DIAGNOSIS — K5900 Constipation, unspecified: Secondary | ICD-10-CM | POA: Diagnosis not present

## 2024-01-31 DIAGNOSIS — K580 Irritable bowel syndrome with diarrhea: Secondary | ICD-10-CM | POA: Diagnosis not present

## 2024-01-31 DIAGNOSIS — K626 Ulcer of anus and rectum: Secondary | ICD-10-CM | POA: Diagnosis not present

## 2024-01-31 NOTE — Patient Instructions (Addendum)
 VISIT SUMMARY:  During your visit, we discussed the significant improvement in your gastrointestinal symptoms since your last visit. You have been managing well with your current treatment regimen, and we reviewed your ongoing medications and potential future adjustments.  YOUR PLAN:  CHRONIC CONSTIPATION WITH PELVIC FLOOR LAXITY: Your constipation has improved with your current regimen, and you are having daily bowel movements without significant discomfort. -Continue taking Miralax  once daily. -Continue your fiber supplementation. -Perform pelvic floor exercises regularly. -Consider Linzess if your symptoms worsen or your bowel habits change.  EXOCRINE PANCREATIC INSUFFICIENCY: Your condition is being managed well with Creon , which is essential for controlling your symptoms. -Continue taking Creon  with every meal indefinitely.  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 -Use dicyclomine  as needed, especially before social events or if your symptoms worsen. -Monitor for side effects such as dryness, dizziness, or fatigue.  Thank you for trusting me with your gastrointestinal care!   Alan Coombs, PA-C  _______________________________________________________  If your blood pressure at your visit was 140/90 or greater, please contact your primary care physician to follow up on this.  _______________________________________________________  If you are age 21 or older, your body mass index should be between 23-30. Your Body mass index is 30.36 kg/m. If this is out of the aforementioned range listed, please consider follow up with your Primary Care Provider.  If you are age 69 or younger, your body mass index should be between 19-25. Your Body mass index is 30.36 kg/m. If this is out of the aformentioned range listed, please consider follow up with your Primary Care Provider.    ________________________________________________________  The [facility] providers would like to encourage you to use MYCHART to communicate with providers for non-urgent requests or questions.  Due to long hold times on the telephone, sending your provider a message by [facility] may be a faster and more efficient way to get a response.  Please allow 48 business hours for a response.  Please remember that this is for non-urgent requests.  _______________________________________________________  [facility] is using a team-based approach to care.  Your team is made up of your doctor and two to three APPS. Our APPS (Nurse Practitioners and Physician Assistants) work with your physician to ensure care continuity for you. They are fully qualified to address your health concerns and develop a treatment plan. They communicate directly with your gastroenterologist to care for you. Seeing the Advanced Practice Practitioners on your physician's team can help you by facilitating care more promptly, often allowing for earlier appointments, access to diagnostic testing, procedures, and other specialty referrals.

## 2024-02-16 ENCOUNTER — Telehealth: Payer: Self-pay | Admitting: Gastroenterology

## 2024-02-16 NOTE — Telephone Encounter (Signed)
 Patient called at 9 PM this evening. States she is having a gout flare and has prednisone  at home given to her by her rheumatologist, and asks if she can take it from a GI perspective. She states she has 5mg  tablet and wants to take 5mg  tab tonight. 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 I told her if her bowels are okay at present I think okay to take her prednisone  for gout and keep an eye on her bowels, should not increase her risk for C diff much, especially at low dose. If she has concerning symptoms that bother her with her bowels moving forward, she will let us  know. She thanked me for the call and answering her questions.

## 2024-02-29 ENCOUNTER — Telehealth: Payer: Self-pay | Admitting: Gastroenterology

## 2024-02-29 NOTE — Telephone Encounter (Signed)
 Called patient , she reports yesterday she had chills, body aches and had nausea with vomiting 4 times. She reports she had lunch with cup of coffee. She reports she does not drink coffee often and thought it Isbella Arline have caused her to vomit. She laid down for a few hours and felt better. She reports thick phlegm and cough. She told her PCP- was told to take mucinex  and omeprazole which she has not done. She was afraid it would cause stomach upset.  No fever. She reports she had breakfast and lunch with friends today and seems to be fine.  She did have some questions she wanted to discuss with Alan, [place] when she is back as she feels she knows her history. She reports her son who is a physician had mentioned she see a endocrinologist for her pancreatic issues. Will relay this message to Howard, [place] for when she gets back.   Advised patient to let us  know if any more issues.

## 2024-03-05 ENCOUNTER — Encounter: Payer: Self-pay | Admitting: Obstetrics and Gynecology

## 2024-03-05 ENCOUNTER — Ambulatory Visit: Admitting: Obstetrics and Gynecology

## 2024-03-05 VITALS — BP 149/85 | HR 58

## 2024-03-05 DIAGNOSIS — N3281 Overactive bladder: Secondary | ICD-10-CM

## 2024-03-05 MED ORDER — GEMTESA 75 MG PO TABS: 1.0000 | ORAL_TABLET | Freq: Every day | ORAL | 3 refills | Status: AC

## 2024-03-05 NOTE — Progress Notes (Signed)
 [facility]  Date of Visit: 03/05/2024  History of Present Illness: [name] is a 78 y.o. female scheduled today for a post-operative visit.   Surgery: s/p Colpocleisis, cystoscopy, levator plication, perineorrhaphy on 12/05/22  Gemtesa  has been working well. Occasionally has an accident if she waits too long to urinate. Maybe happens less than once a week. Otherwise urgency is well controlled. She denies any vaginal bleeding or bulge.   She has had some GI issues in the last year- had C.diff and was hospitalized.    Medications: She has a current medication list which includes the following prescription(s): acetaminophen , amoxicillin , vitamin d , combigan , dicyclomine , docusate sodium , estradiol , gabapentin , gabapentin , hydrocortisone, levothyroxine , lipase/protease/amylase, menthol  (topical analgesic), multiple vitamins-minerals, olmesartan , ondansetron , ondansetron , polyethylene glycol, propylene glycol, rosuvastatin , saccharomyces boulardii, and gemtesa .   Allergies: Patient is allergic to cosopt  [dorzolamide  hcl-timolol  mal], morphine, other, and oyster shell.   Physical Exam: BP (!) 149/85   Pulse (!) 58    AAO x3.  Pelvic exam deferred  --------------------------------------  Assessment and Plan:  1. Overactive bladder     - Gemtesa  refilled for a year. She was given a few samples because price increases in the beginning of the year until she meets her deductible.   Return 1 year or sooner if needed  Rosaline LOISE Caper, MD  Time spent: I spent 20 minutes dedicated to the care of this patient on the date of this encounter to include pre-visit review of records, face-to-face time with the patient and post visit documentation.

## 2024-04-16 ENCOUNTER — Telehealth: Admitting: Neurology

## 2024-04-16 DIAGNOSIS — R202 Paresthesia of skin: Secondary | ICD-10-CM

## 2024-04-16 DIAGNOSIS — G609 Hereditary and idiopathic neuropathy, unspecified: Secondary | ICD-10-CM

## 2024-04-16 MED ORDER — GABAPENTIN 300 MG PO CAPS: 600.0000 mg | ORAL_CAPSULE | Freq: Three times a day (TID) | ORAL | 3 refills | Status: AC

## 2024-04-16 MED ORDER — GABAPENTIN 100 MG PO CAPS: ORAL_CAPSULE | ORAL | 3 refills | Status: AC

## 2024-04-16 NOTE — Progress Notes (Signed)
" ° °  Virtual Visit via Video Note The purpose of this virtual visit is to provide medical care while limiting exposure to the novel coronavirus.    Consent was obtained for video visit:  Yes.   Answered questions that patient had about telehealth interaction:  Yes.   I discussed the limitations, risks, security and privacy concerns of performing an evaluation and management service by telemedicine. I also discussed with the patient that there may be a patient responsible charge related to this service. The patient expressed understanding and agreed to proceed.  Pt location: Home Physician Location: office Name of referring provider:  Avva, Ravisankar, MD I connected with Dedra Belch Del Portal at patients initiation/request on 04/16/2024 at 10:30 AM EST by video enabled telemedicine application and verified that I am speaking with the correct person using two identifiers. Pt MRN:  [id number] Pt DOB:  06/18/1945 Video Participants:  Dedra Belch Del Portal   History of Present Illness: This is a 79 y.o. female returning for follow-up of peripheral neuropathy.  She continues to have bilateral feet pain, which is worse at night time. She takes gabapentin  600mg  three times day plus additional 100mg  at bedtime.  In addition, she takes tylenol  650mg  x 2 three times daily for achy pain and stiffness.  She is having worsening balance and reports one fall in her bedroom last year, with no injuries. She has noticed difficulty with standing as well as climbing stairs.  Occasionally, she has low back pain.     Observations/Objective:   There were no vitals filed for this visit. Patient is awake, alert, and appears comfortable.  Oriented x 4.   Extraocular muscles are intact. No ptosis.  Face is symmetric. Speech is not dysarthric.  Antigravity in all extremities. Gait appears mildly wide-based, unassisted.    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  Risk factors:  Sjogren's disease, age.  She reports worsening imbalance and pain.  She is already on a moderately high dose of gabapentin  (600mg  TID + 100mg  at bedtime) and reports sedation, so do no recommend increasing the dose.   I will start her with physical therapy for balance training.  Fall precautions discussed.   Follow Up Instructions:   I discussed the assessment and treatment plan with the patient. The patient was provided an opportunity to ask questions and all were answered. The patient agreed with the plan and demonstrated an understanding of the instructions.   The patient was advised to call back or seek an in-person evaluation if the symptoms worsen or if the condition fails to improve as anticipated.  Follow-up in 1 year, or sooner as needed    Audelia Knape K Tonimarie Gritz, DO  "

## 2024-04-19 ENCOUNTER — Other Ambulatory Visit: Payer: Self-pay | Admitting: Internal Medicine

## 2024-04-19 DIAGNOSIS — Z1231 Encounter for screening mammogram for malignant neoplasm of breast: Secondary | ICD-10-CM

## 2024-04-29 ENCOUNTER — Ambulatory Visit

## 2024-04-30 ENCOUNTER — Ambulatory Visit: Payer: Medicare HMO | Admitting: Neurology

## 2024-06-03 ENCOUNTER — Ambulatory Visit

## 2025-04-21 ENCOUNTER — Ambulatory Visit: Payer: Self-pay | Admitting: Neurology
